# Patient Record
Sex: Male | Born: 1970 | Race: Black or African American | Hispanic: No | Marital: Single
Health system: Midwestern US, Community
[De-identification: ages and names within clinical notes are randomized; demographics above are authoritative.]

## PROBLEM LIST (undated history)

## (undated) DIAGNOSIS — M5416 Radiculopathy, lumbar region: Secondary | ICD-10-CM

## (undated) DIAGNOSIS — R1013 Epigastric pain: Secondary | ICD-10-CM

---

## 2015-06-29 DIAGNOSIS — S20212A Contusion of left front wall of thorax, initial encounter: Secondary | ICD-10-CM
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
## 2015-06-30 ENCOUNTER — Inpatient Hospital Stay
Admit: 2015-06-30 | Discharge: 2015-06-30 | Disposition: A | Discharge status: Home / Self Care | Payer: PRIVATE HEALTH INSURANCE | Attending: Emergency Medicine

## 2015-06-30 ENCOUNTER — Emergency Department: Admit: 2015-06-30 | Payer: PRIVATE HEALTH INSURANCE | Primary: Family Medicine

## 2015-06-30 MED ORDER — NALBUPHINE 10 MG/ML INJECTION
10 mg/mL | INTRAMUSCULAR | Status: DC
Start: 2015-06-30 — End: 2015-06-30

## 2015-06-30 MED ORDER — TRAMADOL 50 MG TAB
50 mg | ORAL | Status: DC
Start: 2015-06-30 — End: 2015-06-30

## 2015-06-30 MED ORDER — TRAMADOL 50 MG TAB
50 mg | ORAL_TABLET | Freq: Four times a day (QID) | ORAL | 0 refills | Status: DC | PRN
Start: 2015-06-30 — End: 2017-07-24

## 2015-06-30 MED FILL — NALBUPHINE 10 MG/ML INJECTION: 10 mg/mL | INTRAMUSCULAR | Qty: 1

## 2015-06-30 NOTE — ED Notes (Signed)
Pt. Refusing nubain and tramadol
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
## 2015-06-30 NOTE — ED Notes (Signed)
I have reviewed discharge instructions with the patient.  The patient verbalized understanding. Patient armband removed and shredded Pt. Refusing tramadol RX, requesting Percocets instead. Dr. Bertram GalaAnglin at bedside, aware of situation. Pt. Walked independently out of ED with discharge papers.

## 2015-07-07 ENCOUNTER — Emergency Department: Admit: 2015-07-07 | Payer: Self-pay | Primary: Family Medicine

## 2015-07-07 ENCOUNTER — Inpatient Hospital Stay
Admit: 2015-07-07 | Discharge: 2015-07-07 | Disposition: A | Discharge status: Home / Self Care | Payer: Self-pay | Attending: Emergency Medicine

## 2015-07-07 DIAGNOSIS — M545 Low back pain: Secondary | ICD-10-CM

## 2015-07-07 LAB — URINE MICROSCOPIC ONLY
RBC: 0 /hpf (ref 0–5)
WBC: 0 /hpf (ref 0–4)

## 2015-07-07 LAB — CBC WITH AUTOMATED DIFF
ABS. BASOPHILS: 0 10*3/uL (ref 0.0–0.1)
ABS. EOSINOPHILS: 0.1 10*3/uL (ref 0.0–0.4)
ABS. LYMPHOCYTES: 3.2 10*3/uL (ref 0.9–3.6)
ABS. MONOCYTES: 0.6 10*3/uL (ref 0.05–1.2)
ABS. NEUTROPHILS: 3.8 10*3/uL (ref 1.8–8.0)
BASOPHILS: 0 % (ref 0–2)
EOSINOPHILS: 2 % (ref 0–5)
HCT: 40.9 % (ref 36.0–48.0)
HGB: 13.8 g/dL (ref 13.0–16.0)
LYMPHOCYTES: 42 % (ref 21–52)
MCH: 29.5 PG (ref 24.0–34.0)
MCHC: 33.7 g/dL (ref 31.0–37.0)
MCV: 87.4 FL (ref 74.0–97.0)
MONOCYTES: 7 % (ref 3–10)
MPV: 9.3 FL (ref 9.2–11.8)
NEUTROPHILS: 49 % (ref 40–73)
PLATELET: 253 10*3/uL (ref 135–420)
RBC: 4.68 M/uL — ABNORMAL LOW (ref 4.70–5.50)
RDW: 14.7 % — ABNORMAL HIGH (ref 11.6–14.5)
WBC: 7.7 10*3/uL (ref 4.6–13.2)

## 2015-07-07 LAB — METABOLIC PANEL, BASIC
Anion gap: 7 mmol/L (ref 3.0–18)
BUN/Creatinine ratio: 10 — ABNORMAL LOW (ref 12–20)
BUN: 11 MG/DL (ref 7.0–18)
CO2: 27 mmol/L (ref 21–32)
Calcium: 8.5 MG/DL (ref 8.5–10.1)
Chloride: 107 mmol/L (ref 100–108)
Creatinine: 1.09 MG/DL (ref 0.6–1.3)
GFR est AA: >60 mL/min/{1.73_m2} (ref >60)
GFR est non-AA: >60 mL/min/{1.73_m2} (ref >60)
Glucose: 101 mg/dL — ABNORMAL HIGH (ref 74–99)
Potassium: 3.7 mmol/L (ref 3.5–5.5)
Sodium: 141 mmol/L (ref 136–145)

## 2015-07-07 LAB — URINALYSIS W/ RFLX MICROSCOPIC
Bilirubin: NEGATIVE
Glucose: NEGATIVE mg/dL
Nitrites: NEGATIVE
Protein: NEGATIVE mg/dL
Specific gravity: 1.03 (ref 1.005–1.030)
Urobilinogen: 1 EU/dL (ref 0.2–1.0)
pH (UA): 5 (ref 5.0–8.0)

## 2015-07-07 MED ORDER — CIPROFLOXACIN 500 MG TAB
500 mg | ORAL_TABLET | Freq: Two times a day (BID) | ORAL | 0 refills | Status: AC
Start: 2015-07-07 — End: 2015-07-14
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
## 2015-07-07 NOTE — ED Notes (Signed)
I have reviewed discharge instructions with the patient.  The patient verbalized understanding.  Patient armband removed and shredded.  Pt is ambulatory with no acute distress noted at this time, the patient is alert, oriented and stable at time of discharge. Vital Signs stable.  Patient is being discharged with 1 prescription at this time.
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
## 2015-07-07 NOTE — ED Notes (Signed)
Pt resting on stretcher with eyes closed. No acute distress noted.  Vital signs stable.Will continue to monitor.

## 2015-07-07 NOTE — ED Notes (Signed)
Patient refused to have MRI done today, "I need to pick my daughter up at 5 am" Risks and Benefits explained to patient, patient verbalized understanding and signed informed refusal form. MRI order will be given to patient along with discharge instructions so that he can schedule the MRI A.S.A.P per provider.

## 2015-07-08 LAB — CULTURE, URINE
Culture result:: NO GROWTH
Culture: NO GROWTH

## 2017-07-24 DIAGNOSIS — T405X2A Poisoning by cocaine, intentional self-harm, initial encounter: Secondary | ICD-10-CM

## 2017-07-24 NOTE — ED Notes (Signed)
Pt C/O of back pain in center and left upper side. States it has had it for awhile but has gotten worse. He states he is having a bladder procedure soon, could not remember name of procedure. States they saw abnormalities on MRI. Pt states urinary frequency as well seeing some blood in his urine. Pt stated he had information that was sensitive and then stated he had had a previous attempt of overdosing on Heroin. Pt then said he had thoughts of punching people when asking if he had thoughts of harming others. Pt states he has been under stress and became tearful. Dr. Clerance LavBlanford aware. Pt in low bed and on BP and Pulse Ox monitors.
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
## 2017-07-24 NOTE — ED Triage Notes (Signed)
Pt C/O of back pain in center and left upper side. States it has had it for awhile but has gotten worse. He states he is having a bladder procedure soon, could not remember name of procedure. States they saw abnormalities on MRI. Pt states urinary frequency as well seeing some blood in his urine. Pt stated he had information that was sensitive and then stated he had had a previous attempt of overdosing on Heroin. Pt then said he had thoughts of punching people when asking if he had thoughts of harming others. Pt states he has been under stress and became tearful. Dr. Blanford aware. Pt in low bed and on BP and Pulse Ox monitors.
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
## 2017-07-25 ENCOUNTER — Inpatient Hospital Stay
Admit: 2017-07-25 | Discharge: 2017-07-25 | Disposition: A | Discharge status: Other Acute Inpatient Hospital | Payer: PRIVATE HEALTH INSURANCE | Attending: Emergency Medicine

## 2017-07-25 LAB — CBC WITH AUTOMATED DIFF
BASOPHILS: 0.5 % (ref 0–3)
EOSINOPHILS: 1.9 % (ref 0–5)
HCT: 47.7 % (ref 37.0–50.0)
HGB: 15.8 gm/dl (ref 12.4–17.2)
IMMATURE GRANULOCYTES: 0.1 % (ref 0.0–3.0)
LYMPHOCYTES: 48.6 % — ABNORMAL HIGH (ref 28–48)
MCH: 29.2 pg (ref 23.0–34.6)
MCHC: 33.1 gm/dl (ref 30.0–36.0)
MCV: 88 fL (ref 80.0–98.0)
MONOCYTES: 4.9 % (ref 1–13)
MPV: 9.1 fL (ref 6.0–10.0)
NEUTROPHILS: 44 % (ref 34–64)
NRBC: 0 (ref 0–0)
PLATELET: 232 10*3/uL (ref 140–450)
RBC: 5.42 M/uL (ref 3.80–5.70)
RDW-SD: 47.8 — ABNORMAL HIGH (ref 35.1–43.9)
WBC: 7.5 10*3/uL (ref 4.0–11.0)

## 2017-07-25 LAB — METABOLIC PANEL, BASIC
Anion gap: 8 mmol/L (ref 5–15)
BUN: 12 mg/dl (ref 7–25)
CO2: 28 mEq/L (ref 21–32)
Calcium: 9 mg/dl (ref 8.5–10.1)
Chloride: 106 mEq/L (ref 98–107)
Creatinine: 1.3 mg/dl (ref 0.6–1.3)
GFR est AA: >60
GFR est non-AA: >60
Glucose: 91 mg/dl (ref 74–106)
Potassium: 4.1 mEq/L (ref 3.5–5.1)
Sodium: 141 mEq/L (ref 136–145)

## 2017-07-25 LAB — DRUG SCREEN, URINE
Amphetamine: NEGATIVE
Amphetamines: NEGATIVE
Barbiturates: NEGATIVE
Barbiturates: NEGATIVE
Benzodiazapines: NEGATIVE
Benzodiazepines: NEGATIVE
Cocaine: NEGATIVE
Cocaine: NEGATIVE
Marijuana: POSITIVE — AB
Marijuana: POSITIVE — AB
Methadone: NEGATIVE
Methadone: NEGATIVE
Opiates: NEGATIVE
Opiates: NEGATIVE
Phencyclidine: NEGATIVE
Phencyclidine: NEGATIVE

## 2017-07-25 LAB — POC URINE MACROSCOPIC
Bilirubin, Urine: NEGATIVE
Bilirubin: NEGATIVE
Glucose, Ur: NEGATIVE mg/dl
Glucose: NEGATIVE mg/dl
Leukocyte Esterase, Urine: NEGATIVE
Leukocyte Esterase: NEGATIVE
Nitrite, Urine: NEGATIVE
Nitrites: NEGATIVE
Protein, UA: NEGATIVE mg/dl
Protein: NEGATIVE mg/dl
Specific Gravity, UA: 1.025 (ref 1.005–1.030)
Specific gravity: 1.025 (ref 1.005–1.030)
Urobilinogen, UA, POCT: 0.2 EU/dl (ref 0.0–1.0)
Urobilinogen: 0.2 EU/dl (ref 0.0–1.0)
pH (UA): 5.5 (ref 5–9)
pH, UA: 5.5 (ref 5–9)

## 2017-07-25 LAB — EKG, 12 LEAD, INITIAL
Atrial Rate: 58 {beats}/min
Calculated P Axis: 26 degrees
Calculated R Axis: -12 degrees
Calculated T Axis: -2 degrees
P-R Interval: 160 ms
Q-T Interval: 412 ms
QRS Duration: 90 ms
QTC Calculation (Bezet): 404 ms
Ventricular Rate: 58 {beats}/min

## 2017-07-25 LAB — SALICYLATE
Salicyclic Acid: 1.9 mg/dl — ABNORMAL LOW (ref 2.8–20.0)
Salicylate: 1.9 mg/dl — ABNORMAL LOW (ref 2.8–20.0)

## 2017-07-25 LAB — ETHYL ALCOHOL
ALCOHOL(ETHYL),SERUM: 4 mg/dl (ref 0.0–9.0)
Ethyl Alcohol: 4 mg/dl (ref 0.0–9.0)

## 2017-07-25 LAB — ACETAMINOPHEN: Acetaminophen level: <2 ug/mL — ABNORMAL LOW (ref 10.0–30.0)

## 2017-07-25 LAB — TSH 3RD GENERATION
TSH: 4.29 u[IU]/mL — ABNORMAL HIGH (ref 0.358–3.740)
TSH: 4.29 u[IU]/mL — ABNORMAL HIGH (ref 0.358–3.740)

## 2017-07-25 LAB — T4, FREE
FREE T4, FT4T: 0.78 ng/dl (ref 0.76–1.46)
Free T4: 0.78 ng/dl (ref 0.76–1.46)

## 2017-07-25 LAB — CBC WITH AUTO DIFFERENTIAL
Basophils %: 0.5 % (ref 0–3)
Eosinophils %: 1.9 % (ref 0–5)
Hematocrit: 47.7 % (ref 37.0–50.0)
Hemoglobin: 15.8 gm/dl (ref 12.4–17.2)
Immature Granulocytes: 0.1 % (ref 0.0–3.0)
Lymphocytes %: 48.6 % — ABNORMAL HIGH (ref 28–48)
MCH: 29.2 pg (ref 23.0–34.6)
MCHC: 33.1 gm/dl (ref 30.0–36.0)
MCV: 88 fL (ref 80.0–98.0)
MPV: 9.1 fL (ref 6.0–10.0)
Monocytes %: 4.9 % (ref 1–13)
Neutrophils %: 44 % (ref 34–64)
Nucleated RBCs: 0 (ref 0–0)
Platelets: 232 10*3/uL (ref 140–450)
RBC: 5.42 M/uL (ref 3.80–5.70)
RDW-SD: 47.8 — ABNORMAL HIGH (ref 35.1–43.9)
WBC: 7.5 10*3/uL (ref 4.0–11.0)

## 2017-07-25 LAB — EKG 12-LEAD
Atrial Rate: 58 {beats}/min
P Axis: 26 degrees
P-R Interval: 160 ms
Q-T Interval: 412 ms
QRS Duration: 90 ms
QTc Calculation (Bazett): 404 ms
R Axis: -12 degrees
T Axis: -2 degrees
Ventricular Rate: 58 {beats}/min

## 2017-07-25 LAB — BASIC METABOLIC PANEL
Anion Gap: 8 mmol/L (ref 5–15)
BUN: 12 mg/dl (ref 7–25)
CO2: 28 mEq/L (ref 21–32)
Calcium: 9 mg/dl (ref 8.5–10.1)
Chloride: 106 mEq/L (ref 98–107)
Creatinine: 1.3 mg/dl (ref 0.6–1.3)
EGFR IF NonAfrican American: >60
GFR African American: >60
Glucose: 91 mg/dl (ref 74–106)
Potassium: 4.1 mEq/L (ref 3.5–5.1)
Sodium: 141 mEq/L (ref 136–145)

## 2017-07-25 LAB — ACETAMINOPHEN LEVEL: Acetaminophen Level: <2 ug/mL — ABNORMAL LOW (ref 10.0–30.0)

## 2017-07-25 MED ORDER — KETOROLAC TROMETHAMINE 30 MG/ML INJECTION
30 mg/mL (1 mL) | INTRAMUSCULAR | Status: AC
Start: 2017-07-25 — End: 2017-07-25
  Administered 2017-07-25: 07:00:00 via INTRAVENOUS

## 2017-07-25 MED FILL — KETOROLAC TROMETHAMINE 30 MG/ML INJECTION: 30 mg/mL (1 mL) | INTRAMUSCULAR | Qty: 1

## 2017-07-25 NOTE — Progress Notes (Signed)
MENTAL HEALTH UPDATE: [facility] the care of Dr. Milly Jakobunninghman. The number to call report is [phone]. ER Attending Dr. Raynald KempHsu and nursing staff notified.

## 2017-07-25 NOTE — ED Notes (Signed)
Dr. Dorthula RueSawhney called for information about pt. Pt in low bed and on monitors.

## 2017-07-25 NOTE — ED Notes (Signed)
12:20 PM  07/25/17     Discharge instructions given to patient (name) with verbalization of understanding. Patient accompanied by medical transport.  Patient discharged with the following prescriptions (none).  Patient discharged and taken to [facility] via medical transport.     Teofilo Pod, RN

## 2017-07-25 NOTE — ED Notes (Signed)
[facility] telpsych doctor [facility] as [facility] have recommended involuntary placement for patient due to SI/HI.

## 2017-07-25 NOTE — ED Notes (Signed)
Patient belongings have been inventoried by security and placed in locker in west wing labeled for bed 27.

## 2017-07-25 NOTE — ED Notes (Signed)
SBAR given to Nash-Finch CompanyLynn, Charity fundraiserN. Pt in low bed and on monitors.

## 2017-07-25 NOTE — Progress Notes (Signed)
[facility]: 47 YO AA Male presents to the ED with complaints of lower back/leg pain. During the medical evaluation, Pt reported he has had ongoing low back pain with right-sided sciatica since June of 2017. He reported feeling depressed because of the chronic pain and admitted to a recent suicide attempt by overdosing on heroin and cocaine. Pt reported the depression has gotten worse as his suicide attempt was unsuccessful. Psych consult requested and telepsych was utilized as no crisis clinician was on staff overnight. Plan: Case discussed with ER Attending Dr. Delton SeeNelson. Tele psychiatrist Dr. Zada GirtVictor Sawhney evaluated the Pt and recommended involuntary in-patient psychiatric hospitalization to treat the Pt for SI/HI. [facility] clinician Collier SalinaLolita Lee contacted to complete a pre-screen to determine if a TDO would be supported, but that was not completed prior to the start of this writer's shift as the daytime crisis clinician. This Clinical research associatewriter spoke with [name] to discuss the tele psychiatric's recommendation for in-pt psychiatric hospitalization for acute psychiatric stabilization, further psychiatric observation, evaluation, and treatment. Pt reports being voluntary for such treatment at this time. This Clinical research associatewriter to contact area facilities in an attempt to locate an appropriate placement for this voluntary referral. Pt to remain in the ED until transferred to a higher level of care. Continue video monitoring and periodic checks for safety. ER Attending notified and in agreement with plan.

## 2017-07-25 NOTE — ED Notes (Signed)
Report given to Megan RN

## 2017-07-25 NOTE — ED Notes (Signed)
Assumed care, patient is alert and awake, watching tv, requested coffee and given to him.  Helmut Musterlicia with mental health just finished assesing patient.

## 2017-07-25 NOTE — ED Notes (Signed)
11:05 AM  07/25/17     Attempted to call report to Lawanna KobusAngel, RN at [facility] ([facility]urse) who is unable to take report at this time, and asked that I call back in ten minutes.  Will follow up shortly; Addison BaileyStephanie Parker, RN, CN aware         Teofilo PodMegan Kinlaw, RN

## 2017-07-25 NOTE — ED Notes (Signed)
Pt requesting pillow. Pt provided pillow. Pt in low bed and on monitors.

## 2017-07-25 NOTE — ED Notes (Signed)
Patient has been changed into paper gown and security has been notified for belongings. telepsych has been arranged, paper faxed, and machine is set up outside of patient's room.

## 2017-07-25 NOTE — ED Notes (Signed)
 TRANSFER - OUT REPORT:    Verbal report given to [facility], Charity fundraiser (name) on PRATHER FAILLA  being transferred to [facility] (unit) for routine progression of care       Report consisted of patient's Situation, Background, Assessment and   Recommendations(SBAR).     Information from the following report(s) SBAR was reviewed with the receiving nurse.    Lines:   Peripheral IV 07/24/17 Right Antecubital (Active)   Site Assessment Clean, dry, & intact 07/24/2017 11:39 PM   Phlebitis Assessment 0 07/24/2017 11:39 PM   Infiltration Assessment 0 07/24/2017 11:39 PM   Dressing Status Clean, dry, & intact 07/24/2017 11:39 PM   Dressing Type Transparent 07/24/2017 11:39 PM   Alcohol Cap Used Yes 07/24/2017 11:39 PM        Opportunity for questions and clarification was provided.      Patient transported with:   Medical Transport upon their arrival

## 2017-07-25 NOTE — ED Notes (Signed)
[facility] by Dr Tomasa Randunningham.  No further issues.  Dianna RossettiHelen Idonia Zollinger MD

## 2017-07-25 NOTE — ED Notes (Signed)
Pt talking to Dr. Dorthula RueSawhney at this time via video from computer. Pt in low bed and on monitors.

## 2017-07-25 NOTE — ED Notes (Signed)
I accepted the patient at change of shift from Dr. Luciano CutterHahn and physician assistant Vonna KotykKristen K Blandford.  The patient has been medically cleared by my colleagues for psychiatric treatment.  We got a telemetry psych consult back at 5:00 in the morning.  They recommend involuntary commitment.  Up to this point the patient has been voluntary however.  Further recommendations include Seroquel 50 mg p.o. nightly for mood stabilization, anxiety, sleep, and appetite symptoms.  They have also recommended Zyprexa 5 mg p.o./IM every 8 hours as needed acute agitation/acute psychosis.  Psychiatry also request TSH and free T4.  Additionally, they recommend lipid panel and hemoglobin A1c.  I do not feel that either of these are warranted in the emergency room management of this patient.      Our crisis clinician will assist in the morning with bed search.  Care of the patient is being turned over to my colleague, Dr. Dianna RossettiHelen Hsu at this time.    6:34 AM  Telemetry psychiatry also requested a baseline EKG.  I reviewed the EKG myself.  It shows sinus bradycardia with a rate of 49.  Peer intervals 164 ms.  QRS is narrow complex at 92 ms.  QT interval corrected is normal at 383 ms.  I do not see any acute ST segment elevation.  Nonspecific T wave changes inferiorly in leads III and aVF.

## 2017-07-25 NOTE — ED Notes (Signed)
Bedside shift change report given to Larita FifeLynn (Cabin crewoncoming nurse) by Chip BoerVicki (offgoing nurse). Report included the following information SBAR.

## 2017-07-25 NOTE — ED Notes (Signed)
Dr. Sawhney called for information about pt. Pt in low bed and on monitors.

## 2017-07-25 NOTE — ED Notes (Signed)
[facility] accepted to [facility] by Dr Cunningham.  No further issues.  Lashala Laser MD

## 2017-07-25 NOTE — ED Notes (Signed)
Report from telpsych doctor given to Lolita from [facility] as they have recommended involuntary placement for patient due to SI/HI.

## 2017-07-25 NOTE — Progress Notes (Signed)
MENTAL HEALTH UPDATE: Pt has been accepted to [facility] under the care of Dr. Cunninghman. The number to call report is [phone]. ER Attending Dr. Hsu and nursing staff notified.

## 2017-07-25 NOTE — ED Notes (Signed)
Pt talking to Dr. Sawhney at this time via video from computer. Pt in low bed and on monitors.

## 2017-07-25 NOTE — ED Notes (Signed)
12:20 PM  07/25/17     Discharge instructions given to patient (name) with verbalization of understanding. Patient accompanied by medical transport.  Patient discharged with the following prescriptions (none).  Patient discharged and taken to [facility] via medical transport.     Megan Kinlaw, RN

## 2017-07-25 NOTE — ED Notes (Signed)
11:05 AM  07/25/17     Attempted to call report to Angel, RN at [facility] (nurse) who is unable to take report at this time, and asked that I call back in ten minutes.  Will follow up shortly; Stephanie Parker, RN, CN aware         Megan Kinlaw, RN

## 2017-07-25 NOTE — ED Notes (Signed)
TRANSFER - OUT REPORT:    Verbal report given to Angel, RN (name) on Jim Shaw  being transferred to [facility] (unit) for routine progression of care       Report consisted of patient???s Situation, Background, Assessment and   Recommendations(SBAR).     Information from the following report(s) SBAR was reviewed with the receiving nurse.    Lines:   Peripheral IV 07/24/17 Right Antecubital (Active)   Site Assessment Clean, dry, & intact 07/24/2017 11:39 PM   Phlebitis Assessment 0 07/24/2017 11:39 PM   Infiltration Assessment 0 07/24/2017 11:39 PM   Dressing Status Clean, dry, & intact 07/24/2017 11:39 PM   Dressing Type Transparent 07/24/2017 11:39 PM   Alcohol Cap Used Yes 07/24/2017 11:39 PM        Opportunity for questions and clarification was provided.      Patient transported with:   Medical Transport upon their arrival

## 2017-07-25 NOTE — ED Notes (Signed)
SBAR given to Lynn, RN. Pt in low bed and on monitors.

## 2017-07-25 NOTE — ED Notes (Signed)
Pt requesting pillow. Pt provided pillow. Pt in low bed and on monitors.

## 2017-07-25 NOTE — ED Notes (Addendum)
I accepted the patient at change of shift from Dr. Hahn and physician assistant Kristen K Blandford.  The patient has been medically cleared by my colleagues for psychiatric treatment.  We got a telemetry psych consult back at 5:00 in the morning.  They recommend involuntary commitment.  Up to this point the patient has been voluntary however.  Further recommendations include Seroquel 50 mg p.o. nightly for mood stabilization, anxiety, sleep, and appetite symptoms.  They have also recommended Zyprexa 5 mg p.o./IM every 8 hours as needed acute agitation/acute psychosis.  Psychiatry also request TSH and free T4.  Additionally, they recommend lipid panel and hemoglobin A1c.  I do not feel that either of these are warranted in the emergency room management of this patient.      Our crisis clinician will assist in the morning with bed search.  Care of the patient is being turned over to my colleague, Dr. Helen Hsu at this time.    6:34 AM  Telemetry psychiatry also requested a baseline EKG.  I reviewed the EKG myself.  It shows sinus bradycardia with a rate of 49.  Peer intervals 164 ms.  QRS is narrow complex at 92 ms.  QT interval corrected is normal at 383 ms.  I do not see any acute ST segment elevation.  Nonspecific T wave changes inferiorly in leads III and aVF.

## 2017-07-25 NOTE — Progress Notes (Signed)
[facility] ER CRISIS MENTAL HEALTH: 47 YO AA Male presents to the ED with complaints of lower back/leg pain. During the medical evaluation, Pt reported he has had ongoing low back pain with right-sided sciatica since June of 2017. He reported feeling depressed because of the chronic pain and admitted to a recent suicide attempt by overdosing on heroin and cocaine. Pt reported the depression has gotten worse as his suicide attempt was unsuccessful. Psych consult requested and telepsych was utilized as no crisis clinician was on staff overnight. Plan: Case discussed with ER Attending Dr. Nelson. Tele psychiatrist Dr. Victor Sawhney evaluated the Pt and recommended involuntary in-patient psychiatric hospitalization to treat the Pt for SI/HI. [facility] clinician Lolita Lee contacted to complete a pre-screen to determine if a TDO would be supported, but that was not completed prior to the start of this writer's shift as the daytime crisis clinician. This writer spoke with Mr. Philipson to discuss the tele psychiatric's recommendation for in-pt psychiatric hospitalization for acute psychiatric stabilization, further psychiatric observation, evaluation, and treatment. Pt reports being voluntary for such treatment at this time. This writer to contact area facilities in an attempt to locate an appropriate placement for this voluntary referral. Pt to remain in the ED until transferred to a higher level of care. Continue video monitoring and periodic checks for safety. ER Attending notified and in agreement with plan.

## 2017-07-25 NOTE — ED Notes (Signed)
Patient has been changed into paper gown and security has been notified for belongings. telepsych has been arranged, paper faxed, and machine is set up outside of patient's room.

## 2017-07-25 NOTE — ED Notes (Signed)
Assumed care, patient is alert and awake, watching tv, requested coffee and given to him.  Alicia with mental health just finished assesing patient.

## 2017-07-25 NOTE — ED Notes (Signed)
Patient belongings have been inventoried by security and placed in locker in west wing labeled for bed 27.

## 2017-07-25 NOTE — ED Notes (Signed)
Bedside shift change report given to Lynn (oncoming nurse) by Vicki (offgoing nurse). Report included the following information SBAR.

## 2017-08-11 DIAGNOSIS — F329 Major depressive disorder, single episode, unspecified: Secondary | ICD-10-CM

## 2017-08-11 NOTE — ED Provider Notes (Signed)
[facility]  Emergency Department Treatment Report        Patient: Jim Shaw Age: 47 y.o. Sex: male    Date of Birth: [birth year] Admit Date: 08/11/2017 PCP: None   MRN: [id number]  CSN: [id number]     Room: ER12/ER12 Time Dictated: 8:50 PM      Attending MD: Smitty Cords, MD  APC:  Vonna Kotyk, NP-C    Chief Complaint   Chief Complaint   Patient presents with   ??? Back Pain   ??? Urinary Frequency   ??? Mental Health Problem       History of Present Illness   47 y.o. male presented to the emergency room with concern for chronic back pain, chronic urinary urgency.  States that these have been going on since 2017 when he had a back injury at work.  He has not been able to work since.  He states that he is currently living with a friend stenosis to live in his truck.  He states that he is been outside a lot recently and is feeling dehydrated and sometimes is seeing a white spots in his vision.  No headaches.  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  He went to [facility].  He is requesting to return there because he feels that he is overwhelmed with her life, does not wish to be alive.  He is currently voluntary.  Denies drug or alcohol use today.      Review of Systems   Review of Systems   Constitutional: Positive for malaise/fatigue. Negative for chills and fever.   HENT: Negative for congestion, ear pain and sore throat.    Eyes:        Seeing white spots occasionally   Respiratory: Negative for cough, shortness of breath and wheezing.    Cardiovascular: Negative for chest pain and palpitations.   Gastrointestinal: Negative for abdominal pain, constipation, diarrhea, nausea and vomiting.   Genitourinary: Positive for frequency. Negative for flank pain.   Musculoskeletal: Positive for back pain. Negative for myalgias.        Chronic     Skin: Negative for  itching and rash.   Neurological: Positive for focal weakness. Negative for headaches.        Chronic right leg weakness   Psychiatric/Behavioral: Positive for depression and suicidal ideas.       Past Medical/Surgical History     Past Medical History:   Diagnosis Date   ??? Costochondritis      Past Surgical History:   Procedure Laterality Date   ??? HX ORTHOPAEDIC      Surgery on knee (right knee)        Social History     Social History     Socioeconomic History   ??? Marital status: SINGLE     Spouse name: Not on file   ??? Number of children: Not on file   ??? Years of education: Not on file   ??? Highest education level: Not on file   Occupational History   ??? Not on file   Social Needs   ??? Financial resource strain: Not on file   ??? Food insecurity:     Worry: Not on file     Inability: Not on file   ??? Transportation needs:     Medical: Not on file  Non-medical: Not on file   Tobacco Use   ??? Smoking status: Former Smoker   ??? Smokeless tobacco: Never Used   Substance and Sexual Activity   ??? Alcohol use: Yes     Comment: rearly   ??? Drug use: Yes     Types: Marijuana   ??? Sexual activity: Not on file   Lifestyle   ??? Physical activity:     Days per week: Not on file     Minutes per session: Not on file   ??? Stress: Not on file   Relationships   ??? Social connections:     Talks on phone: Not on file     Gets together: Not on file     Attends religious service: Not on file     Active member of club or organization: Not on file     Attends meetings of clubs or organizations: Not on file     Relationship status: Not on file   ??? Intimate partner violence:     Fear of current or ex partner: Not on file     Emotionally abused: Not on file     Physically abused: Not on file     Forced sexual activity: Not on file   Other Topics Concern   ??? Not on file   Social History Narrative   ??? Not on file       Family History   History reviewed. No pertinent family history.    Current Medications     None       Allergies     Allergies   Allergen  Reactions   ??? Aspirin Other (comments)   ??? Ibuprofen Rash       Physical Exam      ED Triage Vitals   ED Encounter Vitals Group      BP 08/11/17 2019 137/88      Pulse (Heart Rate) 08/11/17 2019 85      Resp Rate 08/11/17 2019 14      Temp 08/11/17 2019 98.9 ??F (37.2 ??C)      Temp src --       O2 Sat (%) 08/11/17 2019 98 %      Weight 08/11/17 2014 280 lb      Height 08/11/17 2014 6'     Physical Exam   Constitutional: He is oriented to person, place, and time and well-developed, well-nourished, and in no distress.   HENT:   Head: Normocephalic and atraumatic.   Right Ear: External ear normal.   Left Ear: External ear normal.   Eyes: Pupils are equal, round, and reactive to light. Conjunctivae and EOM are normal.   Neck: Normal range of motion. Neck supple.   Cardiovascular: Normal rate, regular rhythm, normal heart sounds and intact distal pulses.   Pulmonary/Chest: Effort normal and breath sounds normal. No respiratory distress. He has no wheezes.   Abdominal: Soft. Bowel sounds are normal. There is no tenderness.   Musculoskeletal: He exhibits no edema.   Chronic low back pain   Neurological: He is alert and oriented to person, place, and time. GCS score is 15.   Skin: Skin is warm and dry. No erythema.   Psychiatric:   Tearful, depressed, SI       Impression and Management Plan   Patient with chronic back pain after an injury in 2017 also chronic urinary frequency.  Seen by urology.  Currently feeling depressed and suicidal requesting transfer [facility].  Will  obtain psychiatric screening labs.    Diagnostic Studies   Lab:   Recent Results (from the past 12 hour(s))   POC URINE MACROSCOPIC    Collection Time: 08/11/17  9:09 PM   Result Value Ref Range    Glucose Negative NEGATIVE,Negative mg/dl    Bilirubin Negative NEGATIVE,Negative      Ketone Negative NEGATIVE,Negative mg/dl    Specific gravity >=1.610 1.005 - 1.030      Blood Large (A) NEGATIVE,Negative      pH (UA) 5.5 5 - 9      Protein  Negative NEGATIVE,Negative mg/dl    Urobilinogen 0.2 0.0 - 1.0 EU/dl    Nitrites Negative NEGATIVE,Negative      Leukocyte Esterase Negative NEGATIVE,Negative      Color Yellow      Appearance Clear     ETHYL ALCOHOL    Collection Time: 08/11/17  9:15 PM   Result Value Ref Range    ALCOHOL(ETHYL),SERUM <3.0 0.0 - 9.0 mg/dl   DRUG SCREEN, URINE    Collection Time: 08/11/17  9:15 PM   Result Value Ref Range    Amphetamine NEGATIVE NEGATIVE      Barbiturates NEGATIVE NEGATIVE      Benzodiazepines NEGATIVE NEGATIVE      Cocaine NEGATIVE NEGATIVE      Marijuana POSITIVE (A) NEGATIVE      Methadone NEGATIVE NEGATIVE      Opiates NEGATIVE NEGATIVE      Phencyclidine NEGATIVE NEGATIVE     CBC WITH AUTOMATED DIFF    Collection Time: 08/11/17  9:15 PM   Result Value Ref Range    WBC 9.7 4.0 - 11.0 1000/mm3    RBC 5.34 3.80 - 5.70 M/uL    HGB 15.7 12.4 - 17.2 gm/dl    HCT 96.0 45.4 - 09.8 %    MCV 88.2 80.0 - 98.0 fL    MCH 29.4 23.0 - 34.6 pg    MCHC 33.3 30.0 - 36.0 gm/dl    PLATELET 119 147 - 829 1000/mm3    MPV 9.2 6.0 - 10.0 fL    RDW-SD 46.5 (H) 35.1 - 43.9      NRBC 0 0 - 0      IMMATURE GRANULOCYTES 0.1 0.0 - 3.0 %    NEUTROPHILS 49.3 34 - 64 %    LYMPHOCYTES 43.8 28 - 48 %    MONOCYTES 5.1 1 - 13 %    EOSINOPHILS 1.3 0 - 5 %    BASOPHILS 0.4 0 - 3 %   METABOLIC PANEL, BASIC    Collection Time: 08/11/17  9:15 PM   Result Value Ref Range    Sodium 140 136 - 145 mEq/L    Potassium 3.8 3.5 - 5.1 mEq/L    Chloride 107 98 - 107 mEq/L    CO2 28 21 - 32 mEq/L    Glucose 88 74 - 106 mg/dl    BUN 12 7 - 25 mg/dl    Creatinine 1.4 (H) 0.6 - 1.3 mg/dl    GFR est AA >56      GFR est non-AA 58      Calcium 9.4 8.5 - 10.1 mg/dl    Anion gap 5 5 - 15 mmol/L   ACETAMINOPHEN    Collection Time: 08/11/17  9:15 PM   Result Value Ref Range    Acetaminophen level <2.0 (L) 10.0 - 30.0 mcg/ml   SALICYLATE    Collection Time: 08/11/17  9:15 PM   Result Value Ref  Range    Salicylate <1.7 (L) 2.8 - 20.0 mg/dl     Labs Reviewed   DRUG SCREEN,  URINE - Abnormal; Notable for the following components:       Result Value    Marijuana POSITIVE (*)     All other components within normal limits   CBC WITH AUTOMATED DIFF - Abnormal; Notable for the following components:    RDW-SD 46.5 (*)     All other components within normal limits   METABOLIC PANEL, BASIC - Abnormal; Notable for the following components:    Creatinine 1.4 (*)     All other components within normal limits   ACETAMINOPHEN - Abnormal; Notable for the following components:    Acetaminophen level <2.0 (*)     All other components within normal limits   SALICYLATE - Abnormal; Notable for the following components:    Salicylate <1.7 (*)     All other components within normal limits   POC URINE MACROSCOPIC - Abnormal; Notable for the following components:    Blood Large (*)     All other components within normal limits   ETHYL ALCOHOL       Imaging:    No results found.      ED Course   Patient remained stable in the emergency department.  Psychiatric screening labs were obtained all showed no acute abnormalities.  He was medically cleared after results were obtained.    He was evaluated by the crisis clinicians and was found to be appropriate for inpatient psychiatric admission.  Patient is voluntary at this time.  Bed search was underway.  He was evaluated by [facility] psychiatric on the phone.  However he hung up on them stating that they are asking him to many questions.  They have no declined to accept this patient for admission.  Bed search remains underway at this time.  Patient's placement will be determined after available bed is found for this patient.  He currently remains voluntary for inpatient treatment.  Medications   traMADol (ULTRAM) tablet 50 mg (50 mg Oral Given 08/11/17 2050)   diazePAM (VALIUM) tablet 5 mg (5 mg Oral Given 08/12/17 0215)   LORazepam (ATIVAN) tablet 1 mg (1 mg Oral Given 08/12/17 0252)   HYDROcodone-acetaminophen (NORCO) 5-325 mg per tablet 1 Tab (1 Tab Oral  Given 08/12/17 0252)     Attending note by Dr. Vinnie Langton: I have discussed this case with the advanced practice provider. We have reviewed the presentation, pertinent historical and physical exam findings, as well as relevant  findings. I have been available at all times and agree with the management and disposition documented here.  Patient was upset after phone call with [facility] psychiatric crisis worker.  He still insisted on going over there and he wanted to wait to the day staff came and talked to him about his room there and treatment that he would be receiving.  He is still voluntarily wanting to be admitted  Smitty Cords, MD  August 12, 2017        Final Diagnosis       ICD-10-CM ICD-9-CM   1. Suicidal ideation R45.851 V62.84   2. Depression, unspecified depression type F32.9 311   3. Chronic midline low back pain with right-sided sciatica M54.41 724.2    G89.29 724.3     338.29         Disposition   Pending bed availability and placement.  There are no discharge medications for this  patient.      The patient was personally evaluated by myself and Keevon Henney, Wetzel Bjornstad, MD who agrees with the above assessment and plan.    Vonna Kotyk, NP-C  August 12, 2017    My signature above authenticates this document and my orders, the final ??  diagnosis (es), discharge prescription (s), and instructions in the Epic ??  record.  If you have any questions please contact [phone].  ??  Nursing notes have been reviewed by the physician/ advanced practice ??  Clinician.    Dragon medical dictation software was used for portions of this report. Unintended voice recognition errors may occur.

## 2017-08-11 NOTE — Progress Notes (Signed)
 Psychiatric Hx:  Pt reports an extensive history of depression, but was unable to identify a formal mental health diagnosis.  Pt reports that he received some mental health services including: individual therapy and medication management; however Pt was guarded and refused to clarify duration and frequency of services.  Moreover, Pt reports that he was hospitalized acutely via [place]  [facility], approximately one week ago.  Substance Hx:  Pt chart reveals a history of substance abuse to include: cocaine and heroin.  MSE:  Pt presents as guarded; he was sitting in a chair in his assigned room versus hospital bed.  The undersigned could not get a good read on orientation, as Pt refused to answer select questions.  Pt presents as intolerant and lacks patience with the assessment process.  Pt refused to confirm or deny current or a history of psychosis.  Risk Assessment:  Pt is endorsing current thoughts of SI and HI (no plan identified).  Pt resides locally by self and reports that he does not have a support system.  Pt further stated, "I need to get away and be by myself or I am going to do something to myself or someone else."  Pt is also unemployed and identified an immense amount of environmental stressors.     Presenting Problem:  Pt presented to the ED with c/o of ongoing low back pain with emphasis on right-sided sciatica, since June of 2017. Per PA's report, Pt reported feeling depressed because of the chronic pain and admitted to a recent suicide attempt by overdosing on heroin and cocaine.  Moreover, PA further reported that Pt reported that the depression has gotten worse as his suicide attempt was unsuccessful.  However, Pt refused to clarify these statements during assessment.  Pt reported, I am here because I need help and I do not feel like answering a whole lot of questions.  Disposition:  Pt reports that he desires to seek inpatient treatment due to endorsing SI and HI in addition to  ongoing depression, due to chronic back pain.  The undersigned consulted with PA. [name] and Dr. Liddie on client's presentation, both agreed that IP treatment is appropriate for Pt at this time.  Plan:  The undersigned will locate an inpatient treatment facility for Pt.

## 2017-08-11 NOTE — ED Notes (Signed)
Chronic back pain since injury in 2017  2018--was losing his balance and falling--pain was terrible     Right leg was numb--feet cold     Does not really sleep at all--because of all of it  Has a bladder problem--can't tell when he has to go  Has urgency issues--going on since 2016--did not know what it was    Has had problems with hot flashes --wakes up sweating--  Going on a long time    Sees white fuzzy dots sometimes, too

## 2017-08-11 NOTE — Progress Notes (Signed)
 Behavioral Health:  Bed search completed on patient:  [name]  [facility] - Fax for consideration [name]). Faxed received and under review.  [facility] - No appropriate beds [name]).  [facility] - Fax for consideration [name]).    [facility] - Fax in the morning for consideration (Dr. Landry).    [facility] - Fax for consideration [name]).    [facility] - Fax for consideration ([name]).      Charge nurse [name] and PA [name] was updated.

## 2017-08-11 NOTE — ED Triage Notes (Signed)
Chronic back pain since injury in 2017  2018--was losing his balance and falling--pain was terrible     Right leg was numb--feet cold     Does not really sleep at all--because of all of it  Has a bladder problem--can't tell when he has to go  Has urgency issues--going on since 2016--did not know what it was    Has had problems with hot flashes --wakes up sweating--  Going on a long time    Sees white fuzzy dots sometimes, too

## 2017-08-11 NOTE — Progress Notes (Signed)
Psychiatric Hx:  Pt reports an extensive history of depression, but was unable to identify a formal mental health diagnosis.  Pt reports that he received some mental health services including: individual therapy and medication management; however Pt was guarded and refused to clarify duration and frequency of services.  Moreover, Pt reports that he was hospitalized acutely via [facility], approximately one week ago.  Substance Hx:  Pt chart reveals a history of substance abuse to include: cocaine and heroin.  MSE:  Pt presents as guarded; he was sitting in a chair in his assigned room versus hospital bed.  The undersigned could not get a good read on orientation, as Pt refused to answer select questions.  Pt presents as intolerant and lacks patience with the assessment process.  Pt refused to confirm or deny current or a history of psychosis.  Risk Assessment:  Pt is endorsing current thoughts of SI and HI (no plan identified).  Pt resides locally by self and reports that he does not have a support system.  Pt further stated, ???I need to get away and be by myself or I am going to do something to myself or someone else.???  Pt is also unemployed and identified an immense amount of environmental stressors.     Presenting Problem:  Pt presented to the ED with c/o of ongoing low back pain with emphasis on right-sided sciatica, since June of 2017. Per PA???s report, Pt reported feeling depressed because of the chronic pain and admitted to a recent suicide attempt by overdosing on heroin and cocaine.  Moreover, PA further reported that Pt reported that the depression has gotten worse as his suicide attempt was unsuccessful.  However, Pt refused to clarify these statements during assessment.  Pt reported, "I am here because I need help and I do not feel like answering a whole lot of questions."  Disposition:  Pt reports that he desires to seek inpatient treatment due  to endorsing SI and HI in addition to ongoing depression, due to chronic back pain.  The undersigned consulted with PA. Baxter HireKristen and Dr. Arvella MerlesKisa on client???s presentation, both agreed that IP treatment is appropriate for Pt at this time.  Plan:  The undersigned will locate an inpatient treatment facility for Pt.

## 2017-08-11 NOTE — ED Provider Notes (Addendum)
[facility] Care  Emergency Department Treatment Report        Patient: Jim Shaw Age: 47 y.o. Sex: male    Date of Birth: 07-22-1970 Admit Date: 08/11/2017 PCP: None   MRN: [id number]  CSN: [id number]     Room: ER12/ER12 Time Dictated: 8:50 PM      Attending MD: Smitty Cords, MD  APC:  Vonna Kotyk, NP-C    Chief Complaint   Chief Complaint   Patient presents with   ??? Back Pain   ??? Urinary Frequency   ??? Mental Health Problem       History of Present Illness   47 y.o. male presented to the emergency room with concern for chronic back pain, chronic urinary urgency.  States that these have been going on since 2017 when he had a back injury at work.  He has not been able to work since.  He states that he is currently living with a friend stenosis to live in his truck.  He states that he is been outside a lot recently and is feeling dehydrated and sometimes is seeing a white spots in his vision.  No headaches.  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  He went to [facility].  He is requesting to return there because he feels that he is overwhelmed with her life, does not wish to be alive.  He is currently voluntary.  Denies drug or alcohol use today.      Review of Systems   Review of Systems   Constitutional: Positive for malaise/fatigue. Negative for chills and fever.   HENT: Negative for congestion, ear pain and sore throat.    Eyes:        Seeing white spots occasionally   Respiratory: Negative for cough, shortness of breath and wheezing.    Cardiovascular: Negative for chest pain and palpitations.   Gastrointestinal: Negative for abdominal pain, constipation, diarrhea, nausea and vomiting.   Genitourinary: Positive for frequency. Negative for flank pain.   Musculoskeletal: Positive for back pain. Negative for myalgias.        Chronic      Skin: Negative for itching and rash.   Neurological: Positive for focal weakness. Negative for headaches.        Chronic right leg weakness   Psychiatric/Behavioral: Positive for depression and suicidal ideas.       Past Medical/Surgical History     Past Medical History:   Diagnosis Date   ??? Costochondritis      Past Surgical History:   Procedure Laterality Date   ??? HX ORTHOPAEDIC      Surgery on knee (right knee)        Social History     Social History     Socioeconomic History   ??? Marital status: SINGLE     Spouse name: Not on file   ??? Number of children: Not on file   ??? Years of education: Not on file   ??? Highest education level: Not on file   Occupational History   ??? Not on file   Social Needs   ??? Financial resource strain: Not on file   ??? Food insecurity:     Worry: Not on file     Inability: Not on file   ??? Transportation needs:     Medical: Not on file  Non-medical: Not on file   Tobacco Use   ??? Smoking status: Former Smoker   ??? Smokeless tobacco: Never Used   Substance and Sexual Activity   ??? Alcohol use: Yes     Comment: rearly   ??? Drug use: Yes     Types: Marijuana   ??? Sexual activity: Not on file   Lifestyle   ??? Physical activity:     Days per week: Not on file     Minutes per session: Not on file   ??? Stress: Not on file   Relationships   ??? Social connections:     Talks on phone: Not on file     Gets together: Not on file     Attends religious service: Not on file     Active member of club or organization: Not on file     Attends meetings of clubs or organizations: Not on file     Relationship status: Not on file   ??? Intimate partner violence:     Fear of current or ex partner: Not on file     Emotionally abused: Not on file     Physically abused: Not on file     Forced sexual activity: Not on file   Other Topics Concern   ??? Not on file   Social History Narrative   ??? Not on file       Family History   History reviewed. No pertinent family history.    Current Medications     None       Allergies      Allergies   Allergen Reactions   ??? Aspirin Other (comments)   ??? Ibuprofen Rash       Physical Exam      ED Triage Vitals   ED Encounter Vitals Group      BP 08/11/17 2019 137/88      Pulse (Heart Rate) 08/11/17 2019 85      Resp Rate 08/11/17 2019 14      Temp 08/11/17 2019 98.9 ??F (37.2 ??C)      Temp src --       O2 Sat (%) 08/11/17 2019 98 %      Weight 08/11/17 2014 280 lb      Height 08/11/17 2014 6'     Physical Exam   Constitutional: He is oriented to person, place, and time and well-developed, well-nourished, and in no distress.   HENT:   Head: Normocephalic and atraumatic.   Right Ear: External ear normal.   Left Ear: External ear normal.   Eyes: Pupils are equal, round, and reactive to light. Conjunctivae and EOM are normal.   Neck: Normal range of motion. Neck supple.   Cardiovascular: Normal rate, regular rhythm, normal heart sounds and intact distal pulses.   Pulmonary/Chest: Effort normal and breath sounds normal. No respiratory distress. He has no wheezes.   Abdominal: Soft. Bowel sounds are normal. There is no tenderness.   Musculoskeletal: He exhibits no edema.   Chronic low back pain   Neurological: He is alert and oriented to person, place, and time. GCS score is 15.   Skin: Skin is warm and dry. No erythema.   Psychiatric:   Tearful, depressed, SI       Impression and Management Plan   Patient with chronic back pain after an injury in 2017 also chronic urinary frequency.  Seen by urology.  Currently feeling depressed and suicidal requesting transfer to [facility].  Will  obtain psychiatric screening labs.    Diagnostic Studies   Lab:   Recent Results (from the past 12 hour(s))   POC URINE MACROSCOPIC    Collection Time: 08/11/17  9:09 PM   Result Value Ref Range    Glucose Negative NEGATIVE,Negative mg/dl    Bilirubin Negative NEGATIVE,Negative      Ketone Negative NEGATIVE,Negative mg/dl    Specific gravity >=1.610 1.005 - 1.030      Blood Large (A) NEGATIVE,Negative       pH (UA) 5.5 5 - 9      Protein Negative NEGATIVE,Negative mg/dl    Urobilinogen 0.2 0.0 - 1.0 EU/dl    Nitrites Negative NEGATIVE,Negative      Leukocyte Esterase Negative NEGATIVE,Negative      Color Yellow      Appearance Clear     ETHYL ALCOHOL    Collection Time: 08/11/17  9:15 PM   Result Value Ref Range    ALCOHOL(ETHYL),SERUM <3.0 0.0 - 9.0 mg/dl   DRUG SCREEN, URINE    Collection Time: 08/11/17  9:15 PM   Result Value Ref Range    Amphetamine NEGATIVE NEGATIVE      Barbiturates NEGATIVE NEGATIVE      Benzodiazepines NEGATIVE NEGATIVE      Cocaine NEGATIVE NEGATIVE      Marijuana POSITIVE (A) NEGATIVE      Methadone NEGATIVE NEGATIVE      Opiates NEGATIVE NEGATIVE      Phencyclidine NEGATIVE NEGATIVE     CBC WITH AUTOMATED DIFF    Collection Time: 08/11/17  9:15 PM   Result Value Ref Range    WBC 9.7 4.0 - 11.0 1000/mm3    RBC 5.34 3.80 - 5.70 M/uL    HGB 15.7 12.4 - 17.2 gm/dl    HCT 96.0 45.4 - 09.8 %    MCV 88.2 80.0 - 98.0 fL    MCH 29.4 23.0 - 34.6 pg    MCHC 33.3 30.0 - 36.0 gm/dl    PLATELET 119 147 - 829 1000/mm3    MPV 9.2 6.0 - 10.0 fL    RDW-SD 46.5 (H) 35.1 - 43.9      NRBC 0 0 - 0      IMMATURE GRANULOCYTES 0.1 0.0 - 3.0 %    NEUTROPHILS 49.3 34 - 64 %    LYMPHOCYTES 43.8 28 - 48 %    MONOCYTES 5.1 1 - 13 %    EOSINOPHILS 1.3 0 - 5 %    BASOPHILS 0.4 0 - 3 %   METABOLIC PANEL, BASIC    Collection Time: 08/11/17  9:15 PM   Result Value Ref Range    Sodium 140 136 - 145 mEq/L    Potassium 3.8 3.5 - 5.1 mEq/L    Chloride 107 98 - 107 mEq/L    CO2 28 21 - 32 mEq/L    Glucose 88 74 - 106 mg/dl    BUN 12 7 - 25 mg/dl    Creatinine 1.4 (H) 0.6 - 1.3 mg/dl    GFR est AA >56      GFR est non-AA 58      Calcium 9.4 8.5 - 10.1 mg/dl    Anion gap 5 5 - 15 mmol/L   ACETAMINOPHEN    Collection Time: 08/11/17  9:15 PM   Result Value Ref Range    Acetaminophen level <2.0 (L) 10.0 - 30.0 mcg/ml   SALICYLATE    Collection Time: 08/11/17  9:15 PM   Result Value Ref  Range    Salicylate <1.7 (L) 2.8 - 20.0 mg/dl      Labs Reviewed   DRUG SCREEN, URINE - Abnormal; Notable for the following components:       Result Value    Marijuana POSITIVE (*)     All other components within normal limits   CBC WITH AUTOMATED DIFF - Abnormal; Notable for the following components:    RDW-SD 46.5 (*)     All other components within normal limits   METABOLIC PANEL, BASIC - Abnormal; Notable for the following components:    Creatinine 1.4 (*)     All other components within normal limits   ACETAMINOPHEN - Abnormal; Notable for the following components:    Acetaminophen level <2.0 (*)     All other components within normal limits   SALICYLATE - Abnormal; Notable for the following components:    Salicylate <1.7 (*)     All other components within normal limits   POC URINE MACROSCOPIC - Abnormal; Notable for the following components:    Blood Large (*)     All other components within normal limits   ETHYL ALCOHOL       Imaging:    No results found.      ED Course   Patient remained stable in the emergency department.  Psychiatric screening labs were obtained all showed no acute abnormalities.  He was medically cleared after results were obtained.    He was evaluated by the crisis clinicians and was found to be appropriate for inpatient psychiatric admission.  Patient is voluntary at this time.  Bed search was underway.  He was [facility].  However he hung up on them stating that they are asking him to many questions.  They have no declined to accept this patient for admission.  Bed search remains underway at this time.  Patient's placement will be determined after available bed is found for this patient.  He currently remains voluntary for inpatient treatment.  Medications   traMADol (ULTRAM) tablet 50 mg (50 mg Oral Given 08/11/17 2050)   diazePAM (VALIUM) tablet 5 mg (5 mg Oral Given 08/12/17 0215)   LORazepam (ATIVAN) tablet 1 mg (1 mg Oral Given 08/12/17 0252)    HYDROcodone-acetaminophen (NORCO) 5-325 mg per tablet 1 Tab (1 Tab Oral Given 08/12/17 0252)     Attending note by Dr. Vinnie LangtonErik Zamir Staples: I have discussed this case with the advanced practice provider. We have reviewed the presentation, pertinent historical and physical exam findings, as well as relevant  findings. I have been available at all times and agree with the management and disposition documented here.  Patient was upset after phone call with [facility] crisis worker.  He still insisted on going over there and he wanted to wait to the day staff came and talked to him about his room there and treatment that he would be receiving.  He is still voluntarily wanting to be admitted  Smitty CordsErik H Jeralynn Vaquera, MD  August 12, 2017        Final Diagnosis       ICD-10-CM ICD-9-CM   1. Suicidal ideation R45.851 V62.84   2. Depression, unspecified depression type F32.9 311   3. Chronic midline low back pain with right-sided sciatica M54.41 724.2    G89.29 724.3     338.29         Disposition   Pending bed availability and placement.  There are no discharge medications for this  patient.      The patient was personally evaluated by myself and Terrye Dombrosky, Wetzel Bjornstad, MD who agrees with the above assessment and plan.    Vonna Kotyk, NP-C  August 12, 2017    My signature above authenticates this document and my orders, the final ??  diagnosis (es), discharge prescription (s), and instructions in the Epic ??  record.  If you have any questions please contact [phone].  ??  Nursing notes have been reviewed by the physician/ advanced practice ??  Clinician.    Dragon medical dictation software was used for portions of this report. Unintended voice recognition errors may occur.

## 2017-08-11 NOTE — Progress Notes (Signed)
Behavioral Health:  Bed search completed on patient:  [facility] ??? Fax for consideration (Nikki). Faxed received and under review.  [facility] ??? No appropriate beds (Valerie).  [facility] ??? Fax for consideration (Portia).    [facility] - Fax in the morning for consideration (Dr. Ewell).    [facility] - Fax for consideration (Kelly).    [facility] - Fax for consideration ([name]).      Charge nurse Stephanie and PA Kristen was updated.

## 2017-08-12 ENCOUNTER — Inpatient Hospital Stay
Admit: 2017-08-12 | Discharge: 2017-08-13 | Disposition: A | Discharge status: Other Acute Inpatient Hospital | Payer: PRIVATE HEALTH INSURANCE | Attending: Emergency Medicine

## 2017-08-12 LAB — CBC WITH AUTOMATED DIFF
BASOPHILS: 0.4 % (ref 0–3)
EOSINOPHILS: 1.3 % (ref 0–5)
HCT: 47.1 % (ref 37.0–50.0)
HGB: 15.7 gm/dl (ref 12.4–17.2)
IMMATURE GRANULOCYTES: 0.1 % (ref 0.0–3.0)
LYMPHOCYTES: 43.8 % (ref 28–48)
MCH: 29.4 pg (ref 23.0–34.6)
MCHC: 33.3 gm/dl (ref 30.0–36.0)
MCV: 88.2 fL (ref 80.0–98.0)
MONOCYTES: 5.1 % (ref 1–13)
MPV: 9.2 fL (ref 6.0–10.0)
NEUTROPHILS: 49.3 % (ref 34–64)
NRBC: 0 (ref 0–0)
PLATELET: 236 10*3/uL (ref 140–450)
RBC: 5.34 M/uL (ref 3.80–5.70)
RDW-SD: 46.5 — ABNORMAL HIGH (ref 35.1–43.9)
WBC: 9.7 10*3/uL (ref 4.0–11.0)

## 2017-08-12 LAB — POC URINE MACROSCOPIC
Bilirubin, Urine: NEGATIVE
Bilirubin: NEGATIVE
Glucose, Ur: NEGATIVE mg/dl
Glucose: NEGATIVE mg/dl
Ketone: NEGATIVE mg/dl
Ketones, Urine: NEGATIVE mg/dl
Leukocyte Esterase, Urine: NEGATIVE
Leukocyte Esterase: NEGATIVE
Nitrite, Urine: NEGATIVE
Nitrites: NEGATIVE
Protein, UA: NEGATIVE mg/dl
Protein: NEGATIVE mg/dl
Specific Gravity, UA: >=1.03 (ref 1.005–1.030)
Specific gravity: >=1.03 (ref 1.005–1.030)
Urobilinogen, UA, POCT: 0.2 EU/dl (ref 0.0–1.0)
Urobilinogen: 0.2 EU/dl (ref 0.0–1.0)
pH (UA): 5.5 (ref 5–9)
pH, UA: 5.5 (ref 5–9)

## 2017-08-12 LAB — METABOLIC PANEL, BASIC
Anion gap: 5 mmol/L (ref 5–15)
BUN: 12 mg/dl (ref 7–25)
CO2: 28 mEq/L (ref 21–32)
Calcium: 9.4 mg/dl (ref 8.5–10.1)
Chloride: 107 mEq/L (ref 98–107)
Creatinine: 1.4 mg/dl — ABNORMAL HIGH (ref 0.6–1.3)
GFR est AA: >60
GFR est non-AA: 58
Glucose: 88 mg/dl (ref 74–106)
Potassium: 3.8 mEq/L (ref 3.5–5.1)
Sodium: 140 mEq/L (ref 136–145)

## 2017-08-12 LAB — DRUG SCREEN, URINE
Amphetamine: NEGATIVE
Amphetamines: NEGATIVE
Barbiturates: NEGATIVE
Barbiturates: NEGATIVE
Benzodiazapines: NEGATIVE
Benzodiazepines: NEGATIVE
Cocaine: NEGATIVE
Cocaine: NEGATIVE
Marijuana: POSITIVE — AB
Marijuana: POSITIVE — AB
Methadone: NEGATIVE
Methadone: NEGATIVE
Opiates: NEGATIVE
Opiates: NEGATIVE
Phencyclidine: NEGATIVE
Phencyclidine: NEGATIVE

## 2017-08-12 LAB — ACETAMINOPHEN: Acetaminophen level: <2 ug/mL — ABNORMAL LOW (ref 10.0–30.0)

## 2017-08-12 LAB — SALICYLATE
Salicyclic Acid: <1.7 mg/dl — ABNORMAL LOW (ref 2.8–20.0)
Salicylate: <1.7 mg/dl — ABNORMAL LOW (ref 2.8–20.0)

## 2017-08-12 LAB — ETHYL ALCOHOL
ALCOHOL(ETHYL),SERUM: <3 mg/dl (ref 0.0–9.0)
Ethyl Alcohol: <3 mg/dl (ref 0.0–9.0)

## 2017-08-12 LAB — CBC WITH AUTO DIFFERENTIAL
Basophils %: 0.4 % (ref 0–3)
Eosinophils %: 1.3 % (ref 0–5)
Hematocrit: 47.1 % (ref 37.0–50.0)
Hemoglobin: 15.7 gm/dl (ref 12.4–17.2)
Immature Granulocytes: 0.1 % (ref 0.0–3.0)
Lymphocytes %: 43.8 % (ref 28–48)
MCH: 29.4 pg (ref 23.0–34.6)
MCHC: 33.3 gm/dl (ref 30.0–36.0)
MCV: 88.2 fL (ref 80.0–98.0)
MPV: 9.2 fL (ref 6.0–10.0)
Monocytes %: 5.1 % (ref 1–13)
Neutrophils %: 49.3 % (ref 34–64)
Nucleated RBCs: 0 (ref 0–0)
Platelets: 236 10*3/uL (ref 140–450)
RBC: 5.34 M/uL (ref 3.80–5.70)
RDW-SD: 46.5 — ABNORMAL HIGH (ref 35.1–43.9)
WBC: 9.7 10*3/uL (ref 4.0–11.0)

## 2017-08-12 LAB — BASIC METABOLIC PANEL
Anion Gap: 5 mmol/L (ref 5–15)
BUN: 12 mg/dl (ref 7–25)
CO2: 28 mEq/L (ref 21–32)
Calcium: 9.4 mg/dl (ref 8.5–10.1)
Chloride: 107 mEq/L (ref 98–107)
Creatinine: 1.4 mg/dl — ABNORMAL HIGH (ref 0.6–1.3)
EGFR IF NonAfrican American: 58
GFR African American: >60
Glucose: 88 mg/dl (ref 74–106)
Potassium: 3.8 mEq/L (ref 3.5–5.1)
Sodium: 140 mEq/L (ref 136–145)

## 2017-08-12 LAB — ACETAMINOPHEN LEVEL: Acetaminophen Level: <2 ug/mL — ABNORMAL LOW (ref 10.0–30.0)

## 2017-08-12 MED ORDER — HYDROCODONE-ACETAMINOPHEN 5 MG-325 MG TAB
5-325 mg | ORAL | Status: AC
Start: 2017-08-12 — End: 2017-08-12
  Administered 2017-08-12: 07:00:00 via ORAL

## 2017-08-12 MED ORDER — LORAZEPAM 1 MG TAB
1 mg | ORAL | Status: AC
Start: 2017-08-12 — End: 2017-08-12
  Administered 2017-08-12: 07:00:00 via ORAL

## 2017-08-12 MED ORDER — DIAZEPAM 5 MG TAB
5 mg | ORAL | Status: AC
Start: 2017-08-12 — End: 2017-08-12
  Administered 2017-08-12: 06:00:00 via ORAL

## 2017-08-12 MED ORDER — TRAMADOL 50 MG TAB
50 mg | ORAL | Status: AC
Start: 2017-08-12 — End: 2017-08-11
  Administered 2017-08-12: 01:00:00 via ORAL

## 2017-08-12 MED ORDER — HYDROCODONE-ACETAMINOPHEN 5 MG-325 MG TAB
5-325 mg | ORAL | Status: AC
Start: 2017-08-12 — End: 2017-08-12
  Administered 2017-08-12: 22:00:00 via ORAL

## 2017-08-12 MED FILL — HYDROCODONE-ACETAMINOPHEN 5 MG-325 MG TAB: 5-325 mg | ORAL | Qty: 1

## 2017-08-12 MED FILL — DIAZEPAM 5 MG TAB: 5 mg | ORAL | Qty: 1

## 2017-08-12 MED FILL — TRAMADOL 50 MG TAB: 50 mg | ORAL | Qty: 1

## 2017-08-12 MED FILL — LORAZEPAM 1 MG TAB: 1 mg | ORAL | Qty: 1

## 2017-08-12 NOTE — ED Notes (Signed)
Pt was given a breakfast tray. Pt makes his needs know. Will continue to monitor

## 2017-08-12 NOTE — ED Notes (Signed)
PT SPOKE WITH [facility].

## 2017-08-12 NOTE — ED Notes (Signed)
He remained stable throughout his stay here.    It was felt that he required a TDO    Was obtained    A bed has been [facility] under the care of Dr. Tomasa Randunningham

## 2017-08-12 NOTE — ED Notes (Signed)
Pt refused to change into a gown, states his back hurts to remove his clothes. Pt agreed to inventory and lock his back pack, belt and cell phone. Items inventoried by security and locked in a locker. Pt was checked for sharps, none observed. Pt refuses to give his back brace, states he has a medical conditions and its a violation of his rights to have it removed.

## 2017-08-12 NOTE — Progress Notes (Signed)
CRISIS UPDATE: [facility] assessment supports TDO request. [facility] under care of Dr. Tomasa Randunningham. Attending, Dr. Carmela HurtFickenscher, updated and in agreement with plan. RN, Sasha to provide report to [phone].

## 2017-08-12 NOTE — ED Notes (Signed)
PT SPOKE WITH MD KISA, MD TO ORDER MEDICATIONS AND PT TO BE EVALED BY CRISIS IN AM
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
## 2017-08-12 NOTE — ED Notes (Signed)
 TRANSFER - OUT REPORT:    Verbal report given to Barnie Lacks, RN(name) on Jim Shaw  being transferred to [facility](unit) for routine progression of care       Report consisted of patient's Situation, Background, Assessment and   Recommendations(SBAR).     Information from the following report(s) SBAR, ED Summary and MAR was reviewed with the receiving nurse.    Lines:       Opportunity for questions and clarification was provided.      Patient transported with:   BLS

## 2017-08-12 NOTE — ED Notes (Signed)
1500: I assumed care of this patient from Dr. Carmela HurtFickenscher.  Patient presented with suicidal ideation, was medically cleared and evaluated by psychiatry.    Currently on TDO and pending bed availability, has been [facility]        Disposition:    Transfer to [facility]      Portions of this chart were created with Dragon medical speech to text program.   Unrecognized errors may be present.

## 2017-08-12 NOTE — ED Notes (Signed)
Attempted to call the reports to [facility], the nurse stated she doesnt have any admitting docs on this patient and is not able to take the report at this time, asked to call back in 30 minutes.

## 2017-08-12 NOTE — Progress Notes (Signed)
PSYCH UPDATE NOTE: The [facility] psych staff were notified and a psych pre-screening for a possible TDO was requested. The [facility] psych pre-screener, West Pugh, came to this ER and is in the process of assessing this patient for the possible TDO.

## 2017-08-12 NOTE — Progress Notes (Signed)
PSYCH UPDATE NOTE: Writer call the [place] police prescient non-emergency number and ask for the police to check at the address of [address] [place], [place] to enquire if the person, Jim Shaw, lived at that residence. Writer told the responding police at the non-emergency number at [phone] that the [place] Jim Shaw need to be informed that this patient had stated that he would kill [place] Jim Shaw if he ever saw her again; secondary to patient's belief that  Jim Shaw infected him with the HIV virus. Writer also called the Child Protective Services to inquire if they would handle the case involving a 2 yo child that might be infected and the mother infected with the HIV virus without their knowledge. Latoya from the Child Protective Services stated that someone should call the [place] health department at [phone] on Monday and make a report.

## 2017-08-12 NOTE — ED Notes (Signed)
 [facility] CALLED BACK, STATES PT WOULD NOT BE VOLUNTARY, DUE TO PT STATING TO [facility] INTAKE RN, NO ONE IS GONNA HELP ME, SO ILL JUST KILL MYSELF AND HUNG UP PHONE.   [facility] STATES THEY WILL TAKE PT IF HE IS A TDO. PROVIDERS AWARE.

## 2017-08-12 NOTE — ED Notes (Signed)
PT TO REMAIN INVOLUNTARY FOR NOW, IF PT DECIDES TO LEAVE, INSTRUCTED BY MD TO CALL POLICE.
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
## 2017-08-12 NOTE — ED Notes (Signed)
Called back [facility] to give the [facility]. The nurse states she is not able to take the report at this time.

## 2017-08-12 NOTE — ED Notes (Signed)
Pt was given lunch box and juice. Call bell is within reach. Pt makes his needs known

## 2017-08-12 NOTE — Progress Notes (Signed)
LATE PSYCH UPDATE NOTE: Writer called a nurse on the pending [facility]. Writer asks the nurse to make note that [facility] needed to be called on Monday regarding F/U on the possibility of the reportedly named [place] Mooreameka Jackson and her 46 yo child might unknowingly be positive for HIV virus. Writer provided the [facility] the phone number for [facility]. The [facility] said that she would pass the information to the facility's nurse supervisor for F/U.

## 2017-08-12 NOTE — Progress Notes (Signed)
CRISIS UPDATE: [facility] assessment supports TDO request. Bed placement at [facility] on R[facility] under care of Dr. Cunningham. Attending, Dr. Fickenscher, updated and in agreement with plan. RN, Sasha to provide report to [phone].
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
## 2017-08-12 NOTE — ED Notes (Signed)
[facility] CALLED BACK, STATES PT WOULD NOT BE VOLUNTARY, DUE TO PT STATING TO [facility] INTAKE RN, "NO ONE IS GONNA HELP ME, SO ILL JUST KILL MYSELF" AND HUNG UP PHONE.   [facility] STATES THEY WILL TAKE PT IF HE IS A TDO. PROVIDERS AWARE.

## 2017-08-12 NOTE — ED Notes (Signed)
PT TO REMAIN INVOLUNTARY FOR NOW, IF PT DECIDES TO LEAVE, INSTRUCTED BY MD TO CALL POLICE.

## 2017-08-12 NOTE — ED Notes (Signed)
Pt refused to change into a gown, states his back hurts to remove his clothes. Pt agreed to inventory and lock his back pack, belt and cell phone. Items inventoried by security and locked in a locker. Pt was checked for sharps, none observed. Pt refuses to give his back brace, states he has a medical conditions and its a violation of his rights to have it removed.
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
## 2017-08-12 NOTE — ED Notes (Signed)
Pt was given lunch box and juice. Call bell is within reach. Pt makes his needs known

## 2017-08-12 NOTE — ED Notes (Signed)
1500: I assumed care of this patient from Dr. Fickenscher.  Patient presented with suicidal ideation, was medically cleared and evaluated by psychiatry.    Currently on TDO and pending bed availability, has been accepted to [place] [facility]        Disposition:    Transfer to [place] [facility]      Portions of this chart were created with Dragon medical speech to text program.   Unrecognized errors may be present.

## 2017-08-12 NOTE — ED Notes (Signed)
PT SPOKE WITH MD KISA, MD TO ORDER MEDICATIONS AND PT TO BE EVALED BY CRISIS IN AM

## 2017-08-12 NOTE — Progress Notes (Signed)
PSYCH UPDATE NOTE: Writer call the [place] police prescient non-emergency number and ask for the police to check at the address of [address], [place], [place] to enquire if the person, Jim Shaw, lived at that residence. Writer told the responding police at the non-emergency number at [phone] that the Jim Shaw need to be informed that this patient had stated that he would kill Jim Shaw if he ever saw her again; secondary to patient's belief that Jim Shaw infected him with the HIV virus. Writer also called the Child Protective Services to inquire if they would handle the case involving a 2 yo child that might be infected and the mother infected with the HIV virus without their knowledge. Latoya from the Child Protective Services stated that someone should call the [place] health department at [phone] on Monday and make a report.

## 2017-08-12 NOTE — ED Notes (Signed)
Called back [facility] to give the report on Ramonte Kenna. The nurse states she is not able to take the report at this time.

## 2017-08-12 NOTE — ED Notes (Signed)
TRANSFER - OUT REPORT:    Verbal report given to Deborah Mueller, RN(name) on Jim Shaw  being transferred to [facility](unit) for routine progression of care       Report consisted of patient???s Situation, Background, Assessment and   Recommendations(SBAR).     Information from the following report(s) SBAR, ED Summary and MAR was reviewed with the receiving nurse.    Lines:       Opportunity for questions and clarification was provided.      Patient transported with:   BLS

## 2017-08-12 NOTE — ED Notes (Signed)
He remained stable throughout his stay here.    It was felt that he required a TDO    Was obtained    A bed has been obtained at [place] [facility] under the care of Dr. Cunningham

## 2017-08-12 NOTE — Progress Notes (Signed)
LATE PSYCH UPDATE NOTE: Writer called a nurse on the pending inpatient psych placement unit at [facility]. Writer asks the nurse to make note that the [place] [facility] needed to be called on Monday regarding F/U on the possibility of the reportedly named Tameka Jackson and her 46 yo child might unknowingly be positive for HIV virus. Writer provided the [facility] the phone number for the [place] [facility]. The [facility] said that she would pass the information to the facility's nurse supervisor for F/U.

## 2017-08-12 NOTE — ED Notes (Signed)
Pt was given a breakfast tray. Pt makes his needs know. Will continue to monitor

## 2017-08-12 NOTE — ED Notes (Signed)
PT SPOKE WITH [facility].

## 2017-08-12 NOTE — ED Notes (Signed)
Attempted to call the reports to [facility], the nurse stated she doesnt have any admitting docs on this patient and is not able to take the report at this time, asked to call back in 30 minutes.

## 2017-08-12 NOTE — Progress Notes (Signed)
PSYCH UPDATE NOTE: The [facility] psych staff were notified and a psych pre-screening for a possible TDO was requested. The [facility] psych pre-screener, Teisha, came to this ER and is in the process of assessing this patient for the possible TDO.

## 2017-10-12 ENCOUNTER — Ambulatory Visit
Admit: 2017-10-12 | Discharge: 2017-10-12 | Payer: PRIVATE HEALTH INSURANCE | Attending: Physical Medicine & Rehabilitation | Primary: Family Medicine

## 2017-10-12 ENCOUNTER — Ambulatory Visit: Attending: Physical Medicine & Rehabilitation | Primary: Family Medicine

## 2017-10-12 DIAGNOSIS — M545 Low back pain, unspecified: Secondary | ICD-10-CM

## 2017-10-12 MED ORDER — GABAPENTIN 600 MG TAB
600 mg | ORAL_TABLET | Freq: Three times a day (TID) | ORAL | 1 refills | Status: DC
Start: 2017-10-12 — End: 2017-11-09

## 2017-10-12 NOTE — Progress Notes (Signed)
Progress Notes by Andria Meuse, MD at 10/12/17 [phone]                Author: Andria Meuse, MD  Service: --  Author Type: Physician       Filed: 10/12/17 1036  Encounter Date: 10/12/2017  Status: Signed          Editor: Andria Meuse, MD (Physician)                     [facility]  [address]  [place], [place] [address]  Phone: [phone]  Fax: [phone]            INITIAL CONSULTATION         HISTORY OF PRESENT ILLNESS:   Jim Shaw is a 47 y.o.  male whom is referred from Dr. Cyndie Chime secondary to progressive low back pain extending into the RLE in an L4 distribution to the knee and paraesthesias  in the foot involving all the toes x 2017 following work injury. He rates his pain 8/10. Paint exacerbated with prolonged positions. Pt denies  hx of spinal surgery. Pt is currently enrolled PT. Pt recalls lumbar injection with Dr. Paulina Fusi with temporary relief. Pt is currently on Neurontin 300 mg TID with no benefit. Previously treated with Tramadol. Following with Dr. [place] Riding for chronic urinary  urgency, Dx still pending. Pt denies fever, weight loss, or skin changes. PmHx Psychotic disorder. ER note from Dr. Arvella Merles dated 08/11/17 indicating  patient presented for chronic low back pain and chronic urinary urgency since 2017 following work injury. Not able to work since. States pt has been in ER dep b/c he felt overwhelmed and attempted to kill himself with heroine and cocaine. [facility].  Seeing a urologist for urinary urgency. Indicated pt has chronic right leg weakness. UDS positive for marijuana. Note from [name], MD  dated 09/04/17 indicating patient was seen with c/o chronic back pain and neuropathic foot pain. Inidcated low back pain with sciatica on right side. Started him on Cymbalta.      The patient is ambidextrous. PMP reviewed. Body mass index is 40.2 kg/m??.      PCP: [name], MD      Past Medical History:       Diagnosis  Date      ?  Costochondritis        ?  Psychiatric disorder                  Past Surgical History:      Procedure  Laterality  Date      ?  HX ORTHOPAEDIC            Surgery on knee (right knee)                Social History          Tobacco Use         ?  Smoking status:  Former Smoker     ?  Smokeless tobacco:  Never Used       Substance Use Topics         ?  Alcohol use:  Yes             Comment: rearly       Work status: The patient is unknown.  Marital status: The patient is married.               Allergies  Allergen  Reactions         ?  Aspirin  Other (comments)         ?  Ibuprofen  Rash                  Family History       Family history unknown: Yes              REVIEW OF SYSTEMS  Constitutional symptoms: Negative  Eyes: Negative  Ears, Nose, Throat, and Mouth: Negative  Cardiovascular:  Negative  Respiratory: Negative  Genitourinary: Negative  Integumentary (Skin and/or breast): Negative  Musculoskeletal: Positive for low back pain, RLE pain    Extremities: Negative for edema.  Endocrine/Rheumatologic: Negative  Hematologic/Lymphatic: Negative  Allergic/Immunologic: Negative  Psychiatric: Negative          PHYSICAL EXAMINATION  Visit Vitals      BP  127/84 (BP 1 Location: Left arm, BP Patient Position: Sitting)     Pulse  68     Temp  98.6 ??F (37 ??C) (Oral)     Resp  18     Ht  6' (1.829 m)     Wt  296 lb 6.4 oz (134.4 kg)     SpO2  97%        BMI  40.20 kg/m??          CONSTITUTIONAL: NAD, A&O x 3  HEART: Regular rate and rhythm   ABDOMEN: Positive bowel sounds, soft, nontender, and nondistended  LUNGS: Clear to  auscultation bilaterally.   SKIN: Negative for rash. Acne on chest   RANGE OF MOTION: The patient has full passive range of motion in all four extremities.   SENSATION: Decreased sensation to light touch digits 1,4,5 RUE and L4 and S1 RLE . Sensation to light touch otherwise intact.   MOTOR:    Straight Leg Raise: Negative, bilateral, required coaxing    Hoffman: Negative,  bilateral   Deep tendon reflexes are 0 at the brachioradialis, biceps, and triceps.   Deep tendon reflexes are 1+ at the knees and ankles bilaterally.                 Shoulder AB/Flex  Elbow Flex  Wrist Ext  Elbow Ext  Wrist Flex  Hand Intrin  Tone              Right  +4/5  +4/5  +4/5  +4/5  +4/5  +4/5  +4/5              Left  +4/5  +4/5  +4/5  +4/5  +4/5  +4/5  +4/5                                            Hip Flex  Knee Ext  Knee Flex  Ankle DF  GTE  Ankle PF  Tone              Right  +4/5  +4/5  +4/5  +4/5  +4/5  +4/5  +4/5              Left  +4/5  +4/5  +4/5  +4/5  +4/5  +4/5  +4/5        Ambulates with a single point cane.     ASSESSMENT   Diagnoses and all orders  for this visit:  1. Lumbar pain      2. Obesity, morbid (HCC)      3. Lumbar neuritis                IMPRESSIONS/RECOMMENDATIONS:   Patient presents today with c/o  progressive low back pain extending into the RLE in an L4 distribution to the knee and paraesthesias in the foot involving all the toes x 2017 following work injury.  I will increase his dose of Neurontin to 600 mg TID. Patient advised to call the office if intolerant to higher dose. Patient is scheduled for an MRI for tomorrow. I advised patient to bring copies of films to next visit. I will have the patient sign  a release of medical information to obtain block notes from Dr. Paulina Fusi. Multiple treatment options were discussed. Patient is neurologically intact. I will see the patient back following MRI.         Written by Jacklynn Bue, ScribeKick, as dictated by Laney Pastor, MD   I examined the patient, reviewed and agree with the note.

## 2017-10-12 NOTE — Progress Notes (Signed)
[facility]  [address]  [place], [place] [id number]  Phone: [phone]  Fax: [phone]        INITIAL CONSULTATION      HISTORY OF PRESENT ILLNESS:  Jim ContesChristopher Shaw is a 47 y.o. male whom is referred from Dr. Cyndie ChimeNguyen secondary to progressive low back pain extending into the RLE in an L4 distribution to the knee and paraesthesias in the foot involving all the toes x 2017 following work injury. He rates his pain 8/10. Paint exacerbated with prolonged positions. Pt denies hx of spinal surgery. Pt is currently enrolled PT. Pt recalls lumbar injection with Dr. Paulina FusiWardell with temporary relief. Pt is currently on Neurontin 300 mg TID with no benefit. Previously treated with Tramadol. Following with Dr. [place] RidingBurgess for chronic urinary urgency, Dx still pending. Pt denies fever, weight loss, or skin changes. PmHx Psychotic disorder. ER note from Dr. Arvella MerlesKisa dated 08/11/17 indicating patient presented for chronic low back pain and chronic urinary urgency since 2017 following work injury. Not able to work since. States pt has been in ER dep b/c he felt overwhelmed and attempted to kill himself with heroine and cocaine. [facility]. Seeing a urologist for urinary urgency. Indicated pt has chronic right leg weakness. UDS positive for marijuana. Note from Jim DecreeZamani, Jim R, MD dated 09/04/17 indicating patient was seen with c/o chronic back pain and neuropathic foot pain. Inidcated low back pain with sciatica on right side. Started him on Cymbalta.    The patient is ambidextrous. PMP reviewed. Body mass index is 40.2 kg/m??.    PCP: Jim DecreeZamani, Jim R, MD    Past Medical History:   Diagnosis Date   ??? Costochondritis    ??? Psychiatric disorder           Past Surgical History:   Procedure Laterality Date   ??? HX ORTHOPAEDIC      Surgery on knee (right knee)          Social History     Tobacco Use   ??? Smoking status: Former Smoker   ??? Smokeless tobacco: Never Used   Substance Use Topics    ??? Alcohol use: Yes     Comment: rearly     Work status: The patient is unknown.  Marital status: The patient is married.         Allergies   Allergen Reactions   ??? Aspirin Other (comments)   ??? Ibuprofen Rash            Family History   Family history unknown: Yes         REVIEW OF SYSTEMS  Constitutional symptoms: Negative  Eyes: Negative  Ears, Nose, Throat, and Mouth: Negative  Cardiovascular: Negative  Respiratory: Negative  Genitourinary: Negative  Integumentary (Skin and/or breast): Negative  Musculoskeletal: Positive for low back pain, RLE pain   Extremities: Negative for edema.  Endocrine/Rheumatologic: Negative  Hematologic/Lymphatic: Negative  Allergic/Immunologic: Negative  Psychiatric: Negative       PHYSICAL EXAMINATION  Visit Vitals  BP 127/84 (BP 1 Location: Left arm, BP Patient Position: Sitting)   Pulse 68   Temp 98.6 ??F (37 ??C) (Oral)   Resp 18   Ht 6' (1.829 m)   Wt 296 lb 6.4 oz (134.4 kg)   SpO2 97%   BMI 40.20 kg/m??       CONSTITUTIONAL: NAD, A&O x 3  HEART: Regular rate and rhythm  ABDOMEN: Positive bowel sounds, soft, nontender, and nondistended  LUNGS: Clear to auscultation  bilaterally.  SKIN: Negative for rash. Acne on chest  RANGE OF MOTION: The patient has full passive range of motion in all four extremities.  SENSATION: Decreased sensation to light touch digits 1,4,5 RUE and L4 and S1 RLE. Sensation to light touch otherwise intact.   MOTOR:   Straight Leg Raise: Negative, bilateral, required coaxing   Hoffman: Negative, bilateral  Deep tendon reflexes are 0 at the brachioradialis, biceps, and triceps.  Deep tendon reflexes are 1+ at the knees and ankles bilaterally.     Shoulder AB/Flex Elbow Flex Wrist Ext Elbow Ext Wrist Flex Hand Intrin Tone   Right +4/5 +4/5 +4/5 +4/5 +4/5 +4/5 +4/5   Left +4/5 +4/5 +4/5 +4/5 +4/5 +4/5 +4/5              Hip Flex Knee Ext Knee Flex Ankle DF GTE Ankle PF Tone   Right +4/5 +4/5 +4/5 +4/5 +4/5 +4/5 +4/5   Left +4/5 +4/5 +4/5 +4/5 +4/5 +4/5 +4/5      Ambulates with a single point cane.     ASSESSMENT   Diagnoses and all orders for this visit:    1. Lumbar pain    2. Obesity, morbid (HCC)    3. Lumbar neuritis           IMPRESSIONS/RECOMMENDATIONS:  Patient presents today with c/o  progressive low back pain extending into the RLE in an L4 distribution to the knee and paraesthesias in the foot involving all the toes x 2017 following work injury. I will increase his dose of Neurontin to 600 mg TID. Patient advised to call the office if intolerant to higher dose. Patient is scheduled for an MRI for tomorrow. I advised patient to bring copies of films to next visit. I will have the patient sign a release of medical information to obtain block notes from Dr. Paulina Fusi. Multiple treatment options were discussed. Patient is neurologically intact. I will see the patient back following MRI.      Written by Jim Shaw, ScribeKick, as dictated by Laney Pastor, MD  I examined the patient, reviewed and agree with the note.

## 2017-10-23 ENCOUNTER — Encounter: Attending: Physical Medicine & Rehabilitation | Primary: Family Medicine

## 2017-11-06 ENCOUNTER — Encounter: Attending: Physical Medicine & Rehabilitation | Primary: Family Medicine
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
## 2017-11-08 ENCOUNTER — Inpatient Hospital Stay
Admit: 2017-11-08 | Discharge: 2017-11-09 | Disposition: A | Discharge status: Home / Self Care | Payer: MEDICAID | Attending: Psychiatry | Admitting: Psychiatry

## 2017-11-08 DIAGNOSIS — F331 Major depressive disorder, recurrent, moderate: Principal | ICD-10-CM

## 2017-11-08 LAB — ETHYL ALCOHOL
ALCOHOL(ETHYL),SERUM: 5 MG/DL — ABNORMAL HIGH (ref 0–3)
Ethyl Alcohol: 5 MG/DL — ABNORMAL HIGH (ref 0–3)

## 2017-11-08 LAB — CBC WITH AUTOMATED DIFF
ABS. BASOPHILS: 0 10*3/uL (ref 0.0–0.1)
ABS. EOSINOPHILS: 0.1 10*3/uL (ref 0.0–0.4)
ABS. LYMPHOCYTES: 3 10*3/uL (ref 0.9–3.6)
ABS. MONOCYTES: 0.4 10*3/uL (ref 0.05–1.2)
ABS. NEUTROPHILS: 3.3 10*3/uL (ref 1.8–8.0)
BASOPHILS: 1 % (ref 0–2)
EOSINOPHILS: 2 % (ref 0–5)
HCT: 43.1 % (ref 36.0–48.0)
HGB: 14.5 g/dL (ref 13.0–16.0)
LYMPHOCYTES: 44 % (ref 21–52)
MCH: 29.5 PG (ref 24.0–34.0)
MCHC: 33.6 g/dL (ref 31.0–37.0)
MCV: 87.8 FL (ref 74.0–97.0)
MONOCYTES: 6 % (ref 3–10)
MPV: 9.3 FL (ref 9.2–11.8)
NEUTROPHILS: 47 % (ref 40–73)
PLATELET: 231 10*3/uL (ref 135–420)
RBC: 4.91 M/uL (ref 4.70–5.50)
RDW: 15.6 % — ABNORMAL HIGH (ref 11.6–14.5)
WBC: 6.8 10*3/uL (ref 4.6–13.2)

## 2017-11-08 LAB — METABOLIC PANEL, COMPREHENSIVE
A-G Ratio: 0.9 (ref 0.8–1.7)
ALT (SGPT): 45 U/L (ref 16–61)
AST (SGOT): 29 U/L (ref 10–38)
Albumin: 3.4 g/dL (ref 3.4–5.0)
Alk. phosphatase: 61 U/L (ref 45–117)
Anion gap: 2 mmol/L — ABNORMAL LOW (ref 3.0–18)
BUN/Creatinine ratio: 7 — ABNORMAL LOW (ref 12–20)
BUN: 8 MG/DL (ref 7.0–18)
Bilirubin, total: 0.3 MG/DL (ref 0.2–1.0)
CO2: 31 mmol/L (ref 21–32)
Calcium: 8 MG/DL — ABNORMAL LOW (ref 8.5–10.1)
Chloride: 106 mmol/L (ref 100–111)
Creatinine: 1.2 MG/DL (ref 0.6–1.3)
GFR est AA: >60 mL/min/{1.73_m2} (ref >60)
GFR est non-AA: >60 mL/min/{1.73_m2} (ref >60)
Globulin: 3.7 g/dL (ref 2.0–4.0)
Glucose: 91 mg/dL (ref 74–99)
Potassium: 3.9 mmol/L (ref 3.5–5.5)
Protein, total: 7.1 g/dL (ref 6.4–8.2)
Sodium: 139 mmol/L (ref 136–145)

## 2017-11-08 LAB — DRUG SCREEN, URINE
AMPHETAMINES: NEGATIVE
Amphetamine Screen, Urine: NEGATIVE
BARBITURATES: NEGATIVE
BENZODIAZEPINES: NEGATIVE
Barbiturate Screen, Urine: NEGATIVE
Benzodiazepine Screen, Urine: NEGATIVE
COCAINE: NEGATIVE
Cocaine Screen Urine: NEGATIVE
METHADONE: NEGATIVE
Methadone Screen, Urine: NEGATIVE
OPIATES: NEGATIVE
Opiate Screen, Urine: NEGATIVE
PCP Screen, Urine: NEGATIVE
PCP(PHENCYCLIDINE): NEGATIVE
THC (TH-CANNABINOL): POSITIVE — AB
THC Screen, Urine: POSITIVE — AB

## 2017-11-08 LAB — CBC WITH AUTO DIFFERENTIAL
Basophils %: 1 % (ref 0–2)
Basophils Absolute: 0 10*3/uL (ref 0.0–0.1)
Eosinophils %: 2 % (ref 0–5)
Eosinophils Absolute: 0.1 10*3/uL (ref 0.0–0.4)
Hematocrit: 43.1 % (ref 36.0–48.0)
Hemoglobin: 14.5 g/dL (ref 13.0–16.0)
Lymphocytes %: 44 % (ref 21–52)
Lymphocytes Absolute: 3 10*3/uL (ref 0.9–3.6)
MCH: 29.5 PG (ref 24.0–34.0)
MCHC: 33.6 g/dL (ref 31.0–37.0)
MCV: 87.8 FL (ref 74.0–97.0)
MPV: 9.3 FL (ref 9.2–11.8)
Monocytes %: 6 % (ref 3–10)
Monocytes Absolute: 0.4 10*3/uL (ref 0.05–1.2)
Neutrophils %: 47 % (ref 40–73)
Neutrophils Absolute: 3.3 10*3/uL (ref 1.8–8.0)
Platelets: 231 10*3/uL (ref 135–420)
RBC: 4.91 M/uL (ref 4.70–5.50)
RDW: 15.6 % — ABNORMAL HIGH (ref 11.6–14.5)
WBC: 6.8 10*3/uL (ref 4.6–13.2)

## 2017-11-08 LAB — COMPREHENSIVE METABOLIC PANEL
ALT: 45 U/L (ref 16–61)
AST: 29 U/L (ref 10–38)
Albumin/Globulin Ratio: 0.9 (ref 0.8–1.7)
Albumin: 3.4 g/dL (ref 3.4–5.0)
Alkaline Phosphatase: 61 U/L (ref 45–117)
Anion Gap: 2 mmol/L — ABNORMAL LOW (ref 3.0–18)
BUN: 8 MG/DL (ref 7.0–18)
Bun/Cre Ratio: 7 — ABNORMAL LOW (ref 12–20)
CO2: 31 mmol/L (ref 21–32)
Calcium: 8 MG/DL — ABNORMAL LOW (ref 8.5–10.1)
Chloride: 106 mmol/L (ref 100–111)
Creatinine: 1.2 MG/DL (ref 0.6–1.3)
EGFR IF NonAfrican American: >60 mL/min/{1.73_m2} (ref >60)
GFR African American: >60 mL/min/{1.73_m2} (ref >60)
Globulin: 3.7 g/dL (ref 2.0–4.0)
Glucose: 91 mg/dL (ref 74–99)
Potassium: 3.9 mmol/L (ref 3.5–5.5)
Sodium: 139 mmol/L (ref 136–145)
Total Bilirubin: 0.3 MG/DL (ref 0.2–1.0)
Total Protein: 7.1 g/dL (ref 6.4–8.2)

## 2017-11-08 MED ORDER — VENLAFAXINE SR 150 MG 24 HR CAP
150 mg | Freq: Every day | ORAL | Status: DC
Start: 2017-11-08 — End: 2017-11-09
  Administered 2017-11-09: 13:00:00 via ORAL

## 2017-11-08 MED ORDER — PRAZOSIN 5 MG CAP
5 mg | Freq: Every evening | ORAL | Status: DC
Start: 2017-11-08 — End: 2017-11-09
  Administered 2017-11-09: via ORAL

## 2017-11-08 MED ORDER — QUETIAPINE 25 MG TAB
25 mg | Freq: Every evening | ORAL | Status: DC
Start: 2017-11-08 — End: 2017-11-09
  Administered 2017-11-09: via ORAL

## 2017-11-08 MED ORDER — TRAZODONE 50 MG TAB
50 mg | Freq: Every evening | ORAL | Status: DC | PRN
Start: 2017-11-08 — End: 2017-11-09
  Administered 2017-11-09: via ORAL

## 2017-11-08 MED ORDER — HYDROXYZINE PAMOATE 50 MG CAP
50 mg | ORAL | Status: DC | PRN
Start: 2017-11-08 — End: 2017-11-09
  Administered 2017-11-09: via ORAL
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
## 2017-11-08 NOTE — ED Notes (Signed)
 Pt arrived through triage with c/o mental health issues. Pt not answering RN questions in triage stating, this is just uncomfortable. I'm sorry I keep repeating myself.

## 2017-11-08 NOTE — Behavioral Health Treatment Team (Signed)
 Patient approached the nursing station and asked this Clinical research associate for a pencil. He was given a standard pencil that is acceptable for psych clients. He walked away and came back in approx five minutes and stated angrily, I am voluntary and I want to get out of here tomorrow.  I am NOT used to being couped up in a place like this!!! I like my freedom and I like my air!!! I want to file a grievance because a difference is being made here. Look there they are using those gel pens, they are worse than these little pencils. This writer attempted to calm patient and offer him a longer pencil but he stormed away.  Next, he slammed the door to his room and started asking for the supervisor. Security was called for backup and supervisor was notified of patients behavior and here to speak to patient.

## 2017-11-08 NOTE — ED Notes (Signed)
Crisis at bedside.

## 2017-11-08 NOTE — ED Notes (Signed)
 Pt continues, I got PTSD and anger issue.

## 2017-11-08 NOTE — Behavioral Health Treatment Team (Signed)
 Patient visible on unit. Isolative and guarded with poor eye contact. Ate dinner meal and watched tv in leisure time with no interacting with peers.  Patient asked questions asked regarding his medication doses. States the medications that he  on does not help him.  Reports fleeting thoughts of+SI.  States he is feeling at the end of his road. Denies SI/HI/AH. Contracts for safety on unit. Will continue to monitor and provide a safe environment.
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
## 2017-11-08 NOTE — ED Notes (Signed)
TRANSFER - OUT REPORT:    Verbal report given to Rexi,RN(name) on Jim Shaw  being transferred to Adult behavioral unit(unit) for routine progression of care       Report consisted of patient's Situation, Background, Assessment and   Recommendations(SBAR).     Information from the following report(s) ED Summary was reviewed with the receiving nurse.    Lines:       Opportunity for questions and clarification was provided.      Patient transported with:   Tech           t

## 2017-11-08 NOTE — ED Provider Notes (Signed)
EMERGENCY DEPARTMENT HISTORY AND PHYSICAL EXAM    11:00 AM  Date: 11/08/2017  Patient Name: Jim Shaw    History of Presenting Illness     Chief Complaint   Patient presents with   ??? Mental Health Problem        History Provided By: Patient    HPI: Jim Shaw is a 47 y.o. male with PTSD and depression.  Patient was sent by his psychiatrist for evaluation.  She reports symptoms of depression and insomnia.  Denies SI but have thoughts about hurting other people.  Denies alcohol or drug use.    Location:  Severity:  Timing/course:   Onset/Duration:     PCP: [name], MD    Past History     Past Medical History:  Past Medical History:   Diagnosis Date   ??? Costochondritis    ??? Psychiatric disorder        Past Surgical History:  Past Surgical History:   Procedure Laterality Date   ??? HX ORTHOPAEDIC      Surgery on knee (right knee)        Family History:  Family History   Family history unknown: Yes       Social History:  Social History     Tobacco Use   ??? Smoking status: Former Smoker   ??? Smokeless tobacco: Never Used   Substance Use Topics   ??? Alcohol use: Yes     Comment: rearly   ??? Drug use: Yes     Types: Marijuana       Allergies:  Allergies   Allergen Reactions   ??? Aspirin Other (comments)   ??? Ibuprofen Rash       Review of Systems   Review of Systems   Psychiatric/Behavioral: Positive for behavioral problems, dysphoric mood and sleep disturbance.   All other systems reviewed and are negative.       Physical Exam     Patient Vitals for the past 12 hrs:   Temp Pulse Resp BP SpO2   11/08/17 1048 98.9 ??F (37.2 ??C) 74 18 (!) 135/93 100 %       Physical Exam   Constitutional: He is oriented to person, place, and time. He appears well-developed and well-nourished.   HENT:   Head: Normocephalic and atraumatic.   Eyes: Conjunctivae and EOM are normal.   Neck: Normal range of motion. Neck supple.   Cardiovascular: Normal rate.   Pulmonary/Chest: Effort normal. No respiratory  distress.   Musculoskeletal: Normal range of motion.   Neurological: He is alert and oriented to person, place, and time.   Skin: Skin is warm and dry.   Psychiatric: His speech is delayed. He is slowed and withdrawn. He exhibits a depressed mood. He expresses homicidal ideation.       Diagnostic Study Results     Labs -  No results found for this or any previous visit (from the past 12 hour(s)).    Radiologic Studies -   No results found.      Medical Decision Making     ED Course: Progress Notes, Reevaluation, and Consults:    11:00 AM Initial assessment performed. The patients presenting problems have been discussed, and they/their family are in agreement with the care plan formulated and outlined with them.  I have encouraged them to ask questions as they arise throughout their visit.    Team evaluated the patient and they had a phone call from his psychiatrist and they  recommended admission after he has been medically cleared.    Provider Notes (Medical Decision Making): 47 year old male history of PTSD and depression presenting with symptoms of depression and HI.  Patient is well-appearing on exam and has no physical complaints but appears very depressed.  Will get screening labs and consult crisis team    Procedures:     Critical Care Time:     Vital Signs-Reviewed the patient's vital signs. Reviewed pt's pulse ox reading.     EKG:  Interpreted by the EP.   Time Interpreted:    Rate:    Rhythm:    Interpretation:   Comparison:     Records Reviewed: Nursing Notes (Time of Review: 11:00 AM)  -I am the first provider for this patient.  -I reviewed the vital signs, available nursing notes, past medical history, past surgical history, family history and social history.    Current Outpatient Medications   Medication Sig Dispense Refill   ??? gabapentin (NEURONTIN) 300 mg capsule Take 300 mg by mouth.     ??? hydrOXYzine pamoate (VISTARIL) 50 mg capsule TK 1 C PO Q 12 H PRN     ??? fluticasone propionate (FLONASE) 50  mcg/actuation nasal spray 2 Sprays by Nasal route.     ??? prazosin (MINIPRESS) 1 mg capsule TK 1 C PO QHS     ??? venlafaxine (EFFEXOR) 37.5 mg tablet Take 37.5 mg by mouth three (3) times daily.     ??? gabapentin (NEURONTIN) 600 mg tablet Take 1 Tab by mouth three (3) times daily. Max Daily Amount: 1,800 mg. 90 Tab 1        Clinical Impression     Clinical Impression: No diagnosis found.    Disposition: admit    This note was dictated utilizing voice recognition software which may lead to typographical errors.  I apologize in advance if the situation occurs.  If questions arise please do not hesitate to contact me or call our department.    Quantavia Frith A Kannen Moxey, MD  11:00 AM

## 2017-11-08 NOTE — ED Notes (Signed)
Pt refusing to change into paper scrubs.

## 2017-11-08 NOTE — ED Notes (Signed)
Pt provided with lunch tray

## 2017-11-08 NOTE — Group Note (Signed)
[facility] GROUP DOCUMENTATION INDIVIDUAL                                                                          Group Therapy Note    Date: October 16    Group Start Time: 1915  Group End Time: 1945  Group Topic: Nursing    [facility] ADULT CHEM DEP    Winn, Thressa M., RN    [facility] GROUP DOCUMENTATION GROUP    Group Therapy Note    Attendees: 3       Attendance: Did not attend                              Thressa M. Winn, RN
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
## 2017-11-08 NOTE — ED Triage Notes (Signed)
Pt continues, "I got PTSD and anger issue."

## 2017-11-08 NOTE — Behavioral Health Treatment Team (Signed)
Patient visible on unit. Isolative and guarded with poor eye contact. Ate dinner meal and watched tv in leisure time with no interacting with peers.  Patient asked questions asked regarding his medication doses. States the medications that he  on does not help him.  Reports fleeting thoughts of+SI.  States he is feeling at the "end of his road." Denies SI/HI/AH. Contracts for safety on unit. Will continue to monitor and provide a safe environment.

## 2017-11-08 NOTE — ED Notes (Signed)
TRANSFER - OUT REPORT:    Verbal report given to Rexi,RN(name) on Jim Shaw  being transferred to Adult behavioral unit(unit) for routine progression of care       Report consisted of patient???s Situation, Background, Assessment and   Recommendations(SBAR).     Information from the following report(s) ED Summary was reviewed with the receiving nurse.    Lines:       Opportunity for questions and clarification was provided.      Patient transported with:   Tech           t

## 2017-11-08 NOTE — Behavioral Health Treatment Team (Signed)
Patient approached the nursing station and asked this writer for a pencil. He was given a standard pencil that is acceptable for psych clients. He walked away and came back in approx five minutes and stated angrily, "I am voluntary and I want to get out of here tomorrow.  I am NOT used to being couped up in a place like this!!! I like my freedom and I like my air!!! I want to file a grievance because a difference is being made here. Look there they are using those gel pens, they are worse than these little pencils. This writer attempted to calm patient and offer him a longer pencil but he stormed away.  Next, he slammed the door to his room and started asking for the supervisor. Security was called for backup and supervisor was notified of patients behavior and here to speak to patient.

## 2017-11-08 NOTE — Other (Signed)
11/07/17 1530   Group Therapy   Group Social work  (Positive Changes ??? Fall Forward versus Falling Back???    Attendance Did not Attend- had just arrived on the unit.    Number of participants 4   Time in 1430   Time out 1515   Total Time 45   MTP problem Depression   Interventions/techniques Informed; Provided feedback; Supported   Theatre stage managerollows directions NA   Interactions NA   Mental Status NA   Behavior/appearance NA   Long Term Risk of Suicide NA   Immediate Risk for Suicide NA   Suicide Protective Factors NA   Risk of Violence NA   Risk of Trauma NA   Goals Achieved NA

## 2017-11-08 NOTE — ED Triage Notes (Signed)
Pt arrived through triage with c/o "mental health issues." Pt not answering RN questions in triage stating, "this is just uncomfortable. I'm sorry I keep repeating myself."

## 2017-11-08 NOTE — ED Provider Notes (Signed)
EMERGENCY DEPARTMENT HISTORY AND PHYSICAL EXAM    11:00 AM  Date: 11/08/2017  Patient Name: Jim Shaw    History of Presenting Illness     Chief Complaint   Patient presents with   ??? Mental Health Problem        History Provided By: Patient    HPI: Jim Shaw is a 47 y.o. male with PTSD and depression.  Patient was sent by his psychiatrist for evaluation.  She reports symptoms of depression and insomnia.  Denies SI but have thoughts about hurting other people.  Denies alcohol or drug use.    Location:  Severity:  Timing/course:   Onset/Duration:     PCP: [name], MD    Past History     Past Medical History:  Past Medical History:   Diagnosis Date   ??? Costochondritis    ??? Psychiatric disorder        Past Surgical History:  Past Surgical History:   Procedure Laterality Date   ??? HX ORTHOPAEDIC      Surgery on knee (right knee)        Family History:  Family History   Family history unknown: Yes       Social History:  Social History     Tobacco Use   ??? Smoking status: Former Smoker   ??? Smokeless tobacco: Never Used   Substance Use Topics   ??? Alcohol use: Yes     Comment: rearly   ??? Drug use: Yes     Types: Marijuana       Allergies:  Allergies   Allergen Reactions   ??? Aspirin Other (comments)   ??? Ibuprofen Rash       Review of Systems   Review of Systems   Psychiatric/Behavioral: Positive for behavioral problems, dysphoric mood and sleep disturbance.   All other systems reviewed and are negative.       Physical Exam     Patient Vitals for the past 12 hrs:   Temp Pulse Resp BP SpO2   11/08/17 1048 98.9 ??F (37.2 ??C) 74 18 (!) 135/93 100 %       Physical Exam   Constitutional: He is oriented to person, place, and time. He appears well-developed and well-nourished.   HENT:   Head: Normocephalic and atraumatic.   Eyes: Conjunctivae and EOM are normal.   Neck: Normal range of motion. Neck supple.   Cardiovascular: Normal rate.    Pulmonary/Chest: Effort normal. No respiratory distress.   Musculoskeletal: Normal range of motion.   Neurological: He is alert and oriented to person, place, and time.   Skin: Skin is warm and dry.   Psychiatric: His speech is delayed. He is slowed and withdrawn. He exhibits a depressed mood. He expresses homicidal ideation.       Diagnostic Study Results     Labs -  No results found for this or any previous visit (from the past 12 hour(s)).    Radiologic Studies -   No results found.      Medical Decision Making     ED Course: Progress Notes, Reevaluation, and Consults:    11:00 AM Initial assessment performed. The patients presenting problems have been discussed, and they/their family are in agreement with the care plan formulated and outlined with them.  I have encouraged them to ask questions as they arise throughout their visit.    Team evaluated the patient and they had a phone call from his psychiatrist and they  recommended admission after he has been medically cleared.    Provider Notes (Medical Decision Making): 47 year old male history of PTSD and depression presenting with symptoms of depression and HI.  Patient is well-appearing on exam and has no physical complaints but appears very depressed.  Will get screening labs and consult crisis team    Procedures:     Critical Care Time:     Vital Signs-Reviewed the patient's vital signs. Reviewed pt's pulse ox reading.     EKG:  Interpreted by the EP.   Time Interpreted:    Rate:    Rhythm:    Interpretation:   Comparison:     Records Reviewed: Nursing Notes (Time of Review: 11:00 AM)  -I am the first provider for this patient.  -I reviewed the vital signs, available nursing notes, past medical history, past surgical history, family history and social history.    Current Outpatient Medications   Medication Sig Dispense Refill   ??? gabapentin (NEURONTIN) 300 mg capsule Take 300 mg by mouth.      ??? hydrOXYzine pamoate (VISTARIL) 50 mg capsule TK 1 C PO Q 12 H PRN     ??? fluticasone propionate (FLONASE) 50 mcg/actuation nasal spray 2 Sprays by Nasal route.     ??? prazosin (MINIPRESS) 1 mg capsule TK 1 C PO QHS     ??? venlafaxine (EFFEXOR) 37.5 mg tablet Take 37.5 mg by mouth three (3) times daily.     ??? gabapentin (NEURONTIN) 600 mg tablet Take 1 Tab by mouth three (3) times daily. Max Daily Amount: 1,800 mg. 90 Tab 1        Clinical Impression     Clinical Impression: No diagnosis found.    Disposition: admit    This note was dictated utilizing voice recognition software which may lead to typographical errors.  I apologize in advance if the situation occurs.  If questions arise please do not hesitate to contact me or call our department.    Saharra Santo A Lupe Handley, MD  11:00 AM

## 2017-11-08 NOTE — ED Notes (Signed)
Crisis at bedside.

## 2017-11-09 MED ORDER — VENLAFAXINE SR 150 MG 24 HR CAP
150 mg | ORAL_CAPSULE | Freq: Every day | ORAL | 1 refills | Status: AC
Start: 2017-11-09 — End: ?

## 2017-11-09 MED ORDER — QUETIAPINE 50 MG TAB
50 mg | ORAL_TABLET | Freq: Every evening | ORAL | 0 refills | Status: AC
Start: 2017-11-09 — End: ?

## 2017-11-09 MED FILL — PRAZOSIN 5 MG CAP: 5 mg | ORAL | Qty: 1

## 2017-11-09 MED FILL — QUETIAPINE 25 MG TAB: 25 mg | ORAL | Qty: 2

## 2017-11-09 MED FILL — HYDROXYZINE PAMOATE 50 MG CAP: 50 mg | ORAL | Qty: 1

## 2017-11-09 MED FILL — VENLAFAXINE SR 150 MG 24 HR CAP: 150 mg | ORAL | Qty: 1

## 2017-11-09 MED FILL — TRAZODONE 50 MG TAB: 50 mg | ORAL | Qty: 1

## 2017-11-09 NOTE — Behavioral Health Treatment Team (Signed)
Patient was agitated during the early time of the shift. Patient got his vitals ate breakfast and seem to be sitting quit in the corner. Patient ask the nurse a question and he did not like her response so he grew upset and stressed how much he does not like black woman and how he wants to leave the hospital. Patient contracted for safety and stated he does not feel SI or HI. Staff will continue to monitor and counsel patient throughout the shift.

## 2017-11-09 NOTE — Behavioral Health Treatment Team (Signed)
During this shift (2300-0700), patient has appeared to have slept 8 hours.

## 2017-11-09 NOTE — Behavioral Health Treatment Team (Signed)
Patient discharged with all belongings at this time.  Verbalizes understanding to discharge instructions and follow treatment plan. Escorted to main entrance by [facility].  In no apparent distress at time of discharge.
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
## 2017-11-09 NOTE — Behavioral Health Treatment Team (Signed)
Patient discharged with all belongings at this time.  Verbalizes understanding to discharge instructions and follow treatment plan. Escorted to main entrance by [facility] security officer.  In no apparent distress at time of discharge.

## 2017-11-09 NOTE — Other (Signed)
ART THERAPY GROUP PROGRESS NOTE    Group time: 13:30      The patient did not awaken/get up when called for group.

## 2017-11-09 NOTE — Behavioral Health Treatment Team (Signed)
Patient was agitated during the early time of the shift. Patient got his vitals ate breakfast and seem to be sitting quit in the corner. Patient ask the nurse a question and he did not like her response so he grew upset and stressed how much he does not like black woman and how he wants to leave the hospital. Patient contracted for safety and stated he does not feel SI or HI. Staff will continue to monitor and counsel patient throughout the shift.
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
## 2017-11-09 NOTE — Group Note (Signed)
Date: October 17    Group Start Time: 1145  Group End Time: 1200  Group Topic: Nursing    [facility] 1 ADULT CHEM DEP    Charlynne Cousins, RN          Attendance: Did not attend                          Charlynne Cousins, RN

## 2017-11-10 ENCOUNTER — Encounter: Attending: Orthopaedic Surgery | Primary: Family Medicine

## 2017-11-14 ENCOUNTER — Ambulatory Visit
Admit: 2017-11-14 | Discharge: 2017-11-14 | Payer: PRIVATE HEALTH INSURANCE | Attending: Orthopaedic Surgery | Primary: Family Medicine

## 2017-11-14 ENCOUNTER — Ambulatory Visit: Attending: Orthopaedic Surgery | Primary: Family Medicine

## 2017-11-14 DIAGNOSIS — G5601 Carpal tunnel syndrome, right upper limb: Secondary | ICD-10-CM

## 2017-11-14 NOTE — Progress Notes (Signed)
Progress Notes by Jim Like, MD at 11/14/17 1440                Author: Jim Like, MD  Service: --  Author Type: Physician       Filed: 11/15/17 1132  Encounter Date: 11/14/2017  Status: Signed          Editor: Jim Like, MD (Physician)          Related Notes: Original Note by Jim Like, MD (Physician) filed at 11/14/17 1510                             Patient: Jim Shaw                MRN:  [id number]       SSN: [id number]   Date of Birth: [birth year]         AGE: 47 y.o.        SEX:  male   Body mass index is 39.05 kg/m??.      PCP: [name], MD   11/14/17      Chief Complaint: Right arm numbness/tingling      HISTORY OF PRESENT ILLNESS:  Jim Shaw is a 47 year-old male who presents to the office today with right hand numbness and tingling.  He also has some numbness and tingling in his  forearm.  It has been going on for about a year.  He has been having some back issues, as well as neck issues.  He has seen Dr. Romilda Joy.  He had an MRI of his C-spine and lumbar spine.  He reports numbness and tingling in the entire hand, worse at night.   He does wake up with it at times.  He has not had any specific treatment.                  Past Medical History:        Diagnosis  Date         ?  Costochondritis       ?  Major depressive disorder  11/08/2017         ?  Psychiatric disorder               Family History       Family history unknown: Yes             Current Outpatient Medications          Medication  Sig  Dispense  Refill           ?  gabapentin (NEURONTIN) 600 mg tablet  Take 600 mg by mouth three (3) times daily.         ?  hydrOXYzine HCl (ATARAX) 50 mg tablet  Take 50 mg by mouth as needed for Anxiety.         ?  venlafaxine-SR (EFFEXOR-XR) 150 mg capsule  Take 1 Cap by mouth daily (with breakfast). Indications: Anxiousness associated with Depression  15 Cap  1           ?  QUEtiapine (SEROQUEL) 50 mg tablet  Take 1 Tab by mouth nightly.  Indications: Additional Medications to Treat Depression  1 Tab  0           ?  prazosin (MINIPRESS) 1 mg capsule  TK 1 C PO QHS  Allergies        Allergen  Reactions         ?  Aspirin  Other (comments)     ?  Cortisone  Nausea and Vomiting             Also rash          ?  Ibuprofen  Rash             Past Surgical History:         Procedure  Laterality  Date          ?  HX ORTHOPAEDIC              Surgery on knee (right knee)              Social History          Socioeconomic History         ?  Marital status:  MARRIED              Spouse name:  Not on file         ?  Number of children:  Not on file     ?  Years of education:  Not on file     ?  Highest education level:  Not on file       Occupational History        ?  Not on file       Social Needs         ?  Financial resource strain:  Not on file        ?  Food insecurity:              Worry:  Not on file         Inability:  Not on file        ?  Transportation needs:              Medical:  Not on file         Non-medical:  Not on file       Tobacco Use         ?  Smoking status:  Former Smoker     ?  Smokeless tobacco:  Never Used       Substance and Sexual Activity         ?  Alcohol use:  Yes             Comment: rearly         ?  Drug use:  Yes              Types:  Marijuana         ?  Sexual activity:  Not on file       Lifestyle        ?  Physical activity:              Days per week:  Not on file         Minutes per session:  Not on file         ?  Stress:  Not on file       Relationships        ?  Social connections:              Talks on phone:  Not on file         Gets together:  Not on file  Attends religious service:  Not on file         Active member of club or organization:  Not on file         Attends meetings of clubs or organizations:  Not on file         Relationship status:  Not on file        ?  Intimate partner violence:              Fear of current or ex partner:  Not on file         Emotionally abused:  Not on file          Physically abused:  Not on file         Forced sexual activity:  Not on file        Other Topics  Concern         ?  Military Service  Not Asked     ?  Blood Transfusions  Not Asked     ?  Caffeine Concern  Not Asked     ?  Occupational Exposure  Not Asked     ?  Hobby Hazards  Not Asked     ?  Sleep Concern  Not Asked     ?  Stress Concern  Not Asked     ?  Weight Concern  Not Asked     ?  Special Diet  Not Asked     ?  Back Care  Not Asked     ?  Exercise  Not Asked     ?  Bike Helmet  Not Asked     ?  Seat Belt  Not Asked     ?  Self-Exams  Not Asked       Social History Narrative        ?  Not on file           REVIEW OF SYSTEMS:        CON: negative for recent weight loss/gain, fever, or chills   EYE: negative for double or blurry vision   ENT: negative for hoarseness   RS:   negative for cough, URI, SOB   CV:  negative for chest pain, palpitations   GI:    negative for blood in stool, nausea/vomiting   GU:  negative for blood in urine   MS: As per HPI   Other systems reviewed and noted below.      PHYSICAL EXAMINATION:   Visit Vitals      BP  130/85     Pulse  82     Temp  98.7 ??F (37.1 ??C) (Oral)     Resp  16     Ht  6\' 1"  (1.854 m)     Wt  296 lb (134.3 kg)     SpO2  100%        BMI  39.05 kg/m??        Body mass index is 39.05 kg/m??.      GENERAL: Alert and oriented x3, in no acute distress, well-developed, well-nourished.   HEENT: Normocephalic, atraumatic.     RESP: Non labored breathing with equal chest rise on inspiration.   CV: Well perfused extremities.  No cyanosis or clubbing noted.   ABDOMEN: Soft, non-tender, non-distended.    PHYSICAL EXAM:  Physical exam of the right hand with full range of motion, full strength at the wrist and fingers.  He does have a mildly  positive Tinel's at the carpal and cubital  tunnel.  Mildly positive Phalen's at the carpal tunnel.        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        ASSESSMENT AND PLAN:    Jim Shaw is a 47 year-old male with right arm numbness and tingling.  He has some symptoms of carpal tunnel at night, also some symptoms of cubital tunnel.  He also has some cervical spine pathology.  I would like for him to  get an EMG of the right upper extremity to further evaluate possible nerve compression.  I will plan to see him back after that is completed.  I ordered that today.  I also put him in a wrist brace to wear while he is sleeping, which will hopefully help  with some of his night symptoms.                    Electronically signed by: Jim Like, MD

## 2017-11-14 NOTE — Progress Notes (Signed)
Patient: Jim Shaw                MRN: [id number]       SSN: [id number]  Date of Birth: Apr 01, 1970        AGE: 47 y.o.        SEX: male  Body mass index is 39.05 kg/m??.    PCP: [name], MD  11/14/17    Chief Complaint: Right arm numbness/tingling    HISTORY OF PRESENT ILLNESS:  Jim Shaw is a 47 year-old male who presents to the office today with right hand numbness and tingling.  He also has some numbness and tingling in his forearm.  It has been going on for about a year.  He has been having some back issues, as well as neck issues.  He has seen Dr. Romilda Joy.  He had an MRI of his C-spine and lumbar spine.  He reports numbness and tingling in the entire hand, worse at night.  He does wake up with it at times.  He has not had any specific treatment.           Past Medical History:   Diagnosis Date   ??? Costochondritis    ??? Major depressive disorder 11/08/2017   ??? Psychiatric disorder        Family History   Family history unknown: Yes       Current Outpatient Medications   Medication Sig Dispense Refill   ??? gabapentin (NEURONTIN) 600 mg tablet Take 600 mg by mouth three (3) times daily.     ??? hydrOXYzine HCl (ATARAX) 50 mg tablet Take 50 mg by mouth as needed for Anxiety.     ??? venlafaxine-SR (EFFEXOR-XR) 150 mg capsule Take 1 Cap by mouth daily (with breakfast). Indications: Anxiousness associated with Depression 15 Cap 1   ??? QUEtiapine (SEROQUEL) 50 mg tablet Take 1 Tab by mouth nightly. Indications: Additional Medications to Treat Depression 1 Tab 0   ??? prazosin (MINIPRESS) 1 mg capsule TK 1 C PO QHS         Allergies   Allergen Reactions   ??? Aspirin Other (comments)   ??? Cortisone Nausea and Vomiting     Also rash    ??? Ibuprofen Rash       Past Surgical History:   Procedure Laterality Date   ??? HX ORTHOPAEDIC      Surgery on knee (right knee)        Social History     Socioeconomic History   ??? Marital status: MARRIED     Spouse name: Not on file    ??? Number of children: Not on file   ??? Years of education: Not on file   ??? Highest education level: Not on file   Occupational History   ??? Not on file   Social Needs   ??? Financial resource strain: Not on file   ??? Food insecurity:     Worry: Not on file     Inability: Not on file   ??? Transportation needs:     Medical: Not on file     Non-medical: Not on file   Tobacco Use   ??? Smoking status: Former Smoker   ??? Smokeless tobacco: Never Used   Substance and Sexual Activity   ??? Alcohol use: Yes     Comment: rearly   ??? Drug use: Yes     Types: Marijuana   ??? Sexual activity: Not on file   Lifestyle   ???  Physical activity:     Days per week: Not on file     Minutes per session: Not on file   ??? Stress: Not on file   Relationships   ??? Social connections:     Talks on phone: Not on file     Gets together: Not on file     Attends religious service: Not on file     Active member of club or organization: Not on file     Attends meetings of clubs or organizations: Not on file     Relationship status: Not on file   ??? Intimate partner violence:     Fear of current or ex partner: Not on file     Emotionally abused: Not on file     Physically abused: Not on file     Forced sexual activity: Not on file   Other Topics Concern   ??? Military Service Not Asked   ??? Blood Transfusions Not Asked   ??? Caffeine Concern Not Asked   ??? Occupational Exposure Not Asked   ??? Hobby Hazards Not Asked   ??? Sleep Concern Not Asked   ??? Stress Concern Not Asked   ??? Weight Concern Not Asked   ??? Special Diet Not Asked   ??? Back Care Not Asked   ??? Exercise Not Asked   ??? Bike Helmet Not Asked   ??? Seat Belt Not Asked   ??? Self-Exams Not Asked   Social History Narrative   ??? Not on file       REVIEW OF SYSTEMS:      CON: negative for recent weight loss/gain, fever, or chills  EYE: negative for double or blurry vision  ENT: negative for hoarseness  RS:   negative for cough, URI, SOB  CV:  negative for chest pain, palpitations   GI:    negative for blood in stool, nausea/vomiting  GU:  negative for blood in urine  MS: As per HPI  Other systems reviewed and noted below.    PHYSICAL EXAMINATION:  Visit Vitals  BP 130/85   Pulse 82   Temp 98.7 ??F (37.1 ??C) (Oral)   Resp 16   Ht 6\' 1"  (1.854 m)   Wt 296 lb (134.3 kg)   SpO2 100%   BMI 39.05 kg/m??     Body mass index is 39.05 kg/m??.    GENERAL: Alert and oriented x3, in no acute distress, well-developed, well-nourished.  HEENT: Normocephalic, atraumatic.    RESP: Non labored breathing with equal chest rise on inspiration.  CV: Well perfused extremities.  No cyanosis or clubbing noted.  ABDOMEN: Soft, non-tender, non-distended.   PHYSICAL EXAM:  Physical exam of the right hand with full range of motion, full strength at the wrist and fingers.  He does have a mildly positive Tinel's at the carpal and cubital tunnel.  Mildly positive Phalen's at the carpal tunnel.      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      ASSESSMENT AND PLAN:   Jim Shaw is a 47 year-old male with right arm numbness and tingling.  He has some symptoms of carpal tunnel at night, also some symptoms of cubital tunnel.  He also has some cervical spine pathology.  I would like for him to get an EMG of the right upper extremity to further evaluate possible nerve compression.  I will plan to see him back after that is completed.  I ordered  that today.  I also put him in a wrist brace to wear while he is sleeping, which will hopefully help with some of his night symptoms.              Electronically signed by: Jim Like, MD

## 2017-12-05 ENCOUNTER — Ambulatory Visit
Admit: 2017-12-05 | Discharge: 2017-12-05 | Payer: PRIVATE HEALTH INSURANCE | Attending: Family Medicine | Primary: Family Medicine

## 2017-12-05 ENCOUNTER — Ambulatory Visit: Attending: Family Medicine | Primary: Family Medicine

## 2017-12-05 DIAGNOSIS — M47816 Spondylosis without myelopathy or radiculopathy, lumbar region: Secondary | ICD-10-CM

## 2017-12-05 MED ORDER — TIZANIDINE 4 MG TAB
4 mg | ORAL_TABLET | Freq: Three times a day (TID) | ORAL | 1 refills | Status: AC
Start: 2017-12-05 — End: ?

## 2017-12-05 MED ORDER — PREGABALIN 100 MG CAP
100 mg | ORAL_CAPSULE | Freq: Three times a day (TID) | ORAL | 1 refills | Status: AC
Start: 2017-12-05 — End: ?

## 2017-12-05 MED ORDER — PREGABALIN 75 MG CAP
75 mg | ORAL_CAPSULE | Freq: Two times a day (BID) | ORAL | 0 refills | Status: DC
Start: 2017-12-05 — End: 2018-08-16
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
## 2017-12-05 NOTE — Patient Instructions (Signed)
Low Back Arthritis: Exercises  Introduction  Here are some examples of typical rehabilitation exercises for your condition. Start each exercise slowly. Ease off the exercise if you start to have pain.  Your doctor or physical therapist will tell you when you can start these exercises and which ones will work best for you.  When you are not being active, find a comfortable position for rest. Some people are comfortable on the floor or a medium-firm bed with a small pillow under their head and another under their knees. Some people prefer to lie on their side with a pillow between their knees. Don't stay in one position for too long.  Take short walks (10 to 20 minutes) every 2 to 3 hours. Avoid slopes, hills, and stairs until you feel better. Walk only distances you can manage without pain, especially leg pain.  How to do the exercises  Pelvic tilt    1. Lie on your back with your knees bent.  2. "Brace" your stomach???tighten your muscles by pulling in and imagining your belly button moving toward your spine.  3. Press your lower back into the floor. You should feel your hips and pelvis rock back.  4. Hold for 6 seconds while breathing smoothly.  5. Relax and allow your pelvis and hips to rock forward.  6. Repeat 8 to 12 times.    Back stretches    1. Get down on your hands and knees on the floor.  2. Relax your head and allow it to droop. Round your back up toward the ceiling until you feel a nice stretch in your upper, middle, and lower back. Hold this stretch for as long as it feels comfortable, or about 15 to 30 seconds.  3. Return to the starting position with a flat back while you are on your hands and knees.  4. Let your back sway by pressing your stomach toward the floor. Lift your buttocks toward the ceiling.  5. Hold this position for 15 to 30 seconds.  6. Repeat 2 to 4 times.    Follow-up care is a key part of your treatment and safety. Be sure to make  and go to all appointments, and call your doctor if you are having problems. It's also a good idea to know your test results and keep a list of the medicines you take.  Where can you learn more?  Go to http://www.healthwise.net/[employer].  Enter T094 in the search box to learn more about "Low Back Arthritis: Exercises."  Current as of: July 19, 2017  Content Version: 12.2  ?? 2006-2019 [employer]. Care instructions adapted under license by [employer] (which disclaims liability or warranty for this information). If you have questions about a medical condition or this instruction, always ask your healthcare professional. [employer] disclaims any warranty or liability for your use of this information.
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
## 2017-12-14 ENCOUNTER — Inpatient Hospital Stay: Admit: 2017-12-14 | Payer: MEDICAID | Primary: Family Medicine

## 2017-12-14 DIAGNOSIS — M5416 Radiculopathy, lumbar region: Secondary | ICD-10-CM
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
## 2017-12-14 NOTE — Progress Notes (Signed)
 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]  Radiculopathy, lumbar region [M54.16]    SUBJECTIVE  Pain Level (0-10 scale): 8  Any medication changes, allergies to medications, adverse drug reactions, diagnosis change, or new procedure performed?: [x]  No    []  Yes (see summary sheet for update)  Subjective functional status/changes:   []  No changes reported      OBJECTIVE    Modality rationale:    Min Type Additional Details    []  Estim:  [] Unatt       [] IFC  [] Premod                        [] Other:  [] w/ice   [] w/heat  Position:  Location:    []  Estim: [] Att    [] TENS instruct  [] NMES                    [] Other:  [] w/US    [] w/ice   [] w/heat  Position:  Location:    []   Traction: []  Cervical       [] Lumbar                       []  Prone          [] Supine                       [] Intermittent   [] Continuous Lbs:  []  before manual  []  after manual    []   Ultrasound: [] Continuous   []  Pulsed                           []   [] W/cm2:  Location:    []   Iontophoresis with dexamethasone         Location: []  Take home patch   []  In clinic    []   Ice     []   heat  []   Ice massage  []   Laser   []   Anodyne Position:  Location:    []   Laser with stim  []   Other:  Position:  Location:    []   Vasopneumatic Device Pressure:       []  lo []  med []  hi   Temperature: []  lo []  med []  hi   []  Skin assessment post-treatment:  [] intact [] redness- no adverse reaction    [] redness - adverse reaction:     17 min [x] Eval                  [] Re-Eval       8 min Therapeutic Exercise:  []  See flow sheet :HEP    Rationale: increase ROM and increase strength to improve the patient's ability to perform daily tasks    8 min Therapeutic Activity:  []   See flow sheet :   Rationale: Patient education on diagnosis, prognosis, benefits of aquatic therapy to reduce spinal/joint/disc compression  to improve the  patient's ability to improve therapy outcomes       With   []  TE   []  TA   []  neuro   []  other: Patient Education: [x]  Review HEP    []  Progressed/Changed HEP based on:   []  positioning   []  body mechanics   []  transfers   []  heat/ice application    []  other:      Other Objective/Functional Measures:      Pain Level (0-10 scale) post treatment: 8    ASSESSMENT/Changes in Function:     Patient will continue to benefit from skilled PT services to modify and progress therapeutic interventions, address functional mobility deficits, address ROM deficits, address strength deficits, analyze and address soft tissue restrictions, analyze and cue movement patterns, analyze and modify body mechanics/ergonomics, assess and modify postural abnormalities, address imbalance/dizziness and instruct in home and community integration to attain remaining goals.     [x]   See Plan of Care  []   See progress note/recertification  []   See Discharge Summary         Progress towards goals / Updated goals:  See POC    PLAN  []   Upgrade activities as tolerated     [x]   Continue plan of care  []   Update interventions per flow sheet       []   Discharge due to:_  []   Other:_      Damien DELENA Caras, PT 12/14/2017  11:57 AM    Future Appointments   Date Time Provider Department Center   12/27/2017  9:00 AM Ragena Alm BIRCH, MD [facility] SCHED   12/29/2017  1:00 PM Mee Toribio DASEN, MD [facility] SCHED   01/16/2018  3:00 PM Leonce Zebedee MATSU, MD [facility]ED

## 2017-12-14 NOTE — Progress Notes (Signed)
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]  Radiculopathy, lumbar region [M54.16]    SUBJECTIVE  Pain Level (0-10 scale): 8  Any medication changes, allergies to medications, adverse drug reactions, diagnosis change, or new procedure performed?: [x] No    [] Yes (see summary sheet for update)  Subjective functional status/changes:   [] No changes reported      OBJECTIVE    Modality rationale:    Min Type Additional Details    [] Estim:  []Unatt       []IFC  []Premod                        []Other:  []w/ice   []w/heat  Position:  Location:    [] Estim: []Att    []TENS instruct  []NMES                    []Other:  []w/US   []w/ice   []w/heat  Position:  Location:    []  Traction: [] Cervical       []Lumbar                       [] Prone          []Supine                       []Intermittent   []Continuous Lbs:  [] before manual  [] after manual    []  Ultrasound: []Continuous   [] Pulsed                           []1MHz   []3MHz W/cm2:  Location:    []  Iontophoresis with dexamethasone         Location: [] Take home patch   [] In clinic    []  Ice     []  heat  []  Ice massage  []  Laser   []  Anodyne Position:  Location:    []  Laser with stim  []  Other:  Position:  Location:    []  Vasopneumatic Device Pressure:       [] lo [] med [] hi   Temperature: [] lo [] med [] hi   [] Skin assessment post-treatment:  []intact []redness- no adverse reaction    []redness ??? adverse reaction:     17 min [x]Eval                  []Re-Eval       8 min Therapeutic Exercise:  [] See flow sheet :HEP    Rationale: increase ROM and increase strength to improve the patient???s ability to perform daily tasks    8 min Therapeutic Activity:  []  See flow sheet :   Rationale: Patient education on diagnosis, prognosis, benefits of aquatic therapy to reduce spinal/joint/disc compression  to improve the   patient???s ability to improve therapy outcomes       With   [] TE   [] TA   [] neuro   [] other: Patient Education: [x] Review HEP    [] Progressed/Changed HEP based on:   [] positioning   [] body mechanics   [] transfers   [] heat/ice application    [] other:      Other Objective/Functional Measures:      Pain Level (0-10 scale) post treatment: 8    ASSESSMENT/Changes in Function:     Patient will continue to benefit from skilled PT services to modify and progress therapeutic interventions, address functional mobility deficits, address ROM deficits, address strength deficits, analyze and address soft tissue restrictions, analyze and cue movement patterns, analyze and modify body mechanics/ergonomics, assess and modify postural abnormalities, address imbalance/dizziness and instruct in home and community integration to attain remaining goals.     [x]  See Plan of Care  []  See progress note/recertification  []  See Discharge Summary         Progress towards goals / Updated goals:  See POC    PLAN  []  Upgrade activities as tolerated     [x]  Continue plan of care  []  Update interventions per flow sheet       []  Discharge due to:_  []  Other:_      Aanshi Batchelder A Rowley, PT 12/14/2017  11:57 AM    Future Appointments   Date Time Provider Department Center   12/27/2017  9:00 AM Alcantara, David D, MD [facility]   12/29/2017  1:00 PM Huttman, Daniel T, MD [facility]   01/16/2018  3:00 PM Jackson, Theresa G, MD [facility]ED
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
## 2017-12-20 ENCOUNTER — Encounter: Attending: Physical Medicine & Rehabilitation | Primary: Family Medicine

## 2017-12-25 ENCOUNTER — Ambulatory Visit
Admit: 2017-12-25 | Discharge: 2017-12-25 | Payer: PRIVATE HEALTH INSURANCE | Attending: Family Medicine | Primary: Family Medicine

## 2017-12-25 ENCOUNTER — Ambulatory Visit: Attending: Family Medicine | Primary: Family Medicine

## 2017-12-25 DIAGNOSIS — R29898 Other symptoms and signs involving the musculoskeletal system: Secondary | ICD-10-CM
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
## 2017-12-25 NOTE — Patient Instructions (Addendum)
Low Back Arthritis: Exercises  Introduction  Here are some examples of typical rehabilitation exercises for your condition. Start each exercise slowly. Ease off the exercise if you start to have pain.  Your doctor or physical therapist will tell you when you can start these exercises and which ones will work best for you.  When you are not being active, find a comfortable position for rest. Some people are comfortable on the floor or a medium-firm bed with a small pillow under their head and another under their knees. Some people prefer to lie on their side with a pillow between their knees. Don't stay in one position for too long.  Take short walks (10 to 20 minutes) every 2 to 3 hours. Avoid slopes, hills, and stairs until you feel better. Walk only distances you can manage without pain, especially leg pain.  How to do the exercises  Pelvic tilt    1. Lie on your back with your knees bent.  2. "Brace" your stomach???tighten your muscles by pulling in and imagining your belly button moving toward your spine.  3. Press your lower back into the floor. You should feel your hips and pelvis rock back.  4. Hold for 6 seconds while breathing smoothly.  5. Relax and allow your pelvis and hips to rock forward.  6. Repeat 8 to 12 times.    Back stretches    1. Get down on your hands and knees on the floor.  2. Relax your head and allow it to droop. Round your back up toward the ceiling until you feel a nice stretch in your upper, middle, and lower back. Hold this stretch for as long as it feels comfortable, or about 15 to 30 seconds.  3. Return to the starting position with a flat back while you are on your hands and knees.  4. Let your back sway by pressing your stomach toward the floor. Lift your buttocks toward the ceiling.  5. Hold this position for 15 to 30 seconds.  6. Repeat 2 to 4 times.    Follow-up care is a key part of your treatment and safety. Be sure to make  and go to all appointments, and call your doctor if you are having problems. It's also a good idea to know your test results and keep a list of the medicines you take.  Where can you learn more?  Go to http://www.healthwise.net/[facility].  Enter T094 in the search box to learn more about "Low Back Arthritis: Exercises."  Current as of: July 19, 2017  Content Version: 12.2  ?? 2006-2019 [employer]. Care instructions adapted under license by [facility] (which disclaims liability or warranty for this information). If you have questions about a medical condition or this instruction, always ask your healthcare professional. [employer] disclaims any warranty or liability for your use of this information.           Learning About the Mediterranean Diet  What is the Mediterranean diet?    The Mediterranean diet is a style of eating rather than a diet plan. It features foods eaten in [place], [place], [place] and [place], and other countries along the [place]. It emphasizes eating foods like fish, fruits, vegetables, beans, high-fiber breads and whole grains, nuts, and olive oil. This style of eating includes limited red meat, cheese, and sweets.  Why choose the Mediterranean diet?  A Mediterranean-style diet may improve heart health. It contains more fat than other heart-healthy diets. But the fats   are mainly from nuts, unsaturated oils (such as fish oils and olive oil), and certain nut or seed oils (such as canola, soybean, or flaxseed oil). These fats may help protect the heart and blood vessels.  How can you get started on the Mediterranean diet?  Here are some things you can do to switch to a more Mediterranean way of eating.  What to eat  ?? Eat a variety of fruits and vegetables each day, such as grapes, blueberries, tomatoes, broccoli, peppers, figs, olives, spinach, eggplant, beans, lentils, and chickpeas.   ?? Eat a variety of whole-grain foods each day, such as oats, brown rice, and whole wheat bread, pasta, and couscous.  ?? Eat fish at least 2 times a week. Try tuna, salmon, mackerel, lake trout, herring, or sardines.  ?? Eat moderate amounts of low-fat dairy products, such as milk, cheese, or yogurt.  ?? Eat moderate amounts of poultry and eggs.  ?? Choose healthy (unsaturated) fats, such as nuts, olive oil, and certain nut or seed oils like canola, soybean, and flaxseed.  ?? Limit unhealthy (saturated) fats, such as butter, palm oil, and coconut oil. And limit fats found in animal products, such as meat and dairy products made with whole milk. Try to eat red meat only a few times a month in very small amounts.  ?? Limit sweets and desserts to only a few times a week. This includes sugar-sweetened drinks like soda.  The Mediterranean diet may also include red wine with your meal???1 glass each day for women and up to 2 glasses a day for men.  Tips for eating at home  ?? Use herbs, spices, garlic, lemon zest, and citrus juice instead of salt to add flavor to foods.  ?? Add avocado slices to your sandwich instead of bacon.  ?? Have fish for lunch or dinner instead of red meat. Brush the fish with olive oil, and broil or grill it.  ?? Sprinkle your salad with seeds or nuts instead of cheese.  ?? Cook with olive or canola oil instead of butter or oils that are high in saturated fat.  ?? Switch from 2% milk or whole milk to 1% or fat-free milk.  ?? Dip raw vegetables in a vinaigrette dressing or hummus instead of dips made from mayonnaise or sour cream.  ?? Have a piece of fruit for dessert instead of a piece of cake. Try baked apples, or have some dried fruit.  Tips for eating out  ?? Try broiled, grilled, baked, or poached fish instead of having it fried or breaded.  ?? Ask your server to have your meals prepared with olive oil instead of butter.  ?? Order dishes made with marinara sauce or sauces made from olive oil.  Avoid sauces made from cream or mayonnaise.  ?? Choose whole-grain breads, whole wheat pasta and pizza crust, brown rice, beans, and lentils.  ?? Cut back on butter or margarine on bread. Instead, you can dip your bread in a small amount of olive oil.  ?? Ask for a side salad or grilled vegetables instead of french fries or chips.  Where can you learn more?  Go to http://www.healthwise.net/[facility]Help[facility]ctions.  Enter O407 in the search box to learn more about "Learning About the Mediterranean Diet."  Current as of: November 30, 2016  Content Version: 12.2  ?? 2006-2019 [employer]. Care instructions adapted under license by [facility] (which disclaims liability or warranty for this information). If you have questions about a medical condition   or this instruction, always ask your healthcare professional. [employer] disclaims any warranty or liability for your use of this information.         Learning About the Mediterranean Diet  What is the Mediterranean diet?    The Mediterranean diet is a style of eating rather than a diet plan. It features foods eaten in [place], [place], [place], and other countries along the [employer]. It emphasizes eating foods like fish, fruits, vegetables, beans, high-fiber breads and whole grains, nuts, and olive oil. This style of eating includes limited red meat, cheese, and sweets.  Why choose the Mediterranean diet?  A Mediterranean-style diet may improve heart health. It contains more fat than other heart-healthy diets. But the fats are mainly from nuts, unsaturated oils (such as fish oils and olive oil), and certain nut or seed oils (such as canola, soybean, or flaxseed oil). These fats may help protect the heart and blood vessels.  How can you get started on the Mediterranean diet?  Here are some things you can do to switch to a more Mediterranean way of eating.  What to eat   ?? Eat a variety of fruits and vegetables each day, such as grapes, blueberries, tomatoes, broccoli, peppers, figs, olives, spinach, eggplant, beans, lentils, and chickpeas.  ?? Eat a variety of whole-grain foods each day, such as oats, brown rice, and whole wheat bread, pasta, and couscous.  ?? Eat fish at least 2 times a week. Try tuna, salmon, mackerel, lake trout, herring, or sardines.  ?? Eat moderate amounts of low-fat dairy products, such as milk, cheese, or yogurt.  ?? Eat moderate amounts of poultry and eggs.  ?? Choose healthy (unsaturated) fats, such as nuts, olive oil, and certain nut or seed oils like canola, soybean, and flaxseed.  ?? Limit unhealthy (saturated) fats, such as butter, palm oil, and coconut oil. And limit fats found in animal products, such as meat and dairy products made with whole milk. Try to eat red meat only a few times a month in very small amounts.  ?? Limit sweets and desserts to only a few times a week. This includes sugar-sweetened drinks like soda.  The Mediterranean diet may also include red wine with your meal???1 glass each day for women and up to 2 glasses a day for men.  Tips for eating at home  ?? Use herbs, spices, garlic, lemon zest, and citrus juice instead of salt to add flavor to foods.  ?? Add avocado slices to your sandwich instead of bacon.  ?? Have fish for lunch or dinner instead of red meat. Brush the fish with olive oil, and broil or grill it.  ?? Sprinkle your salad with seeds or nuts instead of cheese.  ?? Cook with olive or canola oil instead of butter or oils that are high in saturated fat.  ?? Switch from 2% milk or whole milk to 1% or fat-free milk.  ?? Dip raw vegetables in a vinaigrette dressing or hummus instead of dips made from mayonnaise or sour cream.  ?? Have a piece of fruit for dessert instead of a piece of cake. Try baked apples, or have some dried fruit.  Tips for eating out  ?? Try broiled, grilled, baked, or poached fish instead of having it fried  or breaded.  ?? Ask your server to have your meals prepared with olive oil instead of butter.  ?? Order dishes made with marinara sauce or sauces made from olive oil. Avoid  sauces made from cream or mayonnaise.  ?? Choose whole-grain breads, whole wheat pasta and pizza crust, brown rice, beans, and lentils.  ?? Cut back on butter or margarine on bread. Instead, you can dip your bread in a small amount of olive oil.  ?? Ask for a side salad or grilled vegetables instead of french fries or chips.  Where can you learn more?  Go to [employer]cahttp://www.healthwise.net/[facility]Help[facility]ctions.  Enter O407 in the search box to learn more about "Learning About the Mediterranean Diet."  Current as of: November 30, 2016  Content Version: 12.2  ?? 2006-2019 [employer]. Care instructions adapted under license by [facility] (which disclaims liability or warranty for this information). If you have questions about a medical condition or this instruction, always ask your healthcare professional. [employer] disclaims any warranty or liability for your use of this information.         When You Are Overweight: Care Instructions  Your Care Instructions    If you're overweight, your doctor may recommend that you make changes in your eating and exercise habits. Being overweight can lead to serious health problems, such as high blood pressure, heart disease, type 2 diabetes, and arthritis, or it can make these problems worse. Eating a healthy diet and being more active can help you reach and stay at a healthy weight.  You don't have to make huge changes all at once. Start by making small changes in your eating and exercise habits. To lose weight, you need to burn more calories than you take in. You can do this by eating healthy foods in reasonable amounts and becoming more active every day.  Follow-up care is a key part of your treatment and safety. Be sure to make  and go to all appointments, and call your doctor if you are having problems. It's also a good idea to know your test results and keep a list of the medicines you take.  How can you care for yourself at home?  ?? Improve your eating habits. You'll be more successful if you work on changing one eating habit at a time. All foods, if eaten in moderation, can be part of healthy eating. Remember to:  ? Eat a variety of foods from each food group. Include grains, vegetables, fruits, dairy, and protein foods.  ? Limit foods high in fat, sugar, and calories.  ? Eat slowly. And don't do anything else, such as watch TV, while you are eating.  ? Pay attention to portion sizes. Put your food on a smaller plate.  ? Plan your meals ahead of time. You'll be less likely to grab something that's not as healthy.  ?? Get active. Regular activity can help you feel better, have more energy, and burn more calories. If you haven't been active, start slowly. Start with at least 30 minutes of moderate activity on most days of the week. Then gradually increase the amount of activity. Try for 60 or 90 minutes a day, at least 5 days a week. There are a lot of ways to fit activity into your life. You can:  ? Walk or bike to the store. Or walk with a friend, or walk the dog.  ? Mow the lawn, rake leaves, shovel snow, or do some gardening.  ? Use the stairs instead of the elevator, at least for a few floors.  ?? Change your thinking. Your thoughts have a lot to do with how you feel and what you do. When you're trying  to reach a healthy weight, changing how you think about certain things may help. Here are some ideas:  ? Don't compare yourself to others. Healthy bodies come in all shapes and sizes.  ? Pay attention to how hungry or full you feel. When you eat, be aware of why you're eating and how much you're eating.  ? Focus on improving your health instead of dieting. Dieting almost never works over the long term.   ?? Ask your doctor about other health professionals who can help you reach a healthy weight.  ? A dietitian can help you make healthy changes in your diet.  ? An exercise specialist or personal trainer can help you develop a safe and effective exercise program.  ? A counselor or psychiatrist can help you cope with issues such as depression, anxiety, or family problems that can make it hard to focus on reaching a healthy weight.  ?? Get support from your family, your doctor, your friends, a support group???and support yourself.  Where can you learn more?  Go to [employer].  Enter [id number] in the search box to learn more about "When You Are Overweight: Care Instructions."  Current as of: April 20, 2017  Content Version: 12.2  ?? 2006-2019 [employer]. Care instructions adapted under license by [facility] (which disclaims liability or warranty for this information). If you have questions about a medical condition or this instruction, always ask your healthcare professional. [employer] disclaims any warranty or liability for your use of this information.
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
## 2017-12-26 ENCOUNTER — Inpatient Hospital Stay: Admit: 2017-12-26 | Payer: MEDICAID | Primary: Family Medicine

## 2017-12-26 DIAGNOSIS — M5416 Radiculopathy, lumbar region: Secondary | ICD-10-CM

## 2017-12-27 ENCOUNTER — Ambulatory Visit
Admit: 2017-12-27 | Discharge: 2017-12-27 | Payer: PRIVATE HEALTH INSURANCE | Attending: Physical Medicine & Rehabilitation | Primary: Family Medicine

## 2017-12-27 ENCOUNTER — Ambulatory Visit: Attending: Physical Medicine & Rehabilitation | Primary: Family Medicine

## 2017-12-27 DIAGNOSIS — R200 Anesthesia of skin: Secondary | ICD-10-CM

## 2017-12-27 NOTE — Progress Notes (Signed)
Progress Notes by Jim Sailors, Jim Shaw at 12/27/17 0900                Author: Brooks Sailors, Jim Shaw  Service: --  Author Type: Physician       Filed: 12/27/17 1044  Encounter Date: 12/27/2017  Status: Signed          Editor: Jim Sailors, Jim Shaw (Physician)                        [facility]  [address]   [place], [place] [address]   Phone: [phone]   Fax: [phone]            Jim Shaw, Jim Shaw   DOB: [birth year]   PCP: Jim Decree, Jim Shaw   12/27/2017      ELECTROMYOGRAPHY AND NERVE CONDUCTION STUDIES      Jim Shaw was referred by Dr. Guido Sander for electrodiagnostic evaluation of right hand numbness and  tingling.      NCV & EMG Findings:   All nerve conduction studies (as indicated in the following tables) were within normal limits.        All examined muscles (as indicated in the following table) showed no evidence of electrical instability.        INTERPRETATION   This was a normal nerve conduction and EMG study showing there to be  no signs of neuropathy, myopathy, or radiculopathy in the nerves and muscles tested.       CLINICAL INTERPRETATION   There are no electrodiagnostic findings correlating with his symptoms.            HISTORY OF PRESENT ILLNESS  Jim Shaw  is a 47 y.o. male.      Pt presents today for RUE EMG evaluation of right hand numbness and tingling x 1 year, mostly in the ring and index fingers. He also c/o some numbness and tingling in his forearm. His symptoms are worse at night and sometimes cause him to wake up at night.  Pt notes that he has had instances where his fingers would "move on their own. I had no control over them." Today, Pt also mentions some paraesthesia in his left hand. He will be seen at a later date for a RLE EMG for RLE paraesthesia for which he sees  Jim Shaw.      Cervical MRI 10/13/17:   1. Straightened cervical lordosis. No compression deformity, edema or  listhesis.    2. 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  -Ventral cord contact and minimal distortion due to disc bulge C5-C6 as above.    3. Varying degrees of bilateral foraminal narrowing most significant right C4-5 and left C5-6.            PAST MEDICAL HISTORY      Past Medical History:        Diagnosis  Date         ?  Costochondritis       ?  Major depressive disorder  11/08/2017         ?  Psychiatric disorder               Past Surgical History:         Procedure  Laterality  Date          ?  HX ORTHOPAEDIC  Surgery on knee (right knee)      .          MEDICATIONS     Current Outpatient Medications          Medication  Sig  Dispense  Refill           ?  celecoxib (CELEBREX) 100 mg capsule  Take 1 Cap by mouth daily.         ?  tiZANidine (ZANAFLEX) 4 mg tablet  Take 1 Tab by mouth three (3) times daily.  90 Tab  1     ?  pregabalin (LYRICA) 75 mg capsule  Take 1 Cap by mouth two (2) times a day. Max Daily Amount: 150 mg. (Patient not taking: Reported on 12/25/2017)  28 Cap  0     ?  pregabalin (LYRICA) 100 mg capsule  Take 1 Cap by mouth three (3) times daily. Max Daily Amount: 300 mg.  90 Cap  1     ?  gabapentin (NEURONTIN) 600 mg tablet  Take 600 mg by mouth three (3) times daily.         ?  hydrOXYzine HCl (ATARAX) 50 mg tablet  Take 50 mg by mouth as needed for Anxiety.         ?  venlafaxine-SR (EFFEXOR-XR) 150 mg capsule  Take 1 Cap by mouth daily (with breakfast). Indications: Anxiousness associated with Depression  15 Cap  1     ?  QUEtiapine (SEROQUEL) 50 mg tablet  Take 1 Tab by mouth nightly. Indications: Additional Medications to Treat Depression (Patient taking differently: Take 200 mg by mouth nightly. Indications:  Additional Medications to Treat Depression)  1 Tab  0           ?  prazosin (MINIPRESS) 1 mg capsule  TK 1 C PO QHS                  ALLERGIES   Allergies        Allergen  Reactions         ?   Oxycodone-Acetaminophen  Anaphylaxis and Swelling     ?  Duloxetine Hcl  Rash     ?  Aspirin  Other (comments)     ?  Cortisone  Nausea and Vomiting             Also rash          ?  Ibuprofen  Rash              SOCIAL HISTORY       Social History          Socioeconomic History         ?  Marital status:  MARRIED              Spouse name:  Not on file         ?  Number of children:  Not on file     ?  Years of education:  Not on file     ?  Highest education level:  Not on file       Tobacco Use         ?  Smoking status:  Former Smoker     ?  Smokeless tobacco:  Never Used       Substance and Sexual Activity         ?  Alcohol use:  Yes  Comment: rearly         ?  Drug use:  Yes              Types:  Marijuana        Other Topics  Concern           FAMILY HISTORY     Family History         Problem  Relation  Age of Onset          ?  No Known Problems  Mother            ?  No Known Problems  Father                PHYSICAL EXAMINATION   Visit Vitals      BP  118/69     Pulse  79     Ht  6' (1.829 m)     Wt  315 lb (142.9 kg)        BMI  42.72 kg/m??              Pain Assessment   12/25/2017        Location of Pain  Neck;Arm;Hand;Back     Location Modifiers  Left;Right     Severity of Pain  8     Quality of Pain  Aching;Throbbing;Burning;Sharp     Quality of Pain Comment  -     Duration of Pain  Persistent     Frequency of Pain  Constant     Aggravating Factors  -     Limiting Behavior  Yes     Relieving Factors  -     Result of Injury  No     Work-Related Injury  -        Type of Injury  -                 Constitutional:  Well developed, well nourished, in no acute distress.    Psychiatric: Affect and mood are appropriate.   Integumentary : No rashes or abrasions noted on exposed areas.          SPINE/MUSCULOSKELETAL EXAM      On brief examination: Mildly positive Tinel's at the carpal and cubital tunnel. Mildly positive Phalen's at the carpal tunnel.      MOTOR:                Biceps   Triceps  Deltoids   Wrist Ext  Wrist Flex  Hand Intrin             Right  5/5  5/5  5/5  5/5  5/5  5/5             Left  5/5  5/5  5/5  5/5  5/5  5/5                                        Hip Flex   Quads  Hamstrings  Ankle DF  EHL  Ankle PF             Right  5/5  5/5  5/5  5/5  5/5  5/5             Left  5/5  5/5  5/5  5/5  5/5  5/5        NCV & EMG Findings:   Nerve  Conduction Studies   Anti Sensory Summary Table                    Stim Site  NR  Peak (ms)  Norm Peak (ms)  O-P Amp (??V)  Norm O-P Amp  Site1  Site2  Delta-P (ms)  Dist (cm)  Vel (m/s)  Norm Vel (m/s)       Right Median Anti Sensory (2nd Digit)                  Wrist      3.2  <3.6  15.6  >10  Wrist  2nd Digit  3.2  14.0  44  >39       Right Radial Anti Sensory (Base 1st Digit)                  Wrist      2.3  <3.1  29.7    Wrist  Base 1st Digit  2.3  0.0           Right Ulnar Anti Sensory (5th Digit)                  Wrist      3.3  <3.7  18.4  >15.0  Wrist  5th Digit  3.3  14.0  42  >38        Motor Summary Table                    Stim Site  NR  Onset (ms)  Norm Onset (ms)  O-P Amp (mV)  Norm O-P Amp  Site1  Site2  Delta-0 (ms)  Dist (cm)  Vel (m/s)  Norm Vel (m/s)       Right Median Motor (Abd Poll Brev)                  Wrist      3.6  <4.2  13.7  >5  Elbow  Wrist  4.7  25.0  53  >50                  Elbow      8.3    12.7                     Right Ulnar Motor (Abd Dig Min)                  Wrist      2.9  <4.2  6.1  >3  B Elbow  Wrist  3.9  22.5  58  >53     B Elbow      6.8    5.2    A Elbow  B Elbow  1.9  10.0  53  >53                  A Elbow      8.7    5.2                      EMG                     Side  Muscle  Nerve  Root  Ins Act  Fibs  Psw  Amp  Dur  Poly  Recrt  Int Dennie Bible  Comment                   Right  Biceps  Musculocut  C5-6  Nml  Nml  Nml  Nml  Nml  0  Nml  Nml       Right  Triceps  Radial  C6-7-8  Nml  Nml  Nml  Nml  Nml  0  Nml  Nml       Right  PronatorTeres  Median  C6-7  Nml  Nml  Nml  Nml  Nml  0  Nml  Nml       Right  Abd Poll Brev  Median   C8-T1  Nml  Nml  Nml  Nml  Nml  0  Nml  Nml       Right  1stDorInt  Ulnar  C8-T1  Nml  Nml  Nml  Nml  Nml  0  Nml  Nml       Right  Cervical Parasp Up  Rami  C1-3  Nml  Nml  Nml                 Right  Cervical Parasp Mid  Rami  C4-6  Nml  Nml  Nml                               Right  Cervical Parasp Low  Rami  C7-8  Nml  Nml  Nml                       Nerve Conduction Studies   Anti Sensory Left/Right Comparison                    Stim Site  L Lat (ms)  R Lat (ms)  L-R Lat (ms)  L Amp (??V)  R Amp (??V)  L-R Amp (%)  Site1  Site2  L Vel (m/s)  R Vel (m/s)  L-R Vel (m/s)       Median Anti Sensory (2nd Digit)                  Wrist    3.2      15.6    Wrist  2nd Digit    44         Radial Anti Sensory (Base 1st Digit)                  Wrist    2.3      29.7    Wrist  Base 1st Digit             Ulnar Anti Sensory (5th Digit)                  Wrist    3.3      18.4    Wrist  5th Digit    42          Motor Left/Right Comparison                    Stim Site  L Lat (ms)  R Lat (ms)  L-R Lat (ms)  L Amp (mV)  R Amp (mV)  L-R Amp (%)  Site1  Site2  L Vel (m/s)  R Vel (m/s)  L-R Vel (m/s)       Median Motor (Abd Poll Brev)                  Wrist    3.6      13.7    Elbow  Wrist  53                    Elbow    8.3      12.7                   Ulnar Motor (Abd Dig Min)                  Wrist    2.9      6.1    B Elbow  Wrist    58       B Elbow    6.8      5.2    A Elbow  B Elbow    53                    A Elbow    8.7      5.2                         Waveforms:                                       [facility]   OFFICE PROCEDURE PROGRESS NOTE            Chart reviewed for the following:    I, Jim Shaw, have reviewed the History, Physical and updated the Allergic reactions for [facility]        TIME OUT performed immediately prior to start of procedure:    I, Jim Shaw, have performed the following reviews on [facility]  prior to the start of the  procedure:              * Patient was identified by name and date of birth    * Agreement on procedure being performed was verified   * Risks and Benefits explained to the patient   * Procedure site verified and marked as necessary   * Patient was positioned for comfort   * Consent was signed and verified       Time: 9:29 AM         Date of procedure: 12/27/2017      Procedure performed by:  Jim Sailorsavid D Rashad Obeid, Jim Shaw      Provider accompanied by: Scribe.      Patient accompanied by: None.      How tolerated by patient: tolerated the procedure well with no complications      Post Procedural Pain Scale: 0 - No Hurt      Comments: none      Written by Jim Shaw, Jim Shaw, Jim Brakebill, Jim Shaw

## 2017-12-27 NOTE — Progress Notes (Signed)
[facility]  [address]  [place], [place] [address]  Phone: [phone]  Fax: [phone]        Jim Shaw, Jim Shaw  DOB: 1970/10/26  PCP: [name], MD  12/27/2017    ELECTROMYOGRAPHY AND NERVE CONDUCTION STUDIES    Jim Shaw was referred by Dr. Guido Sander for electrodiagnostic evaluation of right hand numbness and tingling.    NCV & EMG Findings:  All nerve conduction studies (as indicated in the following tables) were within normal limits.      All examined muscles (as indicated in the following table) showed no evidence of electrical instability.      INTERPRETATION  This was a normal nerve conduction and EMG study showing there to be no signs of neuropathy, myopathy, or radiculopathy in the nerves and muscles tested.     CLINICAL INTERPRETATION  There are no electrodiagnostic findings correlating with his symptoms.         HISTORY OF PRESENT ILLNESS  Jim Shaw is a 47 y.o. male.    Pt presents today for RUE EMG evaluation of right hand numbness and tingling x 1 year, mostly in the ring and index fingers. He also c/o some numbness and tingling in his forearm. His symptoms are worse at night and sometimes cause him to wake up at night. Pt notes that he has had instances where his fingers would "move on their own. I had no control over them." Today, Pt also mentions some paraesthesia in his left hand. He will be seen at a later date for a RLE EMG for RLE paraesthesia for which he sees Dr. Jean Rosenthal.    Cervical MRI 10/13/17:  1. Straightened cervical lordosis. No compression deformity, edema or listhesis.    2. Multilevel degenerative discogenic disease with mild degree of canal narrowing at several levels.  -Ventral cord contact and minimal distortion due to disc bulge C5-C6 as above.    3. Varying degrees of bilateral foraminal narrowing most significant right C4-5 and left C5-6.        PAST MEDICAL HISTORY    Past Medical History:   Diagnosis Date   ??? Costochondritis    ??? Major depressive disorder 11/08/2017   ??? Psychiatric disorder        Past Surgical History:   Procedure Laterality Date   ??? HX ORTHOPAEDIC      Surgery on knee (right knee)    .      MEDICATIONS    Current Outpatient Medications   Medication Sig Dispense Refill   ??? celecoxib (CELEBREX) 100 mg capsule Take 1 Cap by mouth daily.     ??? tiZANidine (ZANAFLEX) 4 mg tablet Take 1 Tab by mouth three (3) times daily. 90 Tab 1   ??? pregabalin (LYRICA) 75 mg capsule Take 1 Cap by mouth two (2) times a day. Max Daily Amount: 150 mg. (Patient not taking: Reported on 12/25/2017) 28 Cap 0   ??? pregabalin (LYRICA) 100 mg capsule Take 1 Cap by mouth three (3) times daily. Max Daily Amount: 300 mg. 90 Cap 1   ??? gabapentin (NEURONTIN) 600 mg tablet Take 600 mg by mouth three (3) times daily.     ??? hydrOXYzine HCl (ATARAX) 50 mg tablet Take 50 mg by mouth as needed for Anxiety.     ??? venlafaxine-SR (EFFEXOR-XR) 150 mg capsule Take 1 Cap by mouth daily (with breakfast). Indications: Anxiousness associated with Depression 15 Cap 1   ??? QUEtiapine (  SEROQUEL) 50 mg tablet Take 1 Tab by mouth nightly. Indications: Additional Medications to Treat Depression (Patient taking differently: Take 200 mg by mouth nightly. Indications: Additional Medications to Treat Depression) 1 Tab 0   ??? prazosin (MINIPRESS) 1 mg capsule TK 1 C PO QHS          ALLERGIES  Allergies   Allergen Reactions   ??? Oxycodone-Acetaminophen Anaphylaxis and Swelling   ??? Duloxetine Hcl Rash   ??? Aspirin Other (comments)   ??? Cortisone Nausea and Vomiting     Also rash    ??? Ibuprofen Rash          SOCIAL HISTORY    Social History     Socioeconomic History   ??? Marital status: MARRIED     Spouse name: Not on file   ??? Number of children: Not on file   ??? Years of education: Not on file   ??? Highest education level: Not on file   Tobacco Use   ??? Smoking status: Former Smoker   ??? Smokeless tobacco: Never Used    Substance and Sexual Activity   ??? Alcohol use: Yes     Comment: rearly   ??? Drug use: Yes     Types: Marijuana   Other Topics Concern       FAMILY HISTORY    Family History   Problem Relation Age of Onset   ??? No Known Problems Mother    ??? No Known Problems Father          PHYSICAL EXAMINATION  Visit Vitals  BP 118/69   Pulse 79   Ht 6' (1.829 m)   Wt 315 lb (142.9 kg)   BMI 42.72 kg/m??       Pain Assessment  12/25/2017   Location of Pain Neck;Arm;Hand;Back   Location Modifiers Left;Right   Severity of Pain 8   Quality of Pain Aching;Throbbing;Burning;Sharp   Quality of Pain Comment -   Duration of Pain Persistent   Frequency of Pain Constant   Aggravating Factors -   Limiting Behavior Yes   Relieving Factors -   Result of Injury No   Work-Related Injury -   Type of Injury -           Constitutional:  Well developed, well nourished, in no acute distress.   Psychiatric: Affect and mood are appropriate.   Integumentary: No rashes or abrasions noted on exposed areas.        SPINE/MUSCULOSKELETAL EXAM    On brief examination: Mildly positive Tinel's at the carpal and cubital tunnel. Mildly positive Phalen's at the carpal tunnel.    MOTOR:      Biceps  Triceps Deltoids Wrist Ext Wrist Flex Hand Intrin   Right 5/5 5/5 5/5 5/5 5/5 5/5   Left 5/5 5/5 5/5 5/5 5/5 5/5             Hip Flex  Quads Hamstrings Ankle DF EHL Ankle PF   Right 5/5 5/5 5/5 5/5 5/5 5/5   Left 5/5 5/5 5/5 5/5 5/5 5/5     NCV & EMG Findings:  Nerve Conduction Studies  Anti Sensory Summary Table     Stim Site NR Peak (ms) Norm Peak (ms) O-P Amp (??V) Norm O-P Amp Site1 Site2 Delta-P (ms) Dist (cm) Vel (m/s) Norm Vel (m/s)   Right Median Anti Sensory (2nd Digit)   Wrist    3.2 <3.6 15.6 >10 Wrist 2nd Digit 3.2 14.0 44 >39   Right Radial Anti  Sensory (Base 1st Digit)   Wrist    2.3 <3.1 29.7  Wrist Base 1st Digit 2.3 0.0     Right Ulnar Anti Sensory (5th Digit)   Wrist    3.3 <3.7 18.4 >15.0 Wrist 5th Digit 3.3 14.0 42 >38     Motor Summary Table      Stim Site NR Onset (ms) Norm Onset (ms) O-P Amp (mV) Norm O-P Amp Site1 Site2 Delta-0 (ms) Dist (cm) Vel (m/s) Norm Vel (m/s)   Right Median Motor (Abd Poll Brev)   Wrist    3.6 <4.2 13.7 >5 Elbow Wrist 4.7 25.0 53 >50   Elbow    8.3  12.7          Right Ulnar Motor (Abd Dig Min)   Wrist    2.9 <4.2 6.1 >3 B Elbow Wrist 3.9 22.5 58 >53   B Elbow    6.8  5.2  A Elbow B Elbow 1.9 10.0 53 >53   A Elbow    8.7  5.2            EMG     Side Muscle Nerve Root Ins Act Fibs Psw Amp Dur Poly Recrt Int Dennie Bible Comment   Right Biceps Musculocut C5-6 Nml Nml Nml Nml Nml 0 Nml Nml    Right Triceps Radial C6-7-8 Nml Nml Nml Nml Nml 0 Nml Nml    Right PronatorTeres Median C6-7 Nml Nml Nml Nml Nml 0 Nml Nml    Right Abd Poll Brev Median C8-T1 Nml Nml Nml Nml Nml 0 Nml Nml    Right 1stDorInt Ulnar C8-T1 Nml Nml Nml Nml Nml 0 Nml Nml    Right Cervical Parasp Up Rami C1-3 Nml Nml Nml         Right Cervical Parasp Mid Rami C4-6 Nml Nml Nml         Right Cervical Parasp Low Rami C7-8 Nml Nml Nml             Nerve Conduction Studies  Anti Sensory Left/Right Comparison     Stim Site L Lat (ms) R Lat (ms) L-R Lat (ms) L Amp (??V) R Amp (??V) L-R Amp (%) Site1 Site2 L Vel (m/s) R Vel (m/s) L-R Vel (m/s)   Median Anti Sensory (2nd Digit)   Wrist  3.2   15.6  Wrist 2nd Digit  44    Radial Anti Sensory (Base 1st Digit)   Wrist  2.3   29.7  Wrist Base 1st Digit      Ulnar Anti Sensory (5th Digit)   Wrist  3.3   18.4  Wrist 5th Digit  42      Motor Left/Right Comparison     Stim Site L Lat (ms) R Lat (ms) L-R Lat (ms) L Amp (mV) R Amp (mV) L-R Amp (%) Site1 Site2 L Vel (m/s) R Vel (m/s) L-R Vel (m/s)   Median Motor (Abd Poll Brev)   Wrist  3.6   13.7  Elbow Wrist  53    Elbow  8.3   12.7         Ulnar Motor (Abd Dig Min)   Wrist  2.9   6.1  B Elbow Wrist  58    B Elbow  6.8   5.2  A Elbow B Elbow  53    A Elbow  8.7   5.2               Waveforms:  [facility]  OFFICE PROCEDURE PROGRESS NOTE         Chart reviewed for the following:   I, Teresa Peltonavid Noell Shular, have reviewed the History, Physical and updated the Allergic reactions for Jim Shaw     TIME OUT performed immediately prior to start of procedure:   I, Teresa Peltonavid Anthonee Gelin, have performed the following reviews on [facility] prior to the start of the procedure:            * Patient was identified by name and date of birth   * Agreement on procedure being performed was verified  * Risks and Benefits explained to the patient  * Procedure site verified and marked as necessary  * Patient was positioned for comfort  * Consent was signed and verified     Time: 9:29 AM      Date of procedure: 12/27/2017    Procedure performed by:  Brooks Sailorsavid D Treyshon Buchanon, MD    Provider accompanied by: Scribe.    Patient accompanied by: None.    How tolerated by patient: tolerated the procedure well with no complications    Post Procedural Pain Scale: 0 - No Hurt    Comments: none    Written by Lynnell GrainAlexis Park, Scribekick as dictated by Teresa Peltonavid, Kynzie Polgar, MD

## 2017-12-28 ENCOUNTER — Inpatient Hospital Stay: Admit: 2017-12-28 | Payer: MEDICAID | Primary: Family Medicine

## 2017-12-29 ENCOUNTER — Ambulatory Visit
Admit: 2017-12-29 | Discharge: 2017-12-29 | Payer: PRIVATE HEALTH INSURANCE | Attending: Orthopaedic Surgery | Primary: Family Medicine

## 2017-12-29 ENCOUNTER — Ambulatory Visit: Attending: Orthopaedic Surgery | Primary: Family Medicine

## 2017-12-29 DIAGNOSIS — R200 Anesthesia of skin: Secondary | ICD-10-CM

## 2017-12-29 NOTE — Progress Notes (Signed)
1. Have you been to the ER, urgent care clinic since your last visit?  Hospitalized since your last visit?No    2. Have you seen or consulted any other health care providers outside of the [facility] since your last visit?  Include any pap smears or colon screening. No

## 2017-12-29 NOTE — Progress Notes (Signed)
Progress Notes by Jim LikeHuttman, Duc Crocket T, MD at 12/29/17 1300                Author: Jim LikeHuttman, Kyley Solow T, MD  Service: --  Author Type: Physician       Filed: 01/01/18 0713  Encounter Date: 12/29/2017  Status: Signed          Editor: Jim LikeHuttman, Amanee Iacovelli T, MD (Physician)          Related Notes: Original Note by Jim LikeHuttman, Jahmiya Guidotti T, MD (Physician) filed at 12/29/17 1333                             Patient: Jim Shaw                MRN:  [id number]       SSN: [id number]   Date of Birth: Jun 23, 1970         AGE: 47 y.o.        SEX:  male   Body mass index is 42.59 kg/m??.      PCP: Eliane DecreeZamani, Troi R, MD   12/29/17      Chief Complaint: Bilateral arm numbness/tingling      HISTORY OF PRESENT ILLNESS:  Jim Shaw returns to the office today for his right arm.  Since his last visit, he has started to have some numbness and tingling in his left arm.   He had an EMG done of the right upper extremity.  This was negative for any signs of carpal or cubital tunnel.  He is having bilateral hand numbness and tingling.  It comes on sporadically.  He says he has to shake his hands out to make it feel better.                    Past Medical History:        Diagnosis  Date         ?  Costochondritis       ?  Major depressive disorder  11/08/2017         ?  Psychiatric disorder               Family History         Problem  Relation  Age of Onset          ?  No Known Problems  Mother            ?  No Known Problems  Father               Current Outpatient Medications          Medication  Sig  Dispense  Refill           ?  celecoxib (CELEBREX) 100 mg capsule  Take 1 Cap by mouth daily.         ?  tiZANidine (ZANAFLEX) 4 mg tablet  Take 1 Tab by mouth three (3) times daily.  90 Tab  1     ?  pregabalin (LYRICA) 75 mg capsule  Take 1 Cap by mouth two (2) times a day. Max Daily Amount: 150 mg.  28 Cap  0           ?  pregabalin (LYRICA) 100 mg capsule  Take 1 Cap by mouth three (3) times daily. Max Daily Amount: 300 mg.  90 Cap   1           ?  gabapentin (NEURONTIN) 600 mg tablet  Take 600 mg by mouth three (3) times daily.         ?  hydrOXYzine HCl (ATARAX) 50 mg tablet  Take 50 mg by mouth as needed for Anxiety.         ?  venlafaxine-SR (EFFEXOR-XR) 150 mg capsule  Take 1 Cap by mouth daily (with breakfast). Indications: Anxiousness associated with Depression  15 Cap  1     ?  QUEtiapine (SEROQUEL) 50 mg tablet  Take 1 Tab by mouth nightly. Indications: Additional Medications to Treat Depression (Patient taking differently: Take 200 mg by mouth nightly. Indications:  Additional Medications to Treat Depression)  1 Tab  0           ?  prazosin (MINIPRESS) 1 mg capsule  TK 1 C PO QHS                 Allergies        Allergen  Reactions         ?  Oxycodone-Acetaminophen  Anaphylaxis and Swelling     ?  Duloxetine Hcl  Rash     ?  Aspirin  Other (comments)     ?  Cortisone  Nausea and Vomiting             Also rash          ?  Ibuprofen  Rash             Past Surgical History:         Procedure  Laterality  Date          ?  HX ORTHOPAEDIC              Surgery on knee (right knee)              Social History          Socioeconomic History         ?  Marital status:  MARRIED              Spouse name:  Not on file         ?  Number of children:  Not on file     ?  Years of education:  Not on file     ?  Highest education level:  Not on file       Occupational History        ?  Not on file       Social Needs         ?  Financial resource strain:  Not on file        ?  Food insecurity:              Worry:  Not on file         Inability:  Not on file        ?  Transportation needs:              Medical:  Not on file         Non-medical:  Not on file       Tobacco Use         ?  Smoking status:  Former Smoker     ?  Smokeless tobacco:  Never Used       Substance and Sexual Activity         ?  Alcohol use:  Yes  Comment: rearly         ?  Drug use:  Yes              Types:  Marijuana         ?  Sexual activity:  Not on file        Lifestyle        ?  Physical activity:              Days per week:  Not on file         Minutes per session:  Not on file         ?  Stress:  Not on file       Relationships        ?  Social connections:              Talks on phone:  Not on file         Gets together:  Not on file         Attends religious service:  Not on file         Active member of club or organization:  Not on file         Attends meetings of clubs or organizations:  Not on file         Relationship status:  Not on file        ?  Intimate partner violence:              Fear of current or ex partner:  Not on file         Emotionally abused:  Not on file         Physically abused:  Not on file         Forced sexual activity:  Not on file        Other Topics  Concern         ?  Military Service  Not Asked     ?  Blood Transfusions  Not Asked     ?  Caffeine Concern  Not Asked     ?  Occupational Exposure  Not Asked     ?  Hobby Hazards  Not Asked     ?  Sleep Concern  Not Asked     ?  Stress Concern  Not Asked     ?  Weight Concern  Not Asked     ?  Special Diet  Not Asked     ?  Back Care  Not Asked     ?  Exercise  Not Asked     ?  Bike Helmet  Not Asked     ?  Seat Belt  Not Asked     ?  Self-Exams  Not Asked       Social History Narrative        ?  Not on file           REVIEW OF SYSTEMS:        No changes from previous review of systems unless noted.      PHYSICAL EXAMINATION:   Visit Vitals      BP  124/72     Pulse  72     Temp  96.8 ??F (36 ??C) (Oral)     Resp  16     Ht  6' (1.829 m)     Wt  314 lb (142.4 kg)        BMI  42.59 kg/m??        Body mass index is 42.59 kg/m??.   GENERAL: Alert and oriented x3, in no acute distress.   HEENT: Normocephalic, atraumatic.     RESP: Non labored breathing.   SKIN: No rashes or lesions noted.    PHYSICAL EXAM:  Physical exam of both upper extremities with no focal neurological deficits.  His sensation is intact to light touch.  Negative Tinel's over the carpal and cubital  tunnel bilaterally.  He does  have some triggering of the index finger today.        An EMG study from Dr. Tana Coast was reviewed, which was negative for any carpal or cubital tunnel in the right upper extremity.        ASSESSMENT AND PLAN:   Jim Shaw is a 47 year-old male with bilateral upper extremity numbness and tingling.  He also reports some numbness and tingling in his right leg.  He has a known history of some cervical and lumbar arthritis.  I have deferred  treatment for that back to Dr. Jean Rosenthal and Dr. Carney Living for possible surgery.  I would like to get him in to see a neurologist to see if there is any other management of his numbness and tingling, as well as his headaches that he can have.                    Electronically signed by: Jim Like, MD

## 2017-12-29 NOTE — Progress Notes (Signed)
Patient: Jim Shaw                MRN: [id number]       SSN: [id number]  Date of Birth: [birth year]        AGE: 47 y.o.        SEX: male  Body mass index is 42.59 kg/m??.    PCP: Jim Decree, MD  12/29/17    Chief Complaint: Bilateral arm numbness/tingling    HISTORY OF PRESENT ILLNESS:  Jim Shaw returns to the office today for his right arm.  Since his last visit, he has started to have some numbness and tingling in his left arm.  He had an EMG done of the right upper extremity.  This was negative for any signs of carpal or cubital tunnel.  He is having bilateral hand numbness and tingling.  It comes on sporadically.  He says he has to shake his hands out to make it feel better.            Past Medical History:   Diagnosis Date   ??? Costochondritis    ??? Major depressive disorder 11/08/2017   ??? Psychiatric disorder        Family History   Problem Relation Age of Onset   ??? No Known Problems Mother    ??? No Known Problems Father        Current Outpatient Medications   Medication Sig Dispense Refill   ??? celecoxib (CELEBREX) 100 mg capsule Take 1 Cap by mouth daily.     ??? tiZANidine (ZANAFLEX) 4 mg tablet Take 1 Tab by mouth three (3) times daily. 90 Tab 1   ??? pregabalin (LYRICA) 75 mg capsule Take 1 Cap by mouth two (2) times a day. Max Daily Amount: 150 mg. 28 Cap 0   ??? pregabalin (LYRICA) 100 mg capsule Take 1 Cap by mouth three (3) times daily. Max Daily Amount: 300 mg. 90 Cap 1   ??? gabapentin (NEURONTIN) 600 mg tablet Take 600 mg by mouth three (3) times daily.     ??? hydrOXYzine HCl (ATARAX) 50 mg tablet Take 50 mg by mouth as needed for Anxiety.     ??? venlafaxine-SR (EFFEXOR-XR) 150 mg capsule Take 1 Cap by mouth daily (with breakfast). Indications: Anxiousness associated with Depression 15 Cap 1   ??? QUEtiapine (SEROQUEL) 50 mg tablet Take 1 Tab by mouth nightly. Indications: Additional Medications to Treat Depression (Patient taking  differently: Take 200 mg by mouth nightly. Indications: Additional Medications to Treat Depression) 1 Tab 0   ??? prazosin (MINIPRESS) 1 mg capsule TK 1 C PO QHS         Allergies   Allergen Reactions   ??? Oxycodone-Acetaminophen Anaphylaxis and Swelling   ??? Duloxetine Hcl Rash   ??? Aspirin Other (comments)   ??? Cortisone Nausea and Vomiting     Also rash    ??? Ibuprofen Rash       Past Surgical History:   Procedure Laterality Date   ??? HX ORTHOPAEDIC      Surgery on knee (right knee)        Social History     Socioeconomic History   ??? Marital status: MARRIED     Spouse name: Not on file   ??? Number of children: Not on file   ??? Years of education: Not on file   ??? Highest education level: Not on file   Occupational History   ??? Not on file   Social  Needs   ??? Financial resource strain: Not on file   ??? Food insecurity:     Worry: Not on file     Inability: Not on file   ??? Transportation needs:     Medical: Not on file     Non-medical: Not on file   Tobacco Use   ??? Smoking status: Former Smoker   ??? Smokeless tobacco: Never Used   Substance and Sexual Activity   ??? Alcohol use: Yes     Comment: rearly   ??? Drug use: Yes     Types: Marijuana   ??? Sexual activity: Not on file   Lifestyle   ??? Physical activity:     Days per week: Not on file     Minutes per session: Not on file   ??? Stress: Not on file   Relationships   ??? Social connections:     Talks on phone: Not on file     Gets together: Not on file     Attends religious service: Not on file     Active member of club or organization: Not on file     Attends meetings of clubs or organizations: Not on file     Relationship status: Not on file   ??? Intimate partner violence:     Fear of current or ex partner: Not on file     Emotionally abused: Not on file     Physically abused: Not on file     Forced sexual activity: Not on file   Other Topics Concern   ??? Military Service Not Asked   ??? Blood Transfusions Not Asked   ??? Caffeine Concern Not Asked   ??? Occupational Exposure Not Asked    ??? Hobby Hazards Not Asked   ??? Sleep Concern Not Asked   ??? Stress Concern Not Asked   ??? Weight Concern Not Asked   ??? Special Diet Not Asked   ??? Back Care Not Asked   ??? Exercise Not Asked   ??? Bike Helmet Not Asked   ??? Seat Belt Not Asked   ??? Self-Exams Not Asked   Social History Narrative   ??? Not on file       REVIEW OF SYSTEMS:      No changes from previous review of systems unless noted.    PHYSICAL EXAMINATION:  Visit Vitals  BP 124/72   Pulse 72   Temp 96.8 ??F (36 ??C) (Oral)   Resp 16   Ht 6' (1.829 m)   Wt 314 lb (142.4 kg)   BMI 42.59 kg/m??     Body mass index is 42.59 kg/m??.  GENERAL: Alert and oriented x3, in no acute distress.  HEENT: Normocephalic, atraumatic.    RESP: Non labored breathing.  SKIN: No rashes or lesions noted.   PHYSICAL EXAM:  Physical exam of both upper extremities with no focal neurological deficits.  His sensation is intact to light touch.  Negative Tinel's over the carpal and cubital tunnel bilaterally.  He does have some triggering of the index finger today.      An EMG study from Dr. Tana Shaw was reviewed, which was negative for any carpal or cubital tunnel in the right upper extremity.      ASSESSMENT AND PLAN:   Tyquan is a 47 year-old male with bilateral upper extremity numbness and tingling.  He also reports some numbness and tingling in his right leg.  He has a known history of some cervical and lumbar arthritis.  I have deferred treatment for that back to Dr. Jean Shaw and Dr. Carney Shaw for possible surgery.  I would like to get him in to see a neurologist to see if there is any other management of his numbness and tingling, as well as his headaches that he can have.              Electronically signed by: Jim Likeaniel T Kinneth Fujiwara, MD

## 2017-12-29 NOTE — Progress Notes (Signed)
1. Have you been to the ER, urgent care clinic since your last visit?  Hospitalized since your last visit?No    2. Have you seen or consulted any other health care providers outside of the [facility] since your last visit?  Include any pap smears or colon screening. No

## 2018-01-02 ENCOUNTER — Inpatient Hospital Stay: Admit: 2018-01-02 | Payer: MEDICAID | Primary: Family Medicine

## 2018-01-04 ENCOUNTER — Inpatient Hospital Stay: Admit: 2018-01-04 | Payer: MEDICAID | Primary: Family Medicine

## 2018-01-05 ENCOUNTER — Ambulatory Visit
Admit: 2018-01-05 | Payer: PRIVATE HEALTH INSURANCE | Attending: Student in an Organized Health Care Education/Training Program | Primary: Family Medicine

## 2018-01-05 ENCOUNTER — Ambulatory Visit: Attending: Student in an Organized Health Care Education/Training Program | Primary: Family Medicine

## 2018-01-05 DIAGNOSIS — R404 Transient alteration of awareness: Secondary | ICD-10-CM

## 2018-01-05 NOTE — Progress Notes (Signed)
 Chief Complaint   Patient presents with   . Numbness   . New Patient       Jim Shaw presents today for   Chief Complaint   Patient presents with   . Numbness   . New Patient       Is someone accompanying this pt? Yes, friend.    Is the patient using any DME equipment during OV? no    Depression Screening:  No flowsheet data found.    Learning Assessment:  No flowsheet data found.    Abuse Screening:  No flowsheet data found.    Fall Risk  No flowsheet data found.      Coordination of Care:  1. Have you been to the ER, urgent care clinic since your last visit? Hospitalized since your last visit? no    2. Have you seen or consulted any other health care providers outside of the [facility] since your last visit? Include any pap smears or colon screening. Yes, [facility].

## 2018-01-05 NOTE — Progress Notes (Signed)
Jim Shaw is a 47 y.o. male .presents for Numbness and New Patient   .    A 47 yo male patient with medical history of major depression, chronic low back pan came for evaluation of tingling and numbness over the extremities. It started from the right hand hand gradually involved he left hand. He feels as if he hands go to 'sleep' with tingling, intermittently; he tends to shake his hands. He has some difficulty holding objects. He has no difficulty lifting his arms up or moving at the elbow. He started has low back pain which is aching and started to have pain over the RLE(radiating from the low back). It gets worse when standing and walking. He has no involvement of the LLE. He has some tingling and numbness over the toes. He has mild RLE weakness. He has occasional falls because his RLE gives way. He has neck pain of 6-8 months duration; aching and radiates to the UEs. Neck feels stiff. He has been having headache for the past 6 months: frontal, occasionally occipital, thobbing, 7-8/10, with occasional nausea and vomiting; has photophobia and phonophobia. Lass for about 2-3 hours. No response to pain medications. He also has 'passing out episodes': when he tries to stand, feels blurred/blank, and blanks out for about 5 seconds; some stiffness but no tonic clonic movements. No post ictal confusion. Has episodes of urine incontinence.  He has MRI of the brain: didn't show acute lesions or mass effect; there are white matter ischemic changes, MRI of the LS spine showed mild degenerative changes at multiple levels but foraminal narrowing and compression of the exiting L5 nerve root at L5-S1 level; no significant spinal canal stenosis. MRI of the C-spine showed multilevel degenerative changes; C4-5 foraminal stenosis; moderate spinal canal narrowing.       Review of Systems   Constitutional: Positive for chills and malaise/fatigue. Negative for diaphoresis, fever and weight loss.   HENT: Negative for  hearing loss and tinnitus.    Eyes: Positive for blurred vision (snowy). Negative for double vision.   Respiratory: Positive for cough (during sleep) and shortness of breath. Negative for hemoptysis, sputum production and wheezing.    Cardiovascular: Positive for palpitations (with anxiety) and leg swelling (right ankle). Negative for chest pain.   Gastrointestinal: Positive for constipation, heartburn (occasional), nausea and vomiting (when upset). Negative for diarrhea.   Genitourinary: Positive for frequency. Negative for dysuria, hematuria and urgency.   Musculoskeletal: Positive for back pain, falls (3 times; right leg gives out), joint pain (right elbow) and neck pain. Negative for myalgias.   Skin: Positive for itching and rash.   Neurological: Positive for dizziness, tingling, tremors (sometimes), focal weakness (right leg), loss of consciousness (transent passing out episodes) and headaches. Negative for seizures.   Endo/Heme/Allergies: Does not bruise/bleed easily.   Psychiatric/Behavioral: Positive for depression (with past history of suicidal ideations; was admitted to hospital 3 times this year. ) and hallucinations (occasionally people talking about 'me'). Negative for memory loss and suicidal ideas. The patient has insomnia (going and maintaning sleep; from 3-7AM; sleep study coming January; snores).        Past Medical History:   Diagnosis Date   ??? Costochondritis    ??? Major depressive disorder 11/08/2017   ??? Psychiatric disorder        Past Surgical History:   Procedure Laterality Date   ??? HX ORTHOPAEDIC      Surgery on knee (right knee)  Family History   Problem Relation Age of Onset   ??? No Known Problems Mother    ??? No Known Problems Father         Social History     Socioeconomic History   ??? Marital status: MARRIED     Spouse name: Not on file   ??? Number of children: Not on file   ??? Years of education: Not on file   ??? Highest education level: Not on file   Occupational History   ??? Not on  file   Social Needs   ??? Financial resource strain: Not on file   ??? Food insecurity:     Worry: Not on file     Inability: Not on file   ??? Transportation needs:     Medical: Not on file     Non-medical: Not on file   Tobacco Use   ??? Smoking status: Current Every Day Smoker     Packs/day: 0.25     Years: 20.00     Pack years: 5.00   ??? Smokeless tobacco: Never Used   Substance and Sexual Activity   ??? Alcohol use: Yes     Comment: 5-6 shots/day   ??? Drug use: Yes     Types: Marijuana   ??? Sexual activity: Not on file   Lifestyle   ??? Physical activity:     Days per week: Not on file     Minutes per session: Not on file   ??? Stress: Not on file   Relationships   ??? Social connections:     Talks on phone: Not on file     Gets together: Not on file     Attends religious service: Not on file     Active member of club or organization: Not on file     Attends meetings of clubs or organizations: Not on file     Relationship status: Not on file   ??? Intimate partner violence:     Fear of current or ex partner: Not on file     Emotionally abused: Not on file     Physically abused: Not on file     Forced sexual activity: Not on file   Other Topics Concern   ??? Military Service Not Asked   ??? Blood Transfusions Not Asked   ??? Caffeine Concern Not Asked   ??? Occupational Exposure Not Asked   ??? Hobby Hazards Not Asked   ??? Sleep Concern Not Asked   ??? Stress Concern Not Asked   ??? Weight Concern Not Asked   ??? Special Diet Not Asked   ??? Back Care Not Asked   ??? Exercise Not Asked   ??? Bike Helmet Not Asked   ??? Seat Belt Not Asked   ??? Self-Exams Not Asked   Social History Narrative   ??? Not on file        Allergies   Allergen Reactions   ??? Oxycodone-Acetaminophen Anaphylaxis and Swelling   ??? Duloxetine Hcl Rash   ??? Aspirin Other (comments)   ??? Cortisone Nausea and Vomiting     Also rash    ??? Ibuprofen Rash         Current Outpatient Medications   Medication Sig Dispense Refill   ??? triamcinolone acetonide (KENALOG) 0.1 % ointment Apply  to affected  area two (2) times a day. use thin layer     ??? fluticasone propionate (FLONASE) 50 mcg/actuation nasal spray 2 Sprays by Both Nostrils route  daily.     ??? ciclopirox (PENLAC) 8 % solution Apply  to affected area nightly.     ??? tiZANidine (ZANAFLEX) 4 mg tablet Take 1 Tab by mouth three (3) times daily. 90 Tab 1   ??? pregabalin (LYRICA) 75 mg capsule Take 1 Cap by mouth two (2) times a day. Max Daily Amount: 150 mg. 28 Cap 0   ??? pregabalin (LYRICA) 100 mg capsule Take 1 Cap by mouth three (3) times daily. Max Daily Amount: 300 mg. 90 Cap 1   ??? hydrOXYzine HCl (ATARAX) 50 mg tablet Take 50 mg by mouth as needed for Anxiety.     ??? venlafaxine-SR (EFFEXOR-XR) 150 mg capsule Take 1 Cap by mouth daily (with breakfast). Indications: Anxiousness associated with Depression 15 Cap 1   ??? QUEtiapine (SEROQUEL) 50 mg tablet Take 1 Tab by mouth nightly. Indications: Additional Medications to Treat Depression (Patient taking differently: Take 200 mg by mouth nightly. Indications: Additional Medications to Treat Depression) 1 Tab 0   ??? prazosin (MINIPRESS) 1 mg capsule TK 1 C PO QHS     ??? celecoxib (CELEBREX) 100 mg capsule Take 1 Cap by mouth daily.     ??? gabapentin (NEURONTIN) 600 mg tablet Take 600 mg by mouth three (3) times daily.           Physical Exam  Vitals signs reviewed.   Constitutional:       Appearance: Normal appearance.   HENT:      Head: Normocephalic and atraumatic.      Mouth/Throat:      Mouth: Mucous membranes are moist.      Pharynx: Oropharynx is clear. No oropharyngeal exudate.   Eyes:      Extraocular Movements: Extraocular movements intact.   Neck:      Musculoskeletal: Normal range of motion and neck supple.   Cardiovascular:      Rate and Rhythm: Normal rate and regular rhythm.      Heart sounds: No murmur. No gallop.    Pulmonary:      Effort: No respiratory distress.      Breath sounds: Normal breath sounds. No wheezing or rales.   Musculoskeletal: Normal range of motion.         General: No swelling.       Comments: Tenderness over the lower back; positive SLR on the right side   Neurological:      Mental Status: He is alert.      Comments: Mental status: Awake, alert, oriented x3, follows simple, complex and inverted commands, no neglect, no extinction to DSS or VSS,.  Speech and languge: fluent, coherent,and comprehension intact  CN: VFF, EOMI, PERRLA, face sensation intact , no facial asymmetry noted, palate elevation symmetric bilat, SS+SCM 5/5 bilat, tongue midline  Motor: no pronator drift, tone normal throughout, strength 5/5 throughout except the RLE: poor effort.   Sensory: Reduced LT/PP sensation over the RUE and RLE; reduced vibration over the right foor=t; impaired position sense over the right big toe.  Coordination: FNF, HS accurate w/o dysmetria except unable to do it on the RLE.   DTR: 2+ throughout, toes downgoing BL  Gait: antalgic; negative Romberg.             Admission on 11/08/2017, Discharged on 11/09/2017   Component Date Value Ref Range Status   ??? WBC 11/08/2017 6.8  4.6 - 13.2 K/uL Final   ??? RBC 11/08/2017 4.91  4.70 - 5.50 M/uL Final   ??? HGB 11/08/2017  14.5  13.0 - 16.0 g/dL Final   ??? HCT 11/08/2017 43.1  36.0 - 48.0 % Final   ??? MCV 11/08/2017 87.8  74.0 - 97.0 FL Final   ??? MCH 11/08/2017 29.5  24.0 - 34.0 PG Final   ??? MCHC 11/08/2017 33.6  31.0 - 37.0 g/dL Final   ??? RDW 11/08/2017 15.6* 11.6 - 14.5 % Final   ??? PLATELET 11/08/2017 231  135 - 420 K/uL Final   ??? MPV 11/08/2017 9.3  9.2 - 11.8 FL Final   ??? NEUTROPHILS 11/08/2017 47  40 - 73 % Final   ??? LYMPHOCYTES 11/08/2017 44  21 - 52 % Final   ??? MONOCYTES 11/08/2017 6  3 - 10 % Final   ??? EOSINOPHILS 11/08/2017 2  0 - 5 % Final   ??? BASOPHILS 11/08/2017 1  0 - 2 % Final   ??? ABS. NEUTROPHILS 11/08/2017 3.3  1.8 - 8.0 K/UL Final   ??? ABS. LYMPHOCYTES 11/08/2017 3.0  0.9 - 3.6 K/UL Final   ??? ABS. MONOCYTES 11/08/2017 0.4  0.05 - 1.2 K/UL Final   ??? ABS. EOSINOPHILS 11/08/2017 0.1  0.0 - 0.4 K/UL Final   ??? ABS. BASOPHILS 11/08/2017 0.0  0.0 -  0.1 K/UL Final   ??? DF 11/08/2017 AUTOMATED    Final   ??? ALCOHOL(ETHYL),SERUM 11/08/2017 5* 0 - 3 MG/DL Final   ??? Sodium 11/08/2017 139  136 - 145 mmol/L Final   ??? Potassium 11/08/2017 3.9  3.5 - 5.5 mmol/L Final   ??? Chloride 11/08/2017 106  100 - 111 mmol/L Final   ??? CO2 11/08/2017 31  21 - 32 mmol/L Final   ??? Anion gap 11/08/2017 2* 3.0 - 18 mmol/L Final   ??? Glucose 11/08/2017 91  74 - 99 mg/dL Final   ??? BUN 11/08/2017 8  7.0 - 18 MG/DL Final   ??? Creatinine 11/08/2017 1.20  0.6 - 1.3 MG/DL Final   ??? BUN/Creatinine ratio 11/08/2017 7* 12 - 20   Final   ??? GFR est AA 11/08/2017 >60  >60 ml/min/1.9m Final   ??? GFR est non-AA 11/08/2017 >60  >60 ml/min/1.751mFinal    Comment: (NOTE)  Estimated GFR is calculated using the Modification of Diet in Renal   Disease (MDRD) Study equation, reported for both African Americans   (GFRAA) and non-African Americans (GFRNA), and normalized to 1.7361m body surface area. The physician must decide which value applies to   the patient. The MDRD study equation should only be used in   individuals age 30 4 older. It has not been validated for the   following: pregnant women, patients with serious comorbid conditions,   or on certain medications, or persons with extremes of body size,   muscle mass, or nutritional status.     ??? Calcium 11/08/2017 8.0* 8.5 - 10.1 MG/DL Final   ??? Bilirubin, total 11/08/2017 0.3  0.2 - 1.0 MG/DL Final   ??? ALT (SGPT) 11/08/2017 45  16 - 61 U/L Final   ??? AST (SGOT) 11/08/2017 29  10 - 38 U/L Final   ??? Alk. phosphatase 11/08/2017 61  45 - 117 U/L Final   ??? Protein, total 11/08/2017 7.1  6.4 - 8.2 g/dL Final   ??? Albumin 11/08/2017 3.4  3.4 - 5.0 g/dL Final   ??? Globulin 11/08/2017 3.7  2.0 - 4.0 g/dL Final   ??? A-G Ratio 11/08/2017 0.9  0.8 - 1.7   Final   ??? BENZODIAZEPINES 11/08/2017  NEGATIVE   NEG   Final   ??? BARBITURATES 11/08/2017 NEGATIVE   NEG   Final   ??? THC (TH-CANNABINOL) 11/08/2017 POSITIVE* NEG   Final   ??? OPIATES 11/08/2017 NEGATIVE   NEG   Final    ??? PCP(PHENCYCLIDINE) 11/08/2017 NEGATIVE   NEG   Final   ??? COCAINE 11/08/2017 NEGATIVE   NEG   Final   ??? AMPHETAMINES 11/08/2017 NEGATIVE   NEG   Final   ??? METHADONE 11/08/2017 NEGATIVE   NEG   Final   ??? HDSCOM 11/08/2017 (NOTE)   Final    Comment: Specimen analysis was performed without chain of custody handling.    These results should be used for medical purposes only and not for   legal or employment purposes.  Unconfirmed screening results must not   be used for non-medical purposes.    The cut-off concentration for positive results are as follows:    AMPH     1000 ng/mL  BARB      200 ng/mL  BENZ      200 ng/mL  COC       300 ng/mL  METH      300 ng/mL  OPI       300 ng/mL  PCP        25 ng/mL  THC        50 ng/mL                 ICD-10-CM ICD-9-CM    1. Transient alteration of awareness R40.4 780.02 EEG   2. Chronic radicular low back pain M54.16 724.4 EMG FOUR EXTREMITIES    G89.29 338.29    3. Bilateral carpal tunnel syndrome G56.03 354.0 EMG FOUR EXTREMITIES   4. Chronic tension-type headache, not intractable G44.229 339.12      A 47 yo male patient with the above medical problems came for evaluation of tingling and numbness over the extremities. Patient had chronic pain over the neck and back radiating to the upper and lower extremities. He has has RLE pain and difficulty walking. Most of the symptoms are radicular in both the UE and LEs. In addition,has some symptoms of bilateral CTS. His EMG was reported to be normal but it was only of the RUE. Patient has depression, sleeplessness; some of the symptoms are functional. Exam findings are not in a clear dermatomal distribution.  MRI reviewed above; images not available for review. He is on Pregabalin 142m PO TID; will continue with that. I have ordered EMG of the four extremities. His passing out episodes are possible syncopal episodes; less likely seizures; for the transient loss of awareness, will do EEG. Needs to continue follow up with spine  surgery. He has possible Tension type headache; on antidepressants and quetiapine. Will follow him in 3 months time.

## 2018-01-05 NOTE — Progress Notes (Signed)
Jim Shaw is a 47 y.o. male .presents for Numbness and New Patient   .    A 47 yo male patient with medical history of major depression, chronic low back pan came for evaluation of tingling and numbness over the extremities. It started from the right hand hand gradually involved he left hand. He feels as if he hands go to 'sleep' with tingling, intermittently; he tends to shake his hands. He has some difficulty holding objects. He has no difficulty lifting his arms up or moving at the elbow. He started has low back pain which is aching and started to have pain over the RLE(radiating from the low back). It gets worse when standing and walking. He has no involvement of the LLE. He has some tingling and numbness over the toes. He has mild RLE weakness. He has occasional falls because his RLE gives way. He has neck pain of 6-8 months duration; aching and radiates to the UEs. Neck feels stiff. He has been having headache for the past 6 months: frontal, occasionally occipital, thobbing, 7-8/10, with occasional nausea and vomiting; has photophobia and phonophobia. Lass for about 2-3 hours. No response to pain medications. He also has 'passing out episodes': when he tries to stand, feels blurred/blank, and blanks out for about 5 seconds; some stiffness but no tonic clonic movements. No post ictal confusion. Has episodes of urine incontinence.  He has MRI of the brain: didn't show acute lesions or mass effect; there are white matter ischemic changes, MRI of the LS spine showed mild degenerative changes at multiple levels but foraminal narrowing and compression of the exiting L5 nerve root at L5-S1 level; no significant spinal canal stenosis. MRI of the C-spine showed multilevel degenerative changes; C4-5 foraminal stenosis; moderate spinal canal narrowing.       Review of Systems   Constitutional: Positive for chills and malaise/fatigue. Negative for diaphoresis, fever and weight loss.    HENT: Negative for hearing loss and tinnitus.    Eyes: Positive for blurred vision (snowy). Negative for double vision.   Respiratory: Positive for cough (during sleep) and shortness of breath. Negative for hemoptysis, sputum production and wheezing.    Cardiovascular: Positive for palpitations (with anxiety) and leg swelling (right ankle). Negative for chest pain.   Gastrointestinal: Positive for constipation, heartburn (occasional), nausea and vomiting (when upset). Negative for diarrhea.   Genitourinary: Positive for frequency. Negative for dysuria, hematuria and urgency.   Musculoskeletal: Positive for back pain, falls (3 times; right leg gives out), joint pain (right elbow) and neck pain. Negative for myalgias.   Skin: Positive for itching and rash.   Neurological: Positive for dizziness, tingling, tremors (sometimes), focal weakness (right leg), loss of consciousness (transent passing out episodes) and headaches. Negative for seizures.   Endo/Heme/Allergies: Does not bruise/bleed easily.   Psychiatric/Behavioral: Positive for depression (with past history of suicidal ideations; was admitted to hospital 3 times this year. ) and hallucinations (occasionally people talking about 'me'). Negative for memory loss and suicidal ideas. The patient has insomnia (going and maintaning sleep; from 3-7AM; sleep study coming January; snores).        Past Medical History:   Diagnosis Date   ??? Costochondritis    ??? Major depressive disorder 11/08/2017   ??? Psychiatric disorder        Past Surgical History:   Procedure Laterality Date   ??? HX ORTHOPAEDIC      Surgery on knee (right knee)  Family History   Problem Relation Age of Onset   ??? No Known Problems Mother    ??? No Known Problems Father         Social History     Socioeconomic History   ??? Marital status: MARRIED     Spouse name: Not on file   ??? Number of children: Not on file   ??? Years of education: Not on file   ??? Highest education level: Not on file    Occupational History   ??? Not on file   Social Needs   ??? Financial resource strain: Not on file   ??? Food insecurity:     Worry: Not on file     Inability: Not on file   ??? Transportation needs:     Medical: Not on file     Non-medical: Not on file   Tobacco Use   ??? Smoking status: Current Every Day Smoker     Packs/day: 0.25     Years: 20.00     Pack years: 5.00   ??? Smokeless tobacco: Never Used   Substance and Sexual Activity   ??? Alcohol use: Yes     Comment: 5-6 shots/day   ??? Drug use: Yes     Types: Marijuana   ??? Sexual activity: Not on file   Lifestyle   ??? Physical activity:     Days per week: Not on file     Minutes per session: Not on file   ??? Stress: Not on file   Relationships   ??? Social connections:     Talks on phone: Not on file     Gets together: Not on file     Attends religious service: Not on file     Active member of club or organization: Not on file     Attends meetings of clubs or organizations: Not on file     Relationship status: Not on file   ??? Intimate partner violence:     Fear of current or ex partner: Not on file     Emotionally abused: Not on file     Physically abused: Not on file     Forced sexual activity: Not on file   Other Topics Concern   ??? Military Service Not Asked   ??? Blood Transfusions Not Asked   ??? Caffeine Concern Not Asked   ??? Occupational Exposure Not Asked   ??? Hobby Hazards Not Asked   ??? Sleep Concern Not Asked   ??? Stress Concern Not Asked   ??? Weight Concern Not Asked   ??? Special Diet Not Asked   ??? Back Care Not Asked   ??? Exercise Not Asked   ??? Bike Helmet Not Asked   ??? Seat Belt Not Asked   ??? Self-Exams Not Asked   Social History Narrative   ??? Not on file        Allergies   Allergen Reactions   ??? Oxycodone-Acetaminophen Anaphylaxis and Swelling   ??? Duloxetine Hcl Rash   ??? Aspirin Other (comments)   ??? Cortisone Nausea and Vomiting     Also rash    ??? Ibuprofen Rash         Current Outpatient Medications   Medication Sig Dispense Refill    ??? triamcinolone acetonide (KENALOG) 0.1 % ointment Apply  to affected area two (2) times a day. use thin layer     ??? fluticasone propionate (FLONASE) 50 mcg/actuation nasal spray 2 Sprays by Both Nostrils route  daily.     ??? ciclopirox (PENLAC) 8 % solution Apply  to affected area nightly.     ??? tiZANidine (ZANAFLEX) 4 mg tablet Take 1 Tab by mouth three (3) times daily. 90 Tab 1   ??? pregabalin (LYRICA) 75 mg capsule Take 1 Cap by mouth two (2) times a day. Max Daily Amount: 150 mg. 28 Cap 0   ??? pregabalin (LYRICA) 100 mg capsule Take 1 Cap by mouth three (3) times daily. Max Daily Amount: 300 mg. 90 Cap 1   ??? hydrOXYzine HCl (ATARAX) 50 mg tablet Take 50 mg by mouth as needed for Anxiety.     ??? venlafaxine-SR (EFFEXOR-XR) 150 mg capsule Take 1 Cap by mouth daily (with breakfast). Indications: Anxiousness associated with Depression 15 Cap 1   ??? QUEtiapine (SEROQUEL) 50 mg tablet Take 1 Tab by mouth nightly. Indications: Additional Medications to Treat Depression (Patient taking differently: Take 200 mg by mouth nightly. Indications: Additional Medications to Treat Depression) 1 Tab 0   ??? prazosin (MINIPRESS) 1 mg capsule TK 1 C PO QHS     ??? celecoxib (CELEBREX) 100 mg capsule Take 1 Cap by mouth daily.     ??? gabapentin (NEURONTIN) 600 mg tablet Take 600 mg by mouth three (3) times daily.           Physical Exam  Vitals signs reviewed.   Constitutional:       Appearance: Normal appearance.   HENT:      Head: Normocephalic and atraumatic.      Mouth/Throat:      Mouth: Mucous membranes are moist.      Pharynx: Oropharynx is clear. No oropharyngeal exudate.   Eyes:      Extraocular Movements: Extraocular movements intact.   Neck:      Musculoskeletal: Normal range of motion and neck supple.   Cardiovascular:      Rate and Rhythm: Normal rate and regular rhythm.      Heart sounds: No murmur. No gallop.    Pulmonary:      Effort: No respiratory distress.      Breath sounds: Normal breath sounds. No wheezing or rales.    Musculoskeletal: Normal range of motion.         General: No swelling.      Comments: Tenderness over the lower back; positive SLR on the right side   Neurological:      Mental Status: He is alert.      Comments: Mental status: Awake, alert, oriented x3, follows simple, complex and inverted commands, no neglect, no extinction to DSS or VSS,.  Speech and languge: fluent, coherent,and comprehension intact  CN: VFF, EOMI, PERRLA, face sensation intact , no facial asymmetry noted, palate elevation symmetric bilat, SS+SCM 5/5 bilat, tongue midline  Motor: no pronator drift, tone normal throughout, strength 5/5 throughout except the RLE: poor effort.   Sensory: Reduced LT/PP sensation over the RUE and RLE; reduced vibration over the right foor=t; impaired position sense over the right big toe.  Coordination: FNF, HS accurate w/o dysmetria except unable to do it on the RLE.   DTR: 2+ throughout, toes downgoing BL  Gait: antalgic; negative Romberg.             Admission on 11/08/2017, Discharged on 11/09/2017   Component Date Value Ref Range Status   ??? WBC 11/08/2017 6.8  4.6 - 13.2 K/uL Final   ??? RBC 11/08/2017 4.91  4.70 - 5.50 M/uL Final   ??? HGB 11/08/2017  14.5  13.0 - 16.0 g/dL Final   ??? HCT 11/08/2017 43.1  36.0 - 48.0 % Final   ??? MCV 11/08/2017 87.8  74.0 - 97.0 FL Final   ??? MCH 11/08/2017 29.5  24.0 - 34.0 PG Final   ??? MCHC 11/08/2017 33.6  31.0 - 37.0 g/dL Final   ??? RDW 11/08/2017 15.6* 11.6 - 14.5 % Final   ??? PLATELET 11/08/2017 231  135 - 420 K/uL Final   ??? MPV 11/08/2017 9.3  9.2 - 11.8 FL Final   ??? NEUTROPHILS 11/08/2017 47  40 - 73 % Final   ??? LYMPHOCYTES 11/08/2017 44  21 - 52 % Final   ??? MONOCYTES 11/08/2017 6  3 - 10 % Final   ??? EOSINOPHILS 11/08/2017 2  0 - 5 % Final   ??? BASOPHILS 11/08/2017 1  0 - 2 % Final   ??? ABS. NEUTROPHILS 11/08/2017 3.3  1.8 - 8.0 K/UL Final   ??? ABS. LYMPHOCYTES 11/08/2017 3.0  0.9 - 3.6 K/UL Final   ??? ABS. MONOCYTES 11/08/2017 0.4  0.05 - 1.2 K/UL Final    ??? ABS. EOSINOPHILS 11/08/2017 0.1  0.0 - 0.4 K/UL Final   ??? ABS. BASOPHILS 11/08/2017 0.0  0.0 - 0.1 K/UL Final   ??? DF 11/08/2017 AUTOMATED    Final   ??? ALCOHOL(ETHYL),SERUM 11/08/2017 5* 0 - 3 MG/DL Final   ??? Sodium 11/08/2017 139  136 - 145 mmol/L Final   ??? Potassium 11/08/2017 3.9  3.5 - 5.5 mmol/L Final   ??? Chloride 11/08/2017 106  100 - 111 mmol/L Final   ??? CO2 11/08/2017 31  21 - 32 mmol/L Final   ??? Anion gap 11/08/2017 2* 3.0 - 18 mmol/L Final   ??? Glucose 11/08/2017 91  74 - 99 mg/dL Final   ??? BUN 11/08/2017 8  7.0 - 18 MG/DL Final   ??? Creatinine 11/08/2017 1.20  0.6 - 1.3 MG/DL Final   ??? BUN/Creatinine ratio 11/08/2017 7* 12 - 20   Final   ??? GFR est AA 11/08/2017 >60  >60 ml/min/1.60m Final   ??? GFR est non-AA 11/08/2017 >60  >60 ml/min/1.763mFinal    Comment: (NOTE)  Estimated GFR is calculated using the Modification of Diet in Renal   Disease (MDRD) Study equation, reported for both African Americans   (GFRAA) and non-African Americans (GFRNA), and normalized to 1.7331m body surface area. The physician must decide which value applies to   the patient. The MDRD study equation should only be used in   individuals age 80 42 older. It has not been validated for the   following: pregnant women, patients with serious comorbid conditions,   or on certain medications, or persons with extremes of body size,   muscle mass, or nutritional status.     ??? Calcium 11/08/2017 8.0* 8.5 - 10.1 MG/DL Final   ??? Bilirubin, total 11/08/2017 0.3  0.2 - 1.0 MG/DL Final   ??? ALT (SGPT) 11/08/2017 45  16 - 61 U/L Final   ??? AST (SGOT) 11/08/2017 29  10 - 38 U/L Final   ??? Alk. phosphatase 11/08/2017 61  45 - 117 U/L Final   ??? Protein, total 11/08/2017 7.1  6.4 - 8.2 g/dL Final   ??? Albumin 11/08/2017 3.4  3.4 - 5.0 g/dL Final   ??? Globulin 11/08/2017 3.7  2.0 - 4.0 g/dL Final   ??? A-G Ratio 11/08/2017 0.9  0.8 - 1.7   Final   ??? BENZODIAZEPINES 11/08/2017  NEGATIVE   NEG   Final   ??? BARBITURATES 11/08/2017 NEGATIVE   NEG   Final    ??? THC (TH-CANNABINOL) 11/08/2017 POSITIVE* NEG   Final   ??? OPIATES 11/08/2017 NEGATIVE   NEG   Final   ??? PCP(PHENCYCLIDINE) 11/08/2017 NEGATIVE   NEG   Final   ??? COCAINE 11/08/2017 NEGATIVE   NEG   Final   ??? AMPHETAMINES 11/08/2017 NEGATIVE   NEG   Final   ??? METHADONE 11/08/2017 NEGATIVE   NEG   Final   ??? HDSCOM 11/08/2017 (NOTE)   Final    Comment: Specimen analysis was performed without chain of custody handling.    These results should be used for medical purposes only and not for   legal or employment purposes.  Unconfirmed screening results must not   be used for non-medical purposes.    The cut-off concentration for positive results are as follows:    AMPH     1000 ng/mL  BARB      200 ng/mL  BENZ      200 ng/mL  COC       300 ng/mL  METH      300 ng/mL  OPI       300 ng/mL  PCP        25 ng/mL  THC        50 ng/mL                 ICD-10-CM ICD-9-CM    1. Transient alteration of awareness R40.4 780.02 EEG   2. Chronic radicular low back pain M54.16 724.4 EMG FOUR EXTREMITIES    G89.29 338.29    3. Bilateral carpal tunnel syndrome G56.03 354.0 EMG FOUR EXTREMITIES   4. Chronic tension-type headache, not intractable G44.229 339.12      A 47 yo male patient with the above medical problems came for evaluation of tingling and numbness over the extremities. Patient had chronic pain over the neck and back radiating to the upper and lower extremities. He has has RLE pain and difficulty walking. Most of the symptoms are radicular in both the UE and LEs. In addition,has some symptoms of bilateral CTS. His EMG was reported to be normal but it was only of the RUE. Patient has depression, sleeplessness; some of the symptoms are functional. Exam findings are not in a clear dermatomal distribution.  MRI reviewed above; images not available for review. He is on Pregabalin 157m PO TID; will continue with that. I have ordered EMG of the four extremities. His passing out episodes are possible syncopal episodes; less  likely seizures; for the transient loss of awareness, will do EEG. Needs to continue follow up with spine surgery. He has possible Tension type headache; on antidepressants and quetiapine. Will follow him in 3 months time.

## 2018-01-05 NOTE — Progress Notes (Signed)
Chief Complaint   Patient presents with   ??? Numbness   ??? New Patient       Jim Shaw presents today for   Chief Complaint   Patient presents with   ??? Numbness   ??? New Patient       Is someone accompanying this pt? Yes, friend.    Is the patient using any DME equipment during OV? no    Depression Screening:  No flowsheet data found.    Learning Assessment:  No flowsheet data found.    Abuse Screening:  No flowsheet data found.    Fall Risk  No flowsheet data found.      Coordination of Care:  1. Have you been to the ER, urgent care clinic since your last visit? Hospitalized since your last visit? no    2. Have you seen or consulted any other health care providers outside of the [facility] since your last visit? Include any pap smears or colon screening. Yes, [facility].

## 2018-01-05 NOTE — Patient Instructions (Signed)
Thank you for choosing [facility] for your     care.  You may receive a survey about your visit.  We appreciate you taking time     to complete this survey as we use your feedback to improve our services.  We     realize we are not perfect, but we strive to provide excellent care.

## 2018-01-09 ENCOUNTER — Inpatient Hospital Stay: Admit: 2018-01-09 | Payer: MEDICAID | Primary: Family Medicine

## 2018-01-11 ENCOUNTER — Inpatient Hospital Stay: Admit: 2018-01-11 | Payer: MEDICAID | Primary: Family Medicine

## 2018-01-11 NOTE — Progress Notes (Signed)
 [facility] - [facility]  [address] [place], [place] [address]  [phone]  [phone] fax    Progress Note  Patient name: Jim Shaw Start of Care: 12/14/2017   Referral source: Leonce Zebedee MATSU, MD DOB: August 29, 1970   Medical/Treatment Diagnosis: Radiculopathy, lumbar region [M54.16]  Low back pain [M54.5]  Payor: [employer] / [place] / Product Type: Managed Care Medicaid /  Onset Date:May 2019     Prior Hospitalization: see medical history Provider#: [id number]   Medications: Verified on Patient Summary List    Comorbidities: PTSD, allergies, anxiety, arthritis, depression, headaches  Prior Level of Function:Functionally independent, lost his home when he lost his job (was working in Forensic scientist for various companies)and is currently staying with a friend, was working out    Visits from [employer] of Care: 7    Missed Visits: 0    Established Goals:          Excellent Good         Limited           None  [x]  Increased ROM   []   [x]   []   []   [x]  Increased Strength  []   [x]   []   []   [x]  Increased Mobility  []   []   [x]   []    [x]  Decreased Pain   []   []   [x]   []   []  Decreased Swelling  []   []   []   []     Short Term Goals:To be accomplished in 1weeks:  Goal: Patient will be independent and compliant with HEP in order to improve lumbar mobility.  Status at last note/certification: issued and reviewed  Status at discharge: met  Goal: Patient will initiate aquatic therapy without incident or increase in pain in order to progress toward long term goals.  Status at last note/certification: n/a  Status at discharge: met  Long Term Goals:To be accomplished in 10treatments:  Goal: Patient will improve FOTO assessment score to 45 pts in order to indicate improved functional abilities.  Status at last note/certification: 38 pts  Status at discharge: met, 47 pts  Goal: Patient will improve lumbar AROM to at least 75% of full all planes without increased pain  in order to improve ease of ADLs.  Status at last note/certification: flexion WNL with pain, extension 0-25% of full with pain, rotation/lateral flexion 50% with pain  Status at discharge: progressing, flexion WNL with pain, extension 25% of full with pain, lateral flexion Left WNL and Right 50% with pain, rotation 50% bilaterally with pain  Goal: Patient will improve Right LE strength to at least 4/5 throughout in order to be able to walk around a room without limitation.  Status at last note/certification: 3-/5 to 4-/5 throughout; limited a little  Status at discharge: progressing, grossly 4/5 or greater; remains limited a little  Goal: Patient will report average low back pain as 4/10 or less in order to improve quality of life.  Status at last note/certification: 8/10  Status at discharge: not met remains 8/10    Key Functional Changes:   Functional Gains: was feeling better but just in the past few days has had increased pain with household tasks; relief while in the pool  Functional Deficits: standing limited to 20 minutes, recent increase in lower back pain/pressure with standing/household tasks over the past few days  % improvement: limited overall  Pain   Average: 8/10       Best: 6/10  Worst: 10/10  Patient Goal: try to get it down some    Updated Goals: to be achieved in 10 treatments:  Goal: Patient will improve lumbar AROM to at least 75% of full all planes without increased pain in order to improve ease of ADLs.  Status at last note/certification: flexion WNL with pain, extension 25% of full with pain, lateral flexion Left WNL and Right 50% with pain, rotation 50% bilaterally with pain  Goal: Patient will report average low back pain as 4/10 or less in order to improve quality of life.  Status at last note/certification: 8/10  Goal: Patient will report standing tolerance of at least 30 minutes in order to improve ease of household tasks.  Status at last note/certifcation: 20 min  Goal: Patient  will report overall at least 50% improvement in function in order to prepare for independent management.  Status at last note/certification: limited    ASSESSMENT/RECOMMENDATIONS: Patient has been consistently attending aquatic therapy over the past month for low back pain. He was going to discharge to our post-rehab program after today's visit, but reports an increase in pain this week which significantly limits his ability to perform household chores. Additionally, he may be moving to [facility] at the end of the week, and would not be conveniently located to attend our post-rehab program. Recommend transition to land to improve ease of functional tasks and more thoroughly educate on body mechanics and HEP, and allow Patient to transfer to another location for land-based treatment if he does move.   [x] Continue therapy per initial plan/protocol at a frequency of  2 x per week for 5 weeks  [] Continue therapy with the following recommended changes:_____________________      _____________________________________________________________________  [] Discontinue therapy progressing towards or have reached established goals  [] Discontinue therapy due to lack of appreciable progress towards goals  [] Discontinue therapy due to lack of attendance or compliance  [] Await Physician's recommendations/decisions regarding therapy  [] Other:________________________________________________________________    Thank you for this referral.   Damien DELENA Caras, PT 01/11/2018 3:44 PM  NOTE TO PHYSICIAN:  PLEASE COMPLETE THE ORDERS BELOW AND   FAX TO [facility]: [phone]  If you are unable to process this request in 24 hours please contact our office: [phone]  []   I have read the above report and request that my patient continue as recommended.  []   I have read the above report and request that my patient continue therapy with the following changes/special instructions:________________________________________  [] I  have read the above report and request that my patient be discharged from therapy.    Physician's Signature:____________Date:_________TIME:________    [employer], Date and Time must be completed for valid certification **

## 2018-01-11 NOTE — Progress Notes (Signed)
 [facility]  [address] [place], [place] [address]  [phone]  [phone] fax    Discharge Summary    Patient name:Jim Shaw Start of Care:12/14/2017   Referral source:Jackson, Zebedee MATSU, MD DOB:February 25, 1970   Medical/Treatment Diagnosis:Radiculopathy, lumbar region [M54.16]  Low back pain [M54.5]  Payor: [employer] / [place] / Product Type: Managed Care Medicaid / Onset Date:May 2019     Prior Hospitalization: see medical history Provider#: [id number]   Medications: Verified on Patient Summary List    Comorbidities: PTSD, allergies, anxiety, arthritis, depression, headaches  Prior Level of Function:Functionally independent, lost his home when he lost his job (was working in Forensic scientist for various companies)and is currently staying with a friend, was working out    Visits from [employer] of Care: 7                                      Missed Visits: 0    Reporting Period : 12/14/17 to 01/11/18    Goal: Patient will improve lumbar AROM to at least 75% of full all planes without increased pain in order to improve ease of ADLs.  Status at last note/certification: flexion WNL with pain, extension 25% of full with pain, lateral flexion Left WNL and Right 50% with pain, rotation 50% bilaterally with pain  Status at discharge: unable to reassess due to no further follow ups  Goal: Patient will report average low back pain as 4/10 or less in order to improve quality of life.  Status at last note/certification: 8/10  Status at discharge: unable to reassess due to no further follow ups  Goal: Patient will report standing tolerance of at least 30 minutes in order to improve ease of household tasks.  Status at last note/certifcation: 20 min  Status at discharge: unable to reassess due to no further follow ups  Goal: Patient will report overall at least 50% improvement in function in order to prepare for independent management.  Status at  last note/certification: limited  Status at discharge: unable to reassess due to no further follow ups    Assessment/ Summary of Care: Patient attended aquatic therapy for low back pain, and expressed interest in transitioning to land-based treatment at his most recent session. Unfortunately Patient has not returned multiple calls to schedule sessions, and will be discharged at this time. Thank you for the referral of this Patient.    RECOMMENDATIONS:  [x] Discontinue therapy: [] Patient has reached or is progressing toward set goals      [x] Patient is non-compliant or has abdicated      [] Due to lack of appreciable progress towards set goals    Damien DELENA Caras, PT 02/05/2018 12:39 PM    NOTE TO PHYSICIAN:  Please complete the following and fax to:   [facility] at [facility] at [phone]  . Retain this original for your records.  If you are unable to process this request in   24 hours, please contact our office.     []  I have read the above report and request that my patient continue therapy with the following changes/special instructions:  []  I have read the above report and request that my patient be discharged from therapy    Physician's Signature:____________Date:_________TIME:________    ** Signature, Date and Time must be completed for valid certification **

## 2018-01-11 NOTE — Progress Notes (Signed)
[facility] ??? [facility]  [address]  [place], [place] [id number]  [phone]  [phone] fax    Discharge Summary    Patient name:??Jim Shaw Start of Care:??12/14/2017   Referral source:??Jackson, Grier Mittsheresa G, MD DOB:??12-02-1970   Medical/Treatment Diagnosis:??Radiculopathy, lumbar region [M54.16]  Low back pain [M54.5]  Payor: [employer] MEDICA[employer] / [place] / Product Type: Managed Care Medicaid /?? Onset Date:May 2019  ??   Prior Hospitalization: see medical history Provider#: [id number]   Medications: Verified on Patient Summary List ??   Comorbidities: PTSD, allergies, anxiety, arthritis, depression, headaches  ??Prior Level of Function:??Functionally independent, lost his home when he lost his job (was working in Forensic scientistinstallation for various companies)??and is currently staying with a friend, was working out  ??  Visits from Start of Care: 7                                      Missed Visits: 0    Reporting Period : 12/14/17 to 01/11/18    Goal: Patient will improve lumbar AROM to at least 75% of full all planes without increased pain in order to improve ease of ADLs.  Status at last note/certification: flexion WNL with pain, extension 25% of full with pain, lateral flexion Left WNL and Right 50% with pain, rotation 50% bilaterally with pain  Status at discharge: unable to reassess due to no further follow ups  Goal: Patient will report average low back pain as 4/10 or less in order to improve quality of life.  Status at last note/certification: 8/10  Status at discharge: unable to reassess due to no further follow ups  Goal: Patient will report standing tolerance of at least 30 minutes in order to improve ease of household tasks.  Status at last note/certifcation: 20 min  Status at discharge: unable to reassess due to no further follow ups  Goal: Patient will report overall at least 50% improvement in function in order to prepare for independent management.   Status at last note/certification: limited  Status at discharge: unable to reassess due to no further follow ups    Assessment/ Summary of Care: Patient attended aquatic therapy for low back pain, and expressed interest in transitioning to land-based treatment at his most recent session. Unfortunately Patient has not returned multiple calls to schedule sessions, and will be discharged at this time. Thank you for the referral of this Patient.    RECOMMENDATIONS:  [x] Discontinue therapy: [] Patient has reached or is progressing toward set goals      [x] Patient is non-compliant or has abdicated      [] Due to lack of appreciable progress towards set goals    Cato MulliganEmily A Rowley, PT 02/05/2018 12:39 PM    NOTE TO PHYSICIAN:  Please complete the following and fax to:   [facility] at [phone]  . Retain this original for your records.  If you are unable to process this request in   24 hours, please contact our office.     []  I have read the above report and request that my patient continue therapy with the following changes/special instructions:  []  I have read the above report and request that my patient be discharged from therapy    Physician's Signature:____________Date:_________TIME:________    ** Signature, Date and Time must be completed for valid certification **

## 2018-01-11 NOTE — Progress Notes (Signed)
[facility] ??? [place] [address]  [phone]  [phone] fax    Progress Note  Patient name: Jim Shaw Start of Care: 12/14/2017   Referral source: Kern Reap, MD DOB: August 23, 1970   Medical/Treatment Diagnosis: Radiculopathy, lumbar region [M54.16]  Low back pain [M54.5]  Payor: [employer] MEDICAID / [place] / Product Type: Managed Care Medicaid /  Onset Date:May 2019  ??   Prior Hospitalization: see medical history Provider#: [id number]   Medications: Verified on Patient Summary List ??   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[employer])??and is currently staying with a friend, was working out    Visits from [employer] of Care: 7    Missed Visits: 0    Established Goals:          Excellent Good         Limited           None  '[x]'  Increased ROM   '[]'   '[x]'   '[]'   '[]'   '[x]'  Increased Strength  '[]'   '[x]'   '[]'   '[]'   '[x]'  Increased Mobility  '[]'   '[]'   '[x]'   '[]'    '[x]'  Decreased Pain   '[]'   '[]'   '[x]'   '[]'   '[]'  Decreased Swelling  '[]'   '[]'   '[]'   '[]'     Short Term Goals:??To be accomplished in 1??weeks:  Goal: Patient will be independent and compliant with HEP in order to improve lumbar mobility.  Status at last note/certification: issued and reviewed  Status at discharge: met  Goal: Patient will initiate aquatic therapy without incident or increase in pain in order to progress toward long term goals.  Status at last note/certification: n/a  Status at discharge: met  Long Term Goals:??To be accomplished in 10??treatments:  Goal: Patient will improve FOTO assessment score to 45 pts in order to indicate improved functional abilities.  Status at last note/certification: 38 pts  Status at discharge: met, 47 pts  Goal: Patient will improve lumbar AROM to at least 75% of full all planes  without increased pain in order to improve ease of ADLs.  Status at last note/certification: flexion WNL with pain, extension 0-25% of full with pain, rotation/lateral flexion 50% with pain  Status at discharge: progressing, flexion WNL with pain, extension 25% of full with pain, lateral flexion Left WNL and Right 50% with pain, rotation 50% bilaterally with pain  Goal: Patient will improve Right LE strength to at least 4/5 throughout in order to be able to walk around a room without limitation.  Status at last note/certification: 3-/5 to 4-/5 throughout; limited a little  Status at discharge: progressing, grossly 4/5 or greater; remains limited a little  Goal: Patient will report average low back pain as 4/10 or less in order to improve quality of life.  Status at last note/certification: 2/95  Status at discharge: not met remains 8/10    Key Functional Changes:   Functional Gains: was feeling better but just in the past few days has had increased pain with household tasks; relief while in the pool  Functional Deficits: standing limited to 20 minutes, recent increase in lower back pain/pressure with standing/household tasks over the past few days  % improvement: limited overall  Pain   Average: 8/10       Best: 6/10  Worst: 10/10  Patient Goal: "try to get it down some"    Updated Goals: to be achieved in 10 treatments:  Goal: Patient will improve lumbar AROM to at least 75% of full all planes without increased pain in order to improve ease of ADLs.  Status at last note/certification: flexion WNL with pain, extension 25% of full with pain, lateral flexion Left WNL and Right 50% with pain, rotation 50% bilaterally with pain  Goal: Patient will report average low back pain as 4/10 or less in order to improve quality of life.  Status at last note/certification: 6/83  Goal: Patient will report standing tolerance of at least 30 minutes in order to improve ease of household tasks.   Status at last note/certifcation: 20 min  Goal: Patient will report overall at least 50% improvement in function in order to prepare for independent management.  Status at last note/certification: limited    ASSESSMENT/RECOMMENDATIONS: Patient has been consistently attending aquatic therapy over the past month for low back pain. He was going to discharge to our post-rehab program after today's visit, but reports an increase in pain this week which significantly limits his ability to perform household chores. Additionally, he may be moving to [facility] at the end of the week, and would not be conveniently located to attend our post-rehab program. Recommend transition to land to improve ease of functional tasks and more thoroughly educate on body mechanics and HEP, and allow Patient to transfer to another location for land-based treatment if he does move.   '[x]' Continue therapy per initial plan/protocol at a frequency of  2 x per week for 5 weeks  '[]' Continue therapy with the following recommended changes:_____________________      _____________________________________________________________________  '[]' Discontinue therapy progressing towards or have reached established goals  '[]' Discontinue therapy due to lack of appreciable progress towards goals  '[]' Discontinue therapy due to lack of attendance or compliance  '[]' Await Physician's recommendations/decisions regarding therapy  '[]' Other:________________________________________________________________    Thank you for this referral.   Bonnielee Haff, PT 01/11/2018 3:44 PM  NOTE TO PHYSICIAN:  PLEASE COMPLETE THE ORDERS BELOW AND   FAX TO [facility]: [phone]  If you are unable to process this request in 24 hours please contact our office: [phone]  '[]'   I have read the above report and request that my patient continue as recommended.  '[]'   I have read the above report and request that my patient continue therapy with the following changes/special  instructions:________________________________________  '[]' I have read the above report and request that my patient be discharged from therapy.    21 Signature:____________Date:_________TIME:________    [employer], Date and Time must be completed for valid certification **

## 2018-01-15 NOTE — Telephone Encounter (Signed)
Patient ran out of Lyrica and went back to his Gabapentin but neither one helped his pain.  He would like something else.    [facility] [address] [phone]    His# [phone]

## 2018-01-15 NOTE — Telephone Encounter (Signed)
Looks like he was on the Lyrica for a short time? He needs to be on it for a few months in order for it to work. See how long and how much he took

## 2018-01-15 NOTE — Telephone Encounter (Signed)
Returned call to patient, verified Name/DOB, informed patient of below message.    Patient verbalized agreement/understanding.    No further action required at this time.

## 2018-01-15 NOTE — Telephone Encounter (Signed)
I would recommend OTC Tylenol or NSAIDS for pain. We can do an rx for mobic if he would like a prescription NSAID.

## 2018-01-15 NOTE — Telephone Encounter (Signed)
Patient notified of PA Winkler's message. No further action needed at this time.

## 2018-01-15 NOTE — Telephone Encounter (Signed)
Patient is requesting something for hand pain.      [facility] [address] [phone]    His# [phone]

## 2018-01-16 ENCOUNTER — Encounter: Attending: Family Medicine | Primary: Family Medicine

## 2018-01-29 ENCOUNTER — Inpatient Hospital Stay
Admit: 2018-01-29 | Payer: MEDICAID | Attending: Student in an Organized Health Care Education/Training Program | Primary: Family Medicine

## 2018-01-29 DIAGNOSIS — R404 Transient alteration of awareness: Secondary | ICD-10-CM

## 2018-01-29 NOTE — Progress Notes (Signed)
Called patient for EEG results patients understands but would like a follow up appointment transferred him to front desk to make appointment

## 2018-01-29 NOTE — Progress Notes (Signed)
EEG completed 01/29/2018.

## 2018-01-29 NOTE — Procedures (Signed)
[facility]  EEG    Name:  Jim Shaw, Jim Shaw  MR#:   [id number]  DOB:  06-21-1970  ACCOUNT #:  [id number]  DATE OF SERVICE:  01/29/2018    REFERRING PROVIDER:  Vernie Murders, MD    EEG NUMBER:  [place] [id number]    HISTORY:  A 48 year old male with episodes of transient alterations of consciousness.    MEDICATIONS:  Gabapentin, Celebrex, Minipress, Seroquel, Effexor, Atarax, Lyrica, Zanaflex.    EEG REPORT:  This is a 16-channel routine EEG done using the international 10-20 electrode placement system.  The predominant background consists of 8 Hz posterior predominant and symmetric activity which attenuates with eye opening.  Secondary rhythms consists of 5-6 Hz lower amplitude diffuse symmetric activity consistent with drowsiness which transitions into brief periods of vertex waves, poorly defined K-complexes and sleep spindles consistent with brief stage II sleep.  No abnormal photoparoxysmal responses were seen.  No abnormalities were seen with hyperventilation.  No epileptiform abnormalities were observed.    IMPRESSION:  This is a normal awake, drowsy, and asleep EEG.      [name] Lynn, MD      [id number]  D:  01/29/2018 15:37  T:  01/29/2018 21:24  JOB #:  [id number]

## 2018-01-29 NOTE — Procedures (Signed)
[facility]  EEG    Name:  Jim Shaw, Jim Shaw  MR#:   [id number]  DOB:  10/22/1970  ACCOUNT #:  [id number]  DATE OF SERVICE:  01/29/2018    REFERRING PROVIDER:  Belachew D Arasho, MD    EEG NUMBER:  [place] 20-10    HISTORY:  A 47-year-old male with episodes of transient alterations of consciousness.    MEDICATIONS:  Gabapentin, Celebrex, Minipress, Seroquel, Effexor, Atarax, Lyrica, Zanaflex.    EEG REPORT:  This is a 16-channel routine EEG done using the international 10-20 electrode placement system.  The predominant background consists of 8 Hz posterior predominant and symmetric activity which attenuates with eye opening.  Secondary rhythms consists of 5-6 Hz lower amplitude diffuse symmetric activity consistent with drowsiness which transitions into brief periods of vertex waves, poorly defined K-complexes and sleep spindles consistent with brief stage II sleep.  No abnormal photoparoxysmal responses were seen.  No abnormalities were seen with hyperventilation.  No epileptiform abnormalities were observed.    IMPRESSION:  This is a normal awake, drowsy, and asleep EEG.      Anatalia Kronk E Axavier Pressley, MD      [id number]  D:  01/29/2018 15:37  T:  01/29/2018 21:24  JOB #:  [id number]

## 2018-01-29 NOTE — Progress Notes (Signed)
Called patient for EEG results patients understands but would like a follow up appointment transferred him to front desk to make appointment

## 2018-02-02 ENCOUNTER — Ambulatory Visit
Admit: 2018-02-02 | Payer: PRIVATE HEALTH INSURANCE | Attending: Physical Medicine & Rehabilitation | Primary: Family Medicine

## 2018-02-02 ENCOUNTER — Encounter: Attending: Physical Medicine & Rehabilitation | Primary: Family Medicine

## 2018-02-02 NOTE — Progress Notes (Signed)
This encounter was created in error - please disregard.

## 2018-02-06 ENCOUNTER — Encounter: Attending: Family Medicine | Primary: Family Medicine

## 2018-03-06 ENCOUNTER — Ambulatory Visit: Admit: 2018-03-06 | Discharge: 2018-03-06 | Payer: PRIVATE HEALTH INSURANCE | Primary: Family Medicine

## 2018-03-06 ENCOUNTER — Ambulatory Visit: Primary: Family Medicine

## 2018-03-06 DIAGNOSIS — G5603 Carpal tunnel syndrome, bilateral upper limbs: Secondary | ICD-10-CM

## 2018-03-06 NOTE — Progress Notes (Signed)
Progress  Notes by [name] Lynn, MD at 03/06/18 1300                Author: [name] Lynn, MD  Service: --  Author Type: Physician       Filed: 03/06/18 1349  Encounter Date: 03/06/2018  Status: Signed          Editor: [name] Lynn, MD (Physician)               [facility]   [address] [place] [address]   [place] [address]   [phone]      Valinda Hoar [phone]      Neurophysiology Report           Patient:  Jim Shaw              ID:  [id number]  Physician:  Peter Minium, MD     Gender:  Male  Ref Phys:  Danton Clap, MD     Handedness:  Arvilla Meres              Study Date:  March 06, 2018               Patient History:  Right leg numbness, right worse than left leg numbness      Nerve Conduction Studies   Anti Sensory Summary Table                Stim Site  NR  Peak (ms)  Norm Peak (ms)  O-P Amp (??V)  Norm O-P Amp  Dist (cm)  Vel (m/s)       Left Median 2nd Digit Anti Sensory (2nd Digit)              Wrist      3.2  <3.5  12.5  >20  13.0  50.0       Right Median 2nd Digit Anti Sensory (2nd Digit)              Wrist      3.6  <3.5  12.4  >20  13.0  52.0       Left Sup Peron Anti Sensory (Lower Leg)              Ankle      2.7  <4.4  11.0  >6.0  14.0  77.8       Right Sup Peron Anti Sensory (Lower Leg)              Ankle      2.9  <4.4  12.5  >6.0  14.0  77.8       Left Sural Anti Sensory (Ankle)              Calf      3.5  <4.4  6.8  >6.0  14.0  51.9       Right Sural Anti Sensory (Ankle)              Calf      3.2  <4.4  10.5  >6.0  14.0  77.8       Left Ulnar Anti Sensory (5th Digit )              Wrist      3.1  <3.1  8.6  >17.0  11.0  45.8       Right Ulnar Anti Sensory (5th Digit )              Wrist  2.9  <3.1  6.3  >17.0  11.0  47.8        Motor Summary Table                 Stim Site  NR  Onset (ms)  Norm Onset (ms)  O-P Amp (mV)  Norm O-P Amp  Dist (cm)  Vel (m/s)  Norm Vel (m/s)       Left Median Motor (Abd Poll Brev)               Wrist      3.4  <4.4   5.7  >4.0  25.0  58.1  >49     Elbow      7.7    2.3    0.0    >48     Axilla      8.6    2.8               Right Median Motor (Abd Poll Brev)               Wrist      3.2  <4.4  5.9  >4.0  24.0  52.2  >49     Elbow      7.8    4.9               Left Peroneal Motor (EDB)               Ankle      3.4  <6.5  2.3  >2.0  38.0  46.3  >44     Fibula (Head)      11.6    4.3    10.0  166.7       Pop Fossa      12.2    3.8               Right Peroneal Motor (EDB)               Ankle      3.6  <6.5  3.8  >2.0  38.0  50.0  >44     Fibula (Head)      11.2    4.7    10.0  90.9       Pop Fossa      12.3    3.2               Left Tibial Motor (Abd Hall Brev)               Ankle      5.2  <5.8  5.7  >4.0             Right Tibial Motor (Abd Hall Brev)               Ankle      4.2  <5.8  6.0  >4.0             Left Ulnar Motor (Abd Dig Minimi )               Wrist      3.0  <3.3  7.9  >6.0  23.0  65.7  >49     B Elbow      6.5    7.6    12.0  60.0  >50     A Elbow      8.5    7.2               Right Ulnar  Motor (Abd Dig Minimi )               Wrist      2.7  <3.3  6.2  >6.0  20.0  55.6  >49     B Elbow      6.3    6.4    13.0  50.0  >50               A Elbow      8.9    6.2                   EMG                      Side  Muscle  Nerve  Root  Ins Act  Fibs  Psw  Fasc  Amp  Dur  Poly  Recrt  Int Dennie BiblePat  Comment                    Right  Deltoid  Axillary  C5-6  Nml  Nml  Nml  None  Nml  Nml  0  Nml  Nml       Right  Biceps  Musculocut  C5-6  Nml  Nml  Nml  None  Nml  Nml  0  Nml  Nml       Right  Triceps  Radial  C6-7-8  Nml  Nml  Nml  None  Nml  Nml  0  Nml  Nml       Right  PronatorTeres  Median  C6-7  Nml  Nml  Nml  None  Nml  Nml  0  Nml  Nml       Right  FlexCarpiUln  Ulnar  C8,T1  Nml  Nml  Nml  None  Nml  Nml  0  Nml  Nml       Right  Abd Poll Brev  Median  C8-T1  Nml  Nml  Nml  None  Nml  Nml  0  Nml  Nml       Right  AntTibialis  Dp Br Fibular  L4-5  Nml  Nml  Nml  None  Nml  Nml  0  Nml  Nml       Right  VastusLat  Femoral  L2-4   Nml  Nml  Nml  None  Nml  Nml  0  Nml  Nml       Right  BicepsFemS  Sciatic  L5-S1  Nml  Nml  Nml  None  Nml  Nml  0  Nml  Nml       Right  Ext Dig Brev  Dp Br Fibular  L5, S1  Nml  Nml  Nml  None  Nml  Nml  0  Nml  Nml                      Right  AbdHallucis  MedPlantar  S1-2  Nml  Nml  Nml  None  Nml  Nml  0  Nml  Nml          NCS/EMG FINDINGS:   ??  Evaluation of the Left median motor, the Right median motor, the Left peroneal motor, the Right peroneal motor,  the Left tibial motor, the Right tibial motor, the Left ulnar motor, the Right ulnar motor, the Left superficial peroneal sensory, the Right superficial peroneal sensory, the Left sural sensory,  and the Right sural sensory nerves were unremarkable.   ??  The Left Median 2nd Digit sensory nerve showed reduced amplitude (12.5 ??V).   ??  The Right Median 2nd Digit sensory nerve showed prolonged distal peak latency (3.6 ms) and reduced amplitude (12.4 ??V).   ??  The Left ulnar sensory and the Right ulnar sensory nerves showed reduced amplitude (L8.6, R6.3 ??V) and decreased conduction velocity (Wrist-5th Digit, L45.8, R47.8 m/s).      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    3-Normal EMG of RLE muscles.       ___________________________   Peter Minium, MD   Board Certified in Neurology                                                                                    [facility]   OFFICE PROCEDURE PROGRESS NOTE            Chart reviewed for the following:    I, [name] Lynn, MD, have reviewed the History, Physical and updated the Allergic reactions for [facility]       TIME OUT performed immediately prior to start of procedure:    I, [name] Lynn, MD, have performed the following reviews on [facility] prior to the start of the procedure:              * Patient was  identified by name and date of birth    * Agreement on procedure being performed was verified   * Risks and Benefits explained to the patient   * Procedure site verified and marked as necessary   * Patient was positioned for comfort   * Consent was signed and verified       Time: 1:47 PM      Date of procedure: 03/06/2018      Procedure performed by:  PROCEDURE ROOM      Provider assisted by: Orvan Falconer       Patient assisted by: None      How tolerated by patient: tolerated the procedure well with no complications      Post Procedural Pain Scale: 0 - No Hurt      Comments: none

## 2018-03-06 NOTE — Progress Notes (Signed)
 Jim Shaw presents today for   Chief Complaint   Patient presents with   . Procedure     EMG BUE/BLE       Is someone accompanying this pt? No    Is the patient using any DME equipment during OV? Yes, a cane.     Are there any discrepancies in patient's vital signs?  No

## 2018-03-06 NOTE — Progress Notes (Signed)
[facility]  [address]  [place] [id number]  [phone]      Valinda HoarFAX [phone]    Neurophysiology Report    Patient: Jim ContesChristopher Sales     ID: [id number] Physician: Peter MiniumJuan Arshad Oberholzer, MD   Gender: Male Ref Phys: Danton ClapBelachew Arasho, MD   Handedness: Arvilla MeresBrittany Campbell     Study Date: March 06, 2018         Patient History:  Right leg numbness, right worse than left leg numbness    Nerve Conduction Studies  Anti Sensory Summary Table     Stim Site NR Peak (ms) Norm Peak (ms) O-P Amp (??V) Norm O-P Amp Dist (cm) Vel (m/s)   Left Median 2nd Digit Anti Sensory (2nd Digit)   Wrist    3.2 <3.5 12.5 >20 13.0 50.0   Right Median 2nd Digit Anti Sensory (2nd Digit)   Wrist    3.6 <3.5 12.4 >20 13.0 52.0   Left Sup Peron Anti Sensory (Lower Leg)   Ankle    2.7 <4.4 11.0 >6.0 14.0 77.8   Right Sup Peron Anti Sensory (Lower Leg)   Ankle    2.9 <4.4 12.5 >6.0 14.0 77.8   Left Sural Anti Sensory (Ankle)   Calf    3.5 <4.4 6.8 >6.0 14.0 51.9   Right Sural Anti Sensory (Ankle)   Calf    3.2 <4.4 10.5 >6.0 14.0 77.8   Left Ulnar Anti Sensory (5th Digit )   Wrist    3.1 <3.1 8.6 >17.0 11.0 45.8   Right Ulnar Anti Sensory (5th Digit )   Wrist    2.9 <3.1 6.3 >17.0 11.0 47.8     Motor Summary Table     Stim Site NR Onset (ms) Norm Onset (ms) O-P Amp (mV) Norm O-P Amp Dist (cm) Vel (m/s) Norm Vel (m/s)   Left Median Motor (Abd Poll Brev)   Wrist    3.4 <4.4 5.7 >4.0 25.0 58.1 >49   Elbow    7.7  2.3  0.0  >48   Axilla    8.6  2.8       Right Median Motor (Abd Poll Brev)   Wrist    3.2 <4.4 5.9 >4.0 24.0 52.2 >49   Elbow    7.8  4.9       Left Peroneal Motor (EDB)   Ankle    3.4 <6.5 2.3 >2.0 38.0 46.3 >44   Fibula (Head)    11.6  4.3  10.0 166.7    Pop Fossa    12.2  3.8       Right Peroneal Motor (EDB)   Ankle    3.6 <6.5 3.8 >2.0 38.0 50.0 >44   Fibula (Head)    11.2  4.7  10.0 90.9    Pop Fossa    12.3  3.2       Left Tibial Motor (Abd Hall Brev)   Ankle    5.2 <5.8 5.7 >4.0       Right Tibial Motor (Abd Hall Brev)   Ankle    4.2 <5.8 6.0 >4.0      Left Ulnar Motor (Abd Dig Minimi )   Wrist    3.0 <3.3 7.9 >6.0 23.0 65.7 >49   B Elbow    6.5  7.6  12.0 60.0 >50   A Elbow    8.5  7.2       Right Ulnar Motor (Abd Dig Minimi )   Wrist  2.7 <3.3 6.2 >6.0 20.0 55.6 >49   B Elbow    6.3  6.4  13.0 50.0 >50   A Elbow    8.9  6.2           EMG     Side Muscle Nerve Root Ins Act Fibs Psw Fasc Amp Dur Poly Recrt Int Dennie Bible Comment   Right Deltoid Axillary C5-6 Nml Nml Nml None Nml Nml 0 Nml Nml    Right Biceps Musculocut C5-6 Nml Nml Nml None Nml Nml 0 Nml Nml    Right Triceps Radial C6-7-8 Nml Nml Nml None Nml Nml 0 Nml Nml    Right PronatorTeres Median C6-7 Nml Nml Nml None Nml Nml 0 Nml Nml    Right FlexCarpiUln Ulnar C8,T1 Nml Nml Nml None Nml Nml 0 Nml Nml    Right Abd Poll Brev Median C8-T1 Nml Nml Nml None Nml Nml 0 Nml Nml    Right AntTibialis Dp Br Fibular L4-5 Nml Nml Nml None Nml Nml 0 Nml Nml    Right VastusLat Femoral L2-4 Nml Nml Nml None Nml Nml 0 Nml Nml    Right BicepsFemS Sciatic L5-S1 Nml Nml Nml None Nml Nml 0 Nml Nml    Right Ext Dig Brev Dp Br Fibular L5, S1 Nml Nml Nml None Nml Nml 0 Nml Nml    Right AbdHallucis MedPlantar S1-2 Nml Nml Nml None Nml Nml 0 Nml Nml      NCS/EMG FINDINGS:  ? Evaluation of the Left median motor, the Right median motor, the Left peroneal motor, the Right peroneal motor, the Left tibial motor, the Right tibial motor, the Left ulnar motor, the Right ulnar motor, the Left superficial peroneal sensory, the Right superficial peroneal sensory, the Left sural sensory, and the Right sural sensory nerves were unremarkable.  ? The Left Median 2nd Digit sensory nerve showed reduced amplitude (12.5 ??V).  ? The Right Median 2nd Digit sensory nerve showed prolonged distal peak latency (3.6 ms) and reduced amplitude (12.4 ??V).  ? The Left ulnar sensory and the Right ulnar sensory nerves showed reduced  amplitude (L8.6, R6.3 ??V) and decreased conduction velocity (Wrist-5th Digit, L45.8, R47.8 m/s).    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   3-Normal EMG of RLE muscles.     ___________________________  Peter Minium, MD  Board Certified in Neurology                                        [facility]  OFFICE PROCEDURE PROGRESS NOTE        Chart reviewed for the following:   I, [name] Lynn, MD, have reviewed the History, Physical and updated the Allergic reactions for [facility]     TIME OUT performed immediately prior to start of procedure:   I, [name] Lynn, MD, have performed the following reviews on [facility] prior to the start of the procedure:            * Patient was identified by name and date of birth   * Agreement on procedure being performed was verified  * Risks and Benefits explained to the patient  * Procedure site verified and marked as necessary  * Patient was positioned for comfort  *  Consent was signed and verified     Time: 1:47 PM    Date of procedure: 03/06/2018    Procedure performed by:  PROCEDURE ROOM    Provider assisted by: Orvan Falconer     Patient assisted by: None    How tolerated by patient: tolerated the procedure well with no complications    Post Procedural Pain Scale: 0 - No Hurt    Comments: none

## 2018-03-06 NOTE — Progress Notes (Signed)
Jim Shaw presents today for   Chief Complaint   Patient presents with   ??? Procedure     EMG BUE/BLE       Is someone accompanying this pt? No    Is the patient using any DME equipment during OV? Yes, a cane.     Are there any discrepancies in patient's vital signs?  No

## 2018-03-26 ENCOUNTER — Ambulatory Visit
Admit: 2018-03-26 | Discharge: 2018-03-26 | Payer: PRIVATE HEALTH INSURANCE | Attending: Student in an Organized Health Care Education/Training Program | Primary: Family Medicine

## 2018-03-26 ENCOUNTER — Ambulatory Visit: Attending: Student in an Organized Health Care Education/Training Program | Primary: Family Medicine

## 2018-03-26 DIAGNOSIS — G5601 Carpal tunnel syndrome, right upper limb: Secondary | ICD-10-CM

## 2018-03-26 NOTE — Progress Notes (Signed)
Jim Shaw is a 48 y.o. male .presents for Carpal Tunnel and Numbness (leg)   .    A 48 yo male patient with medical history of major depression, chronic low back pan came for follow up of of tingling and numbness over the extremities. He was last seen in the clinic on Dec 14/2020. EMG of the UE and LEs was ordered. In addition, for syncopal episodes, EEG was ordered. The EMG of the UE and LEs only showed mild right CTS. He continued to have the same sympoms. He has numbness over his hands and feet, toes feel cold. He continued to have the neck and lower back pain radiating to the extremities.     From last visit:   It started from the right hand hand gradually involved he left hand. He feels as if hs hands go to 'sleep' with tingling, intermittently; he tends to shake his hands. He has some difficulty holding objects. He has no difficulty lifting his arms up or moving at the elbow. He started has low back pain which is aching and started to have pain over the RLE(radiating from the low back). It gets worse when standing and walking. He has no involvement of the LLE. He has some tingling and numbness over the toes. He has mild RLE weakness. He has occasional falls because his RLE gives way. He has neck pain of 6-8 months duration; aching and radiates to the UEs. Neck feels stiff. He has been having headache for the past 6 months: frontal, occasionally occipital, thobbing, 7-8/10, with occasional nausea and vomiting; has photophobia and phonophobia. Lass for about 2-3 hours. No response to pain medications. He also has 'passing out episodes': when he tries to stand, feels blurred/blank, and blanks out for about 5 seconds; some stiffness but no tonic clonic movements. No post ictal confusion. Has episodes of urine incontinence.  He has MRI of the brain: didn't show acute lesions or mass effect; there are white matter ischemic changes, MRI of the LS spine showed mild degenerative changes at multiple  levels but foraminal narrowing and compression of the exiting L5 nerve root at L5-S1 level; no significant spinal canal stenosis. MRI of the C-spine showed multilevel degenerative changes; C4-5 foraminal stenosis; moderate spinal canal narrowing.     Carpal Tunnel   Associated symptoms include headaches (worse recently) and shortness of breath. Pertinent negatives include no chest pain.   Numbness   Primary symptoms include focal weakness (right leg).Pertinent negatives include no memory loss. Associated symptoms include shortness of breath, headaches (worse recently) and nausea. Pertinent negatives include no chest pain and no vomiting (when upset).       Review of Systems   Constitutional: Positive for chills and malaise/fatigue. Negative for diaphoresis, fever and weight loss.   HENT: Negative for hearing loss and tinnitus.    Eyes: Positive for blurred vision (snowy). Negative for double vision.   Respiratory: Positive for cough (during sleep) and shortness of breath. Negative for hemoptysis, sputum production and wheezing.    Cardiovascular: Positive for palpitations (with anxiety) and leg swelling (right ankle). Negative for chest pain.   Gastrointestinal: Positive for constipation, heartburn (occasional) and nausea. Negative for diarrhea and vomiting (when upset).   Genitourinary: Positive for frequency. Negative for dysuria, hematuria and urgency.   Musculoskeletal: Positive for back pain, falls (3 times; right leg gives out), joint pain (right elbow) and neck pain. Negative for myalgias.   Skin: Positive for itching and rash.   Neurological: Positive for  dizziness, tingling, tremors (sometimes), focal weakness (right leg), loss of consciousness (transent passing out episodes), numbness and headaches (worse recently). Negative for seizures.   Endo/Heme/Allergies: Does not bruise/bleed easily.   Psychiatric/Behavioral: Positive for depression (with past history of suicidal ideations; was admitted to hospital  3 times this year. ) and hallucinations (occasionally people talking about 'me'). Negative for memory loss and suicidal ideas. The patient has insomnia (going and maintaning sleep; from 3-7AM; sleep study coming January; snores).        Past Medical History:   Diagnosis Date   ??? Costochondritis    ??? Major depressive disorder 11/08/2017   ??? Psychiatric disorder        Past Surgical History:   Procedure Laterality Date   ??? HX ORTHOPAEDIC      Surgery on knee (right knee)         Family History   Problem Relation Age of Onset   ??? No Known Problems Mother    ??? No Known Problems Father         Social History     Socioeconomic History   ??? Marital status: MARRIED     Spouse name: Not on file   ??? Number of children: Not on file   ??? Years of education: Not on file   ??? Highest education level: Not on file   Occupational History   ??? Not on file   Social Needs   ??? Financial resource strain: Not on file   ??? Food insecurity:     Worry: Not on file     Inability: Not on file   ??? Transportation needs:     Medical: Not on file     Non-medical: Not on file   Tobacco Use   ??? Smoking status: Current Every Day Smoker     Packs/day: 0.25     Years: 20.00     Pack years: 5.00   ??? Smokeless tobacco: Never Used   Substance and Sexual Activity   ??? Alcohol use: Yes     Comment: 5-6 shots/day   ??? Drug use: Yes     Types: Marijuana   ??? Sexual activity: Not on file   Lifestyle   ??? Physical activity:     Days per week: Not on file     Minutes per session: Not on file   ??? Stress: Not on file   Relationships   ??? Social connections:     Talks on phone: Not on file     Gets together: Not on file     Attends religious service: Not on file     Active member of club or organization: Not on file     Attends meetings of clubs or organizations: Not on file     Relationship status: Not on file   ??? Intimate partner violence:     Fear of current or ex partner: Not on file     Emotionally abused: Not on file     Physically abused: Not on file     Forced sexual  activity: Not on file   Other Topics Concern   ??? Military Service Not Asked   ??? Blood Transfusions Not Asked   ??? Caffeine Concern Not Asked   ??? Occupational Exposure Not Asked   ??? Hobby Hazards Not Asked   ??? Sleep Concern Not Asked   ??? Stress Concern Not Asked   ??? Weight Concern Not Asked   ??? Special Diet Not Asked   ???  Back Care Not Asked   ??? Exercise Not Asked   ??? Bike Helmet Not Asked   ??? Seat Belt Not Asked   ??? Self-Exams Not Asked   Social History Narrative   ??? Not on file        Allergies   Allergen Reactions   ??? Oxycodone-Acetaminophen Anaphylaxis and Swelling   ??? Duloxetine Hcl Rash   ??? Aspirin Other (comments)   ??? Cortisone Nausea and Vomiting     Also rash    ??? Ibuprofen Rash         Current Outpatient Medications   Medication Sig Dispense Refill   ??? triamcinolone acetonide (KENALOG) 0.1 % ointment      ??? fluticasone propionate (FLONASE) 50 mcg/actuation nasal spray 2 Sprays by Both Nostrils route daily.     ??? venlafaxine-SR (EFFEXOR-XR) 150 mg capsule Take 1 Cap by mouth daily (with breakfast). Indications: Anxiousness associated with Depression 15 Cap 1   ??? ciclopirox (PENLAC) 8 % solution Apply  to affected area nightly.     ??? celecoxib (CELEBREX) 100 mg capsule Take 1 Cap by mouth daily.     ??? tiZANidine (ZANAFLEX) 4 mg tablet Take 1 Tab by mouth three (3) times daily. 90 Tab 1   ??? pregabalin (LYRICA) 75 mg capsule Take 1 Cap by mouth two (2) times a day. Max Daily Amount: 150 mg. 28 Cap 0   ??? pregabalin (LYRICA) 100 mg capsule Take 1 Cap by mouth three (3) times daily. Max Daily Amount: 300 mg. 90 Cap 1   ??? gabapentin (NEURONTIN) 600 mg tablet Take 600 mg by mouth three (3) times daily.     ??? hydrOXYzine HCl (ATARAX) 50 mg tablet Take 50 mg by mouth as needed for Anxiety.     ??? QUEtiapine (SEROQUEL) 50 mg tablet Take 1 Tab by mouth nightly. Indications: Additional Medications to Treat Depression (Patient taking differently: Take 200 mg by mouth nightly. Indications: Additional Medications to Treat  Depression) 1 Tab 0   ??? prazosin (MINIPRESS) 1 mg capsule TK 1 C PO QHS           Physical Exam  Vitals signs reviewed.   Constitutional:       Appearance: Normal appearance.   HENT:      Head: Normocephalic and atraumatic.      Mouth/Throat:      Mouth: Mucous membranes are moist.      Pharynx: Oropharynx is clear. No oropharyngeal exudate.   Eyes:      Extraocular Movements: Extraocular movements intact.   Neck:      Musculoskeletal: Normal range of motion and neck supple.   Cardiovascular:      Rate and Rhythm: Normal rate and regular rhythm.      Heart sounds: No murmur. No gallop.    Pulmonary:      Effort: No respiratory distress.      Breath sounds: Normal breath sounds. No wheezing or rales.   Musculoskeletal: Normal range of motion.         General: No swelling.      Comments: Tenderness over the lower back; positive SLR on the right side   Neurological:      Mental Status: He is alert.      Comments: Mental status: Awake, alert, oriented x3, follows simple, complex and inverted commands, no neglect, no extinction to DSS or VSS,.  Speech and languge: fluent, coherent,and comprehension intact  CN: VFF, EOMI, PERRLA, face sensation intact , no  facial asymmetry noted, palate elevation symmetric bilat, SS+SCM 5/5 bilat, tongue midline  Motor: no pronator drift, tone normal throughout, strength 5/5 throughout except the RLE: poor effort.   Sensory: Reduced LT/PP sensation over the RUE and RLE; reduced vibration over the right foot; impaired position sense over the right big toe.  Coordination: FNF, HS accurate w/o dysmetria except unable to do it on the RLE.   DTR: 2+ throughout, toes downgoing BL  Gait: antalgic            No visits with results within 3 Month(s) from this visit.   Latest known visit with results is:   Admission on 11/08/2017, Discharged on 11/09/2017   Component Date Value Ref Range Status   ??? WBC 11/08/2017 6.8  4.6 - 13.2 K/uL Final   ??? RBC 11/08/2017 4.91  4.70 - 5.50 M/uL Final   ??? HGB  11/08/2017 14.5  13.0 - 16.0 g/dL Final   ??? HCT 11/08/2017 43.1  36.0 - 48.0 % Final   ??? MCV 11/08/2017 87.8  74.0 - 97.0 FL Final   ??? MCH 11/08/2017 29.5  24.0 - 34.0 PG Final   ??? MCHC 11/08/2017 33.6  31.0 - 37.0 g/dL Final   ??? RDW 11/08/2017 15.6* 11.6 - 14.5 % Final   ??? PLATELET 11/08/2017 231  135 - 420 K/uL Final   ??? MPV 11/08/2017 9.3  9.2 - 11.8 FL Final   ??? NEUTROPHILS 11/08/2017 47  40 - 73 % Final   ??? LYMPHOCYTES 11/08/2017 44  21 - 52 % Final   ??? MONOCYTES 11/08/2017 6  3 - 10 % Final   ??? EOSINOPHILS 11/08/2017 2  0 - 5 % Final   ??? BASOPHILS 11/08/2017 1  0 - 2 % Final   ??? ABS. NEUTROPHILS 11/08/2017 3.3  1.8 - 8.0 K/UL Final   ??? ABS. LYMPHOCYTES 11/08/2017 3.0  0.9 - 3.6 K/UL Final   ??? ABS. MONOCYTES 11/08/2017 0.4  0.05 - 1.2 K/UL Final   ??? ABS. EOSINOPHILS 11/08/2017 0.1  0.0 - 0.4 K/UL Final   ??? ABS. BASOPHILS 11/08/2017 0.0  0.0 - 0.1 K/UL Final   ??? DF 11/08/2017 AUTOMATED    Final   ??? ALCOHOL(ETHYL),SERUM 11/08/2017 5* 0 - 3 MG/DL Final   ??? Sodium 11/08/2017 139  136 - 145 mmol/L Final   ??? Potassium 11/08/2017 3.9  3.5 - 5.5 mmol/L Final   ??? Chloride 11/08/2017 106  100 - 111 mmol/L Final   ??? CO2 11/08/2017 31  21 - 32 mmol/L Final   ??? Anion gap 11/08/2017 2* 3.0 - 18 mmol/L Final   ??? Glucose 11/08/2017 91  74 - 99 mg/dL Final   ??? BUN 11/08/2017 8  7.0 - 18 MG/DL Final   ??? Creatinine 11/08/2017 1.20  0.6 - 1.3 MG/DL Final   ??? BUN/Creatinine ratio 11/08/2017 7* 12 - 20   Final   ??? GFR est AA 11/08/2017 >60  >60 ml/min/1.68m Final   ??? GFR est non-AA 11/08/2017 >60  >60 ml/min/1.728mFinal    Comment: (NOTE)  Estimated GFR is calculated using the Modification of Diet in Renal   Disease (MDRD) Study equation, reported for both African Americans   (GFRAA) and non-African Americans (GFRNA), and normalized to 1.7334m body surface area. The physician must decide which value applies to   the patient. The MDRD study equation should only be used in   individuals age 71 23 older. It has not been validated  for the   following: pregnant women, patients with serious comorbid conditions,   or on certain medications, or persons with extremes of body size,   muscle mass, or nutritional status.     ??? Calcium 11/08/2017 8.0* 8.5 - 10.1 MG/DL Final   ??? Bilirubin, total 11/08/2017 0.3  0.2 - 1.0 MG/DL Final   ??? ALT (SGPT) 11/08/2017 45  16 - 61 U/L Final   ??? AST (SGOT) 11/08/2017 29  10 - 38 U/L Final   ??? Alk. phosphatase 11/08/2017 61  45 - 117 U/L Final   ??? Protein, total 11/08/2017 7.1  6.4 - 8.2 g/dL Final   ??? Albumin 11/08/2017 3.4  3.4 - 5.0 g/dL Final   ??? Globulin 11/08/2017 3.7  2.0 - 4.0 g/dL Final   ??? A-G Ratio 11/08/2017 0.9  0.8 - 1.7   Final   ??? BENZODIAZEPINES 11/08/2017 NEGATIVE   NEG   Final   ??? BARBITURATES 11/08/2017 NEGATIVE   NEG   Final   ??? THC (TH-CANNABINOL) 11/08/2017 POSITIVE* NEG   Final   ??? OPIATES 11/08/2017 NEGATIVE   NEG   Final   ??? PCP(PHENCYCLIDINE) 11/08/2017 NEGATIVE   NEG   Final   ??? COCAINE 11/08/2017 NEGATIVE   NEG   Final   ??? AMPHETAMINES 11/08/2017 NEGATIVE   NEG   Final   ??? METHADONE 11/08/2017 NEGATIVE   NEG   Final   ??? HDSCOM 11/08/2017 (NOTE)   Final    Comment: Specimen analysis was performed without chain of custody handling.    These results should be used for medical purposes only and not for   legal or employment purposes.  Unconfirmed screening results must not   be used for non-medical purposes.    The cut-off concentration for positive results are as follows:    AMPH     1000 ng/mL  BARB      200 ng/mL  BENZ      200 ng/mL  COC       300 ng/mL  METH      300 ng/mL  OPI       300 ng/mL  PCP        25 ng/mL  THC        50 ng/mL         NCS/EMG FINDINGS:  ?? Evaluation of the Left median motor, the Right median motor, the Left peroneal motor, the Right peroneal motor, the Left tibial motor, the Right tibial motor, the Left ulnar motor, the Right ulnar motor, the Left superficial peroneal sensory, the Right superficial peroneal sensory, the Left sural sensory, and the Right sural  sensory nerves were unremarkable.  ?? The Left Median 2nd Digit sensory nerve showed reduced amplitude (12.5 ??V).  ?? The Right Median 2nd Digit sensory nerve showed prolonged distal peak latency (3.6 ms) and reduced amplitude (12.4 ??V).  ?? The Left ulnar sensory and the Right ulnar sensory nerves showed reduced amplitude (L8.6, R6.3 ??V) and decreased conduction velocity (Wrist-5th Digit, L45.8, R47.8 m/s).  ??  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   3-Normal EMG of RLE muscles.       EEG REPORT:  This is a 16-channel routine EEG done using the international 10-20 electrode placement system.  The predominant background consists of 8 Hz posterior predominant and symmetric activity which attenuates with eye opening.  Secondary rhythms consists of 5-6 Hz lower amplitude diffuse symmetric activity consistent with drowsiness which transitions into brief periods of vertex waves, poorly defined K-complexes and sleep spindles consistent with brief stage II sleep.  No abnormal photoparoxysmal responses were seen.  No abnormalities were seen with hyperventilation.  No epileptiform abnormalities were observed.  ??  IMPRESSION:  This is a normal awake, drowsy, and asleep EEG.  ??      ICD-10-CM ICD-9-CM    1. Carpal tunnel syndrome of right wrist G56.01 354.0 REFERRAL TO ORTHOPEDICS   2. Chronic right-sided low back pain with right-sided sciatica M54.41 724.2     G89.29 724.3      338.29      A 48 yo male patient with the above medical problems came for follow up. Patient had chronic pain over the neck and back radiating to the upper and lower extremities. He has has RLE pain and difficulty walking. Most of the symptoms are radicular in both the UE and LEs. In addition,has some symptoms of bilateral CTS. His EMG was reported to be normal but it was only of the RUE. EMG was repeated during  his last visit including the UE and LE; it only showed mild right CTS.   MRI reviewed above; images not available for review. He is on Pregabalin 167m PO TID. His passing out episodes are possible syncopal episodes; less likely seizures. EEG was done during his last visit; normal.  He has follow up with pain management and spine surgery. Referred ho hand surgery for the hand pain which is functionally out of proportion to the findings on EMG.

## 2018-03-26 NOTE — Progress Notes (Signed)
Jim Shaw is a 48 y.o. male .presents for Carpal Tunnel and Numbness (leg)   .    A 48 yo male patient with medical history of major depression, chronic low back pan came for follow up of of tingling and numbness over the extremities. He was last seen in the clinic on Dec 14/2020. EMG of the UE and LEs was ordered. In addition, for syncopal episodes, EEG was ordered. The EMG of the UE and LEs only showed mild right CTS. He continued to have the same sympoms. He has numbness over his hands and feet, toes feel cold. He continued to have the neck and lower back pain radiating to the extremities.     From last visit:   It started from the right hand hand gradually involved he left hand. He feels as if hs hands go to 'sleep' with tingling, intermittently; he tends to shake his hands. He has some difficulty holding objects. He has no difficulty lifting his arms up or moving at the elbow. He started has low back pain which is aching and started to have pain over the RLE(radiating from the low back). It gets worse when standing and walking. He has no involvement of the LLE. He has some tingling and numbness over the toes. He has mild RLE weakness. He has occasional falls because his RLE gives way. He has neck pain of 6-8 months duration; aching and radiates to the UEs. Neck feels stiff. He has been having headache for the past 6 months: frontal, occasionally occipital, thobbing, 7-8/10, with occasional nausea and vomiting; has photophobia and phonophobia. Lass for about 2-3 hours. No response to pain medications. He also has 'passing out episodes': when he tries to stand, feels blurred/blank, and blanks out for about 5 seconds; some stiffness but no tonic clonic movements. No post ictal confusion. Has episodes of urine incontinence.  He has MRI of the brain: didn't show acute lesions or mass effect; there are white matter ischemic changes, MRI of the LS spine showed mild  degenerative changes at multiple levels but foraminal narrowing and compression of the exiting L5 nerve root at L5-S1 level; no significant spinal canal stenosis. MRI of the C-spine showed multilevel degenerative changes; C4-5 foraminal stenosis; moderate spinal canal narrowing.     Carpal Tunnel   Associated symptoms include headaches (worse recently) and shortness of breath. Pertinent negatives include no chest pain.   Numbness   Primary symptoms include focal weakness (right leg).Pertinent negatives include no memory loss. Associated symptoms include shortness of breath, headaches (worse recently) and nausea. Pertinent negatives include no chest pain and no vomiting (when upset).       Review of Systems   Constitutional: Positive for chills and malaise/fatigue. Negative for diaphoresis, fever and weight loss.   HENT: Negative for hearing loss and tinnitus.    Eyes: Positive for blurred vision (snowy). Negative for double vision.   Respiratory: Positive for cough (during sleep) and shortness of breath. Negative for hemoptysis, sputum production and wheezing.    Cardiovascular: Positive for palpitations (with anxiety) and leg swelling (right ankle). Negative for chest pain.   Gastrointestinal: Positive for constipation, heartburn (occasional) and nausea. Negative for diarrhea and vomiting (when upset).   Genitourinary: Positive for frequency. Negative for dysuria, hematuria and urgency.   Musculoskeletal: Positive for back pain, falls (3 times; right leg gives out), joint pain (right elbow) and neck pain. Negative for myalgias.   Skin: Positive for itching and rash.   Neurological: Positive for  dizziness, tingling, tremors (sometimes), focal weakness (right leg), loss of consciousness (transent passing out episodes), numbness and headaches (worse recently). Negative for seizures.   Endo/Heme/Allergies: Does not bruise/bleed easily.   Psychiatric/Behavioral: Positive for depression (with past history of  suicidal ideations; was admitted to hospital 3 times this year. ) and hallucinations (occasionally people talking about 'me'). Negative for memory loss and suicidal ideas. The patient has insomnia (going and maintaning sleep; from 3-7AM; sleep study coming January; snores).        Past Medical History:   Diagnosis Date   ??? Costochondritis    ??? Major depressive disorder 11/08/2017   ??? Psychiatric disorder        Past Surgical History:   Procedure Laterality Date   ??? HX ORTHOPAEDIC      Surgery on knee (right knee)         Family History   Problem Relation Age of Onset   ??? No Known Problems Mother    ??? No Known Problems Father         Social History     Socioeconomic History   ??? Marital status: MARRIED     Spouse name: Not on file   ??? Number of children: Not on file   ??? Years of education: Not on file   ??? Highest education level: Not on file   Occupational History   ??? Not on file   Social Needs   ??? Financial resource strain: Not on file   ??? Food insecurity:     Worry: Not on file     Inability: Not on file   ??? Transportation needs:     Medical: Not on file     Non-medical: Not on file   Tobacco Use   ??? Smoking status: Current Every Day Smoker     Packs/day: 0.25     Years: 20.00     Pack years: 5.00   ??? Smokeless tobacco: Never Used   Substance and Sexual Activity   ??? Alcohol use: Yes     Comment: 5-6 shots/day   ??? Drug use: Yes     Types: Marijuana   ??? Sexual activity: Not on file   Lifestyle   ??? Physical activity:     Days per week: Not on file     Minutes per session: Not on file   ??? Stress: Not on file   Relationships   ??? Social connections:     Talks on phone: Not on file     Gets together: Not on file     Attends religious service: Not on file     Active member of club or organization: Not on file     Attends meetings of clubs or organizations: Not on file     Relationship status: Not on file   ??? Intimate partner violence:     Fear of current or ex partner: Not on file     Emotionally abused: Not on file      Physically abused: Not on file     Forced sexual activity: Not on file   Other Topics Concern   ??? Military Service Not Asked   ??? Blood Transfusions Not Asked   ??? Caffeine Concern Not Asked   ??? Occupational Exposure Not Asked   ??? Hobby Hazards Not Asked   ??? Sleep Concern Not Asked   ??? Stress Concern Not Asked   ??? Weight Concern Not Asked   ??? Special Diet Not Asked   ???  Back Care Not Asked   ??? Exercise Not Asked   ??? Bike Helmet Not Asked   ??? Seat Belt Not Asked   ??? Self-Exams Not Asked   Social History Narrative   ??? Not on file        Allergies   Allergen Reactions   ??? Oxycodone-Acetaminophen Anaphylaxis and Swelling   ??? Duloxetine Hcl Rash   ??? Aspirin Other (comments)   ??? Cortisone Nausea and Vomiting     Also rash    ??? Ibuprofen Rash         Current Outpatient Medications   Medication Sig Dispense Refill   ??? triamcinolone acetonide (KENALOG) 0.1 % ointment      ??? fluticasone propionate (FLONASE) 50 mcg/actuation nasal spray 2 Sprays by Both Nostrils route daily.     ??? venlafaxine-SR (EFFEXOR-XR) 150 mg capsule Take 1 Cap by mouth daily (with breakfast). Indications: Anxiousness associated with Depression 15 Cap 1   ??? ciclopirox (PENLAC) 8 % solution Apply  to affected area nightly.     ??? celecoxib (CELEBREX) 100 mg capsule Take 1 Cap by mouth daily.     ??? tiZANidine (ZANAFLEX) 4 mg tablet Take 1 Tab by mouth three (3) times daily. 90 Tab 1   ??? pregabalin (LYRICA) 75 mg capsule Take 1 Cap by mouth two (2) times a day. Max Daily Amount: 150 mg. 28 Cap 0   ??? pregabalin (LYRICA) 100 mg capsule Take 1 Cap by mouth three (3) times daily. Max Daily Amount: 300 mg. 90 Cap 1   ??? gabapentin (NEURONTIN) 600 mg tablet Take 600 mg by mouth three (3) times daily.     ??? hydrOXYzine HCl (ATARAX) 50 mg tablet Take 50 mg by mouth as needed for Anxiety.     ??? QUEtiapine (SEROQUEL) 50 mg tablet Take 1 Tab by mouth nightly. Indications: Additional Medications to Treat Depression (Patient taking  differently: Take 200 mg by mouth nightly. Indications: Additional Medications to Treat Depression) 1 Tab 0   ??? prazosin (MINIPRESS) 1 mg capsule TK 1 C PO QHS           Physical Exam  Vitals signs reviewed.   Constitutional:       Appearance: Normal appearance.   HENT:      Head: Normocephalic and atraumatic.      Mouth/Throat:      Mouth: Mucous membranes are moist.      Pharynx: Oropharynx is clear. No oropharyngeal exudate.   Eyes:      Extraocular Movements: Extraocular movements intact.   Neck:      Musculoskeletal: Normal range of motion and neck supple.   Cardiovascular:      Rate and Rhythm: Normal rate and regular rhythm.      Heart sounds: No murmur. No gallop.    Pulmonary:      Effort: No respiratory distress.      Breath sounds: Normal breath sounds. No wheezing or rales.   Musculoskeletal: Normal range of motion.         General: No swelling.      Comments: Tenderness over the lower back; positive SLR on the right side   Neurological:      Mental Status: He is alert.      Comments: Mental status: Awake, alert, oriented x3, follows simple, complex and inverted commands, no neglect, no extinction to DSS or VSS,.  Speech and languge: fluent, coherent,and comprehension intact  CN: VFF, EOMI, PERRLA, face sensation intact , no  facial asymmetry noted, palate elevation symmetric bilat, SS+SCM 5/5 bilat, tongue midline  Motor: no pronator drift, tone normal throughout, strength 5/5 throughout except the RLE: poor effort.   Sensory: Reduced LT/PP sensation over the RUE and RLE; reduced vibration over the right foot; impaired position sense over the right big toe.  Coordination: FNF, HS accurate w/o dysmetria except unable to do it on the RLE.   DTR: 2+ throughout, toes downgoing BL  Gait: antalgic            No visits with results within 3 Month(s) from this visit.   Latest known visit with results is:   Admission on 11/08/2017, Discharged on 11/09/2017   Component Date Value Ref Range Status    ??? WBC 11/08/2017 6.8  4.6 - 13.2 K/uL Final   ??? RBC 11/08/2017 4.91  4.70 - 5.50 M/uL Final   ??? HGB 11/08/2017 14.5  13.0 - 16.0 g/dL Final   ??? HCT 11/08/2017 43.1  36.0 - 48.0 % Final   ??? MCV 11/08/2017 87.8  74.0 - 97.0 FL Final   ??? MCH 11/08/2017 29.5  24.0 - 34.0 PG Final   ??? MCHC 11/08/2017 33.6  31.0 - 37.0 g/dL Final   ??? RDW 11/08/2017 15.6* 11.6 - 14.5 % Final   ??? PLATELET 11/08/2017 231  135 - 420 K/uL Final   ??? MPV 11/08/2017 9.3  9.2 - 11.8 FL Final   ??? NEUTROPHILS 11/08/2017 47  40 - 73 % Final   ??? LYMPHOCYTES 11/08/2017 44  21 - 52 % Final   ??? MONOCYTES 11/08/2017 6  3 - 10 % Final   ??? EOSINOPHILS 11/08/2017 2  0 - 5 % Final   ??? BASOPHILS 11/08/2017 1  0 - 2 % Final   ??? ABS. NEUTROPHILS 11/08/2017 3.3  1.8 - 8.0 K/UL Final   ??? ABS. LYMPHOCYTES 11/08/2017 3.0  0.9 - 3.6 K/UL Final   ??? ABS. MONOCYTES 11/08/2017 0.4  0.05 - 1.2 K/UL Final   ??? ABS. EOSINOPHILS 11/08/2017 0.1  0.0 - 0.4 K/UL Final   ??? ABS. BASOPHILS 11/08/2017 0.0  0.0 - 0.1 K/UL Final   ??? DF 11/08/2017 AUTOMATED    Final   ??? ALCOHOL(ETHYL),SERUM 11/08/2017 5* 0 - 3 MG/DL Final   ??? Sodium 11/08/2017 139  136 - 145 mmol/L Final   ??? Potassium 11/08/2017 3.9  3.5 - 5.5 mmol/L Final   ??? Chloride 11/08/2017 106  100 - 111 mmol/L Final   ??? CO2 11/08/2017 31  21 - 32 mmol/L Final   ??? Anion gap 11/08/2017 2* 3.0 - 18 mmol/L Final   ??? Glucose 11/08/2017 91  74 - 99 mg/dL Final   ??? BUN 11/08/2017 8  7.0 - 18 MG/DL Final   ??? Creatinine 11/08/2017 1.20  0.6 - 1.3 MG/DL Final   ??? BUN/Creatinine ratio 11/08/2017 7* 12 - 20   Final   ??? GFR est AA 11/08/2017 >60  >60 ml/min/1.44m Final   ??? GFR est non-AA 11/08/2017 >60  >60 ml/min/1.766mFinal    Comment: (NOTE)  Estimated GFR is calculated using the Modification of Diet in Renal   Disease (MDRD) Study equation, reported for both African Americans   (GFRAA) and non-African Americans (GFRNA), and normalized to 1.7372m body surface area. The physician must decide which value applies to    the patient. The MDRD study equation should only be used in   individuals age 40 46 older. It has not been  validated for the   following: pregnant women, patients with serious comorbid conditions,   or on certain medications, or persons with extremes of body size,   muscle mass, or nutritional status.     ??? Calcium 11/08/2017 8.0* 8.5 - 10.1 MG/DL Final   ??? Bilirubin, total 11/08/2017 0.3  0.2 - 1.0 MG/DL Final   ??? ALT (SGPT) 11/08/2017 45  16 - 61 U/L Final   ??? AST (SGOT) 11/08/2017 29  10 - 38 U/L Final   ??? Alk. phosphatase 11/08/2017 61  45 - 117 U/L Final   ??? Protein, total 11/08/2017 7.1  6.4 - 8.2 g/dL Final   ??? Albumin 11/08/2017 3.4  3.4 - 5.0 g/dL Final   ??? Globulin 11/08/2017 3.7  2.0 - 4.0 g/dL Final   ??? A-G Ratio 11/08/2017 0.9  0.8 - 1.7   Final   ??? BENZODIAZEPINES 11/08/2017 NEGATIVE   NEG   Final   ??? BARBITURATES 11/08/2017 NEGATIVE   NEG   Final   ??? THC (TH-CANNABINOL) 11/08/2017 POSITIVE* NEG   Final   ??? OPIATES 11/08/2017 NEGATIVE   NEG   Final   ??? PCP(PHENCYCLIDINE) 11/08/2017 NEGATIVE   NEG   Final   ??? COCAINE 11/08/2017 NEGATIVE   NEG   Final   ??? AMPHETAMINES 11/08/2017 NEGATIVE   NEG   Final   ??? METHADONE 11/08/2017 NEGATIVE   NEG   Final   ??? HDSCOM 11/08/2017 (NOTE)   Final    Comment: Specimen analysis was performed without chain of custody handling.    These results should be used for medical purposes only and not for   legal or employment purposes.  Unconfirmed screening results must not   be used for non-medical purposes.    The cut-off concentration for positive results are as follows:    AMPH     1000 ng/mL  BARB      200 ng/mL  BENZ      200 ng/mL  COC       300 ng/mL  METH      300 ng/mL  OPI       300 ng/mL  PCP        25 ng/mL  THC        50 ng/mL         NCS/EMG FINDINGS:  ?? Evaluation of the Left median motor, the Right median motor, the Left peroneal motor, the Right peroneal motor, the Left tibial motor, the Right  tibial motor, the Left ulnar motor, the Right ulnar motor, the Left superficial peroneal sensory, the Right superficial peroneal sensory, the Left sural sensory, and the Right sural sensory nerves were unremarkable.  ?? The Left Median 2nd Digit sensory nerve showed reduced amplitude (12.5 ??V).  ?? The Right Median 2nd Digit sensory nerve showed prolonged distal peak latency (3.6 ms) and reduced amplitude (12.4 ??V).  ?? The Left ulnar sensory and the Right ulnar sensory nerves showed reduced amplitude (L8.6, R6.3 ??V) and decreased conduction velocity (Wrist-5th Digit, L45.8, R47.8 m/s).  ??  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   3-Normal EMG of RLE muscles.       EEG REPORT:  This is a 16-channel routine EEG done using the international 10-20 electrode placement system.  The predominant background consists of 8 Hz posterior predominant and symmetric activity which attenuates with eye opening.  Secondary rhythms consists of 5-6 Hz lower amplitude diffuse symmetric activity consistent with drowsiness which transitions into brief periods of vertex waves, poorly defined K-complexes and sleep spindles consistent with brief stage II sleep.  No abnormal photoparoxysmal responses were seen.  No abnormalities were seen with hyperventilation.  No epileptiform abnormalities were observed.  ??  IMPRESSION:  This is a normal awake, drowsy, and asleep EEG.  ??      ICD-10-CM ICD-9-CM    1. Carpal tunnel syndrome of right wrist G56.01 354.0 REFERRAL TO ORTHOPEDICS   2. Chronic right-sided low back pain with right-sided sciatica M54.41 724.2     G89.29 724.3      338.29      A 48 yo male patient with the above medical problems came for follow up. Patient had chronic pain over the neck and back radiating to the upper and lower extremities. He has has RLE pain and difficulty walking. Most of the  symptoms are radicular in both the UE and LEs. In addition,has some symptoms of bilateral CTS. His EMG was reported to be normal but it was only of the RUE. EMG was repeated during his last visit including the UE and LE; it only showed mild right CTS.   MRI reviewed above; images not available for review. He is on Pregabalin 164m PO TID. His passing out episodes are possible syncopal episodes; less likely seizures. EEG was done during his last visit; normal.  He has follow up with pain management and spine surgery. Referred ho hand surgery for the hand pain which is functionally out of proportion to the findings on EMG.

## 2018-03-30 NOTE — Telephone Encounter (Signed)
Patient called to schedule status post seeing neurology.  The first available was 04/13.  I did put him on the cancelation list.  He is requesting a sooner appointment if there is anywhere he can be fit in.  Please advise. [phone] Patient

## 2018-03-30 NOTE — Telephone Encounter (Addendum)
Returned call to patient, Reached unidentified voicemail, left message, identified myself/facility/callback number, requested return call to facility.

## 2018-04-02 ENCOUNTER — Ambulatory Visit
Admit: 2018-04-02 | Discharge: 2018-04-02 | Payer: PRIVATE HEALTH INSURANCE | Attending: Orthopaedic Surgery | Primary: Family Medicine

## 2018-04-02 ENCOUNTER — Ambulatory Visit: Attending: Orthopaedic Surgery | Primary: Family Medicine

## 2018-04-02 DIAGNOSIS — G5601 Carpal tunnel syndrome, right upper limb: Secondary | ICD-10-CM

## 2018-04-02 MED ORDER — TRIAMCINOLONE ACETONIDE 10 MG/ML SUSP FOR INJECTION
10 mg/mL | Freq: Once | INTRAMUSCULAR | 0 refills | Status: AC
Start: 2018-04-02 — End: 2018-04-02

## 2018-04-02 NOTE — Progress Notes (Signed)
 Chief Complaint   Patient presents with   . Hand Pain     Bil   . Elbow Pain     Right

## 2018-04-02 NOTE — Progress Notes (Signed)
Jim Shaw is a 48 y.o. male right handed unknown employment.  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    Vitals:    04/02/18 1449   BP: 130/88   Pulse: 66   Resp: 18   Temp: 98 ??F (36.7 ??C)   TempSrc: Oral   SpO2: 97%   Weight: 307 lb 3.2 oz (139.3 kg)   Height:  (1.854 m)   PainSc:   5   PainLoc: Elbow           Chief Complaint   Patient presents with   ??? Hand Pain     Bil   ??? Elbow Pain     Right         HPI: Patient comes in today with a history of right hand numbness and tingling as well as pain and locking in his right index finger.    Date of onset:  indeterminate    Injury: No    Prior Treatment:  Yes: Comment: previous inconsistent brace wear    Numbness/ Tingling: Yes: Comment: right hand    ROS: Review of Systems - General ROS: negative  Psychological ROS: negative  Allergy and Immunology ROS: negative  Hematological and Lymphatic ROS: negative  Respiratory ROS: no cough, shortness of breath, or wheezing  Cardiovascular ROS: no chest pain or dyspnea on exertion  Gastrointestinal ROS: no abdominal pain, change in bowel habits, or black or bloody stools  Musculoskeletal ROS: positive for - pain in hand - right  Neurological ROS: positive for - numbness/tingling  Dermatological ROS: negative    Past Medical History:   Diagnosis Date   ??? Costochondritis    ??? Major depressive disorder 11/08/2017   ??? Psychiatric disorder        Past Surgical History:   Procedure Laterality Date   ??? HX ORTHOPAEDIC      Surgery on knee (right knee)        Current Outpatient Medications   Medication Sig Dispense Refill   ??? mirtazapine (REMERON) 15 mg tablet TK 1 T PO HS     ??? triamcinolone acetonide (KENALOG) 0.1 % ointment      ??? fluticasone propionate (FLONASE) 50 mcg/actuation nasal spray 2 Sprays by Both Nostrils route daily.     ??? ciclopirox (PENLAC) 8 % solution Apply  to affected area nightly.     ??? celecoxib (CELEBREX) 100 mg capsule Take 1 Cap by mouth daily.     ??? tiZANidine  (ZANAFLEX) 4 mg tablet Take 1 Tab by mouth three (3) times daily. 90 Tab 1   ??? hydrOXYzine HCl (ATARAX) 50 mg tablet Take 50 mg by mouth as needed for Anxiety.     ??? venlafaxine-SR (EFFEXOR-XR) 150 mg capsule Take 1 Cap by mouth daily (with breakfast). Indications: Anxiousness associated with Depression 15 Cap 1   ??? QUEtiapine (SEROQUEL) 50 mg tablet Take 1 Tab by mouth nightly. Indications: Additional Medications to Treat Depression (Patient taking differently: Take 200 mg by mouth nightly. Indications: Additional Medications to Treat Depression) 1 Tab 0   ??? prazosin (MINIPRESS) 1 mg capsule TK 1 C PO QHS     ??? pregabalin (LYRICA) 75 mg capsule Take 1 Cap by mouth two (2) times a day. Max Daily Amount: 150 mg. 28 Cap 0   ??? pregabalin (LYRICA) 100 mg capsule Take 1 Cap by mouth three (3) times daily. Max Daily Amount: 300 mg. 90 Cap 1   ??? gabapentin (NEURONTIN) 600 mg tablet Take 600  mg by mouth three (3) times daily.         Allergies   Allergen Reactions   ??? Oxycodone-Acetaminophen Anaphylaxis and Swelling   ??? Duloxetine Hcl Rash   ??? Aspirin Other (comments)   ??? Cortisone Nausea and Vomiting     Also rash    ??? Ibuprofen Rash         PE:     NEUROVASCULAR    Examination L R Examination L R   Carpal Comp. - + Pronator Comp. - -   Carpal Tinel - + Pronator Tinel - -   Phalen's - + Pronator Stress - -   Cubital Comp. - - Guyon Comp. - -   Cubital Tinel - - Guyon Tinel - -   Elbow Hyperflexion - - Adson's - -   Spurling's - - SC Comp. - -   PCB Median abn - - SC Tinel - -   Radial Tinel - - IC Comp. - -   Digital Tinel - - IC Tinel - -   Radial 2-Pt WNL WNL Ulnar 2-Pt WNL WNL     Radial Pulse: 2+  Capillary Refill: < 2 sec  Allen: Not Performed  Digital Allen: Not Performed      NEUROVASCULAR    Examination L R Examination L R   Carpal Comp. - + Pronator Comp. - -   Carpal Tinel - + Pronator Tinel - -   Phalen's - + Pronator Stress - -   Cubital Comp. - - Guyon Comp. - -   Cubital Tinel - - Guyon Tinel - -   Elbow  Hyperflexion - - Adson's - -   Spurling's - - SC Comp. - -   PCB Median abn - - SC Tinel - -   Radial Tinel - - IC Comp. - -   Digital Tinel - - IC Tinel - -   Radial 2-Pt WNL WNL Ulnar 2-Pt WNL WNL     Radial Pulse: 2+  Capillary Refill: < 2 sec  Allen: Not Performed  Digital Allen: Not Performed        Imaging: none indicated    NCS/EMG FINDINGS:  ?? Evaluation of the Left median motor, the Right median motor, the Left peroneal motor, the Right peroneal motor, the Left tibial motor, the Right tibial motor, the Left ulnar motor, the Right ulnar motor, the Left superficial peroneal sensory, the Right superficial peroneal sensory, the Left sural sensory, and the Right sural sensory nerves were unremarkable.  ?? The Left Median 2nd Digit sensory nerve showed reduced amplitude (12.5 ??V).  ?? The Right Median 2nd Digit sensory nerve showed prolonged distal peak latency (3.6 ms) and reduced amplitude (12.4 ??V).  ?? The Left ulnar sensory and the Right ulnar sensory nerves showed reduced amplitude (L8.6, R6.3 ??V) and decreased conduction velocity (Wrist-5th Digit, L45.8, R47.8 m/s).  ??  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   3-Normal EMG of RLE muscles.         ICD-10-CM ICD-9-CM    1. Right carpal tunnel syndrome G56.01 354.0 TRIAMCINOLONE ACETONIDE INJ      triamcinolone acetonide (KENALOG) 10 mg/mL injection      INJECT CARPAL TUNNEL      AMB SUPPLY ORDER   2. Trigger index finger of right hand M65.321 727.03 TRIAMCINOLONE ACETONIDE INJ      triamcinolone acetonide (  KENALOG) 10 mg/mL injection      INJECT TENDON SHEATH/LIGAMENT       Plan:     Right carpal tunnel injection    Right carpal tunnel brace to be worn at night    Right index A1 pulley injection    Follow-up PRN    Plan was reviewed with patient, who verbalized agreement and understanding of the plan    [facility]  OFFICE PROCEDURE PROGRESS NOTE        Chart reviewed for the following:   I, Phynix Horton A Tomekia Helton, DO, have reviewed the History, Physical and updated the Allergic reactions for [facility]     TIME OUT performed immediately prior to start of procedure:   I, Peggyann Shoals, DO, have performed the following reviews on [facility] prior to the start of the procedure:            * Patient was identified by name and date of birth   * Agreement on procedure being performed was verified  * Risks and Benefits explained to the patient  * Procedure site verified and marked as necessary  * Patient was positioned for comfort  * Consent was signed and verified     Time: 15:30      Date of procedure: 04/03/2018    Procedure performed by:  Peggyann Shoals, DO    Provider assisted by: Shanda Bumps LPN    Patient assisted by: self    How tolerated by patient: tolerated the procedure well with no complications    Post Procedural Pain Scale: 0 - No Hurt    Comments: none    Procedure:  After consent was obtained, using sterile technique the tendon and carpal tunnel was prepped. Local anesthetic used: 1% lidocaine. Kenalog 5 mg X2 and was then injected and the needle withdrawn.  The procedure was well tolerated.  The patient is asked to continue to rest the area for a few more days before resuming regular activities.  It may be more painful for the first 1-2 days.  Watch for fever, or increased swelling or persistent pain in the joint. Call or return to clinic prn if such symptoms occur or there is failure to improve as anticipated.

## 2018-04-02 NOTE — Progress Notes (Signed)
Jim Shaw is a 48 y.o. male right handed unknown employment.  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    Vitals:    04/02/18 1449   BP: 130/88   Pulse: 66   Resp: 18   Temp: 98 ??F (36.7 ??C)   TempSrc: Oral   SpO2: 97%   Weight: 307 lb 3.2 oz (139.3 kg)   Height: 6\' 1"  (1.854 m)   PainSc:   5   PainLoc: Elbow           Chief Complaint   Patient presents with   ??? Hand Pain     Bil   ??? Elbow Pain     Right         HPI: Patient comes in today with a history of right hand numbness and tingling as well as pain and locking in his right index finger.    Date of onset:  indeterminate    Injury: No    Prior Treatment:  Yes: Comment: previous inconsistent brace wear    Numbness/ Tingling: Yes: Comment: right hand    ROS: Review of Systems - General ROS: negative  Psychological ROS: negative  Allergy and Immunology ROS: negative  Hematological and Lymphatic ROS: negative  Respiratory ROS: no cough, shortness of breath, or wheezing  Cardiovascular ROS: no chest pain or dyspnea on exertion  Gastrointestinal ROS: no abdominal pain, change in bowel habits, or black or bloody stools  Musculoskeletal ROS: positive for - pain in hand - right  Neurological ROS: positive for - numbness/tingling  Dermatological ROS: negative    Past Medical History:   Diagnosis Date   ??? Costochondritis    ??? Major depressive disorder 11/08/2017   ??? Psychiatric disorder        Past Surgical History:   Procedure Laterality Date   ??? HX ORTHOPAEDIC      Surgery on knee (right knee)        Current Outpatient Medications   Medication Sig Dispense Refill   ??? mirtazapine (REMERON) 15 mg tablet TK 1 T PO HS     ??? triamcinolone acetonide (KENALOG) 0.1 % ointment      ??? fluticasone propionate (FLONASE) 50 mcg/actuation nasal spray 2 Sprays by Both Nostrils route daily.     ??? ciclopirox (PENLAC) 8 % solution Apply  to affected area nightly.     ??? celecoxib (CELEBREX) 100 mg capsule Take 1 Cap by mouth daily.      ??? tiZANidine (ZANAFLEX) 4 mg tablet Take 1 Tab by mouth three (3) times daily. 90 Tab 1   ??? hydrOXYzine HCl (ATARAX) 50 mg tablet Take 50 mg by mouth as needed for Anxiety.     ??? venlafaxine-SR (EFFEXOR-XR) 150 mg capsule Take 1 Cap by mouth daily (with breakfast). Indications: Anxiousness associated with Depression 15 Cap 1   ??? QUEtiapine (SEROQUEL) 50 mg tablet Take 1 Tab by mouth nightly. Indications: Additional Medications to Treat Depression (Patient taking differently: Take 200 mg by mouth nightly. Indications: Additional Medications to Treat Depression) 1 Tab 0   ??? prazosin (MINIPRESS) 1 mg capsule TK 1 C PO QHS     ??? pregabalin (LYRICA) 75 mg capsule Take 1 Cap by mouth two (2) times a day. Max Daily Amount: 150 mg. 28 Cap 0   ??? pregabalin (LYRICA) 100 mg capsule Take 1 Cap by mouth three (3) times daily. Max Daily Amount: 300 mg. 90 Cap 1   ??? gabapentin (NEURONTIN) 600 mg tablet Take 600  mg by mouth three (3) times daily.         Allergies   Allergen Reactions   ??? Oxycodone-Acetaminophen Anaphylaxis and Swelling   ??? Duloxetine Hcl Rash   ??? Aspirin Other (comments)   ??? Cortisone Nausea and Vomiting     Also rash    ??? Ibuprofen Rash         PE:     NEUROVASCULAR    Examination L R Examination L R   Carpal Comp. - + Pronator Comp. - -   Carpal Tinel - + Pronator Tinel - -   Phalen's - + Pronator Stress - -   Cubital Comp. - - Guyon Comp. - -   Cubital Tinel - - Guyon Tinel - -   Elbow Hyperflexion - - Adson's - -   Spurling's - - SC Comp. - -   PCB Median abn - - SC Tinel - -   Radial Tinel - - IC Comp. - -   Digital Tinel - - IC Tinel - -   Radial 2-Pt WNL WNL Ulnar 2-Pt WNL WNL     Radial Pulse: 2+  Capillary Refill: < 2 sec  Allen: Not Performed  Digital Allen: Not Performed      NEUROVASCULAR    Examination L R Examination L R   Carpal Comp. - + Pronator Comp. - -   Carpal Tinel - + Pronator Tinel - -   Phalen's - + Pronator Stress - -   Cubital Comp. - - Guyon Comp. - -    Cubital Tinel - - Guyon Tinel - -   Elbow Hyperflexion - - Adson's - -   Spurling's - - SC Comp. - -   PCB Median abn - - SC Tinel - -   Radial Tinel - - IC Comp. - -   Digital Tinel - - IC Tinel - -   Radial 2-Pt WNL WNL Ulnar 2-Pt WNL WNL     Radial Pulse: 2+  Capillary Refill: < 2 sec  Allen: Not Performed  Digital Allen: Not Performed        Imaging: none indicated    NCS/EMG FINDINGS:  ?? Evaluation of the Left median motor, the Right median motor, the Left peroneal motor, the Right peroneal motor, the Left tibial motor, the Right tibial motor, the Left ulnar motor, the Right ulnar motor, the Left superficial peroneal sensory, the Right superficial peroneal sensory, the Left sural sensory, and the Right sural sensory nerves were unremarkable.  ?? The Left Median 2nd Digit sensory nerve showed reduced amplitude (12.5 ??V).  ?? The Right Median 2nd Digit sensory nerve showed prolonged distal peak latency (3.6 ms) and reduced amplitude (12.4 ??V).  ?? The Left ulnar sensory and the Right ulnar sensory nerves showed reduced amplitude (L8.6, R6.3 ??V) and decreased conduction velocity (Wrist-5th Digit, L45.8, R47.8 m/s).  ??  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   3-Normal EMG of RLE muscles.         ICD-10-CM ICD-9-CM    1. Right carpal tunnel syndrome G56.01 354.0 TRIAMCINOLONE ACETONIDE INJ      triamcinolone acetonide (KENALOG) 10 mg/mL injection      INJECT CARPAL TUNNEL      AMB SUPPLY ORDER   2. Trigger index finger of right hand M65.321 727.03 TRIAMCINOLONE ACETONIDE INJ      triamcinolone acetonide (  KENALOG) 10 mg/mL injection      INJECT TENDON SHEATH/LIGAMENT       Plan:     Right carpal tunnel injection    Right carpal tunnel brace to be worn at night    Right index A1 pulley injection    Follow-up PRN     Plan was reviewed with patient, who verbalized agreement and understanding of the plan    [facility]  OFFICE PROCEDURE PROGRESS NOTE        Chart reviewed for the following:   I, Mallorey Odonell A Joseantonio Dittmar, DO, have reviewed the History, Physical and updated the Allergic reactions for [facility]     TIME OUT performed immediately prior to start of procedure:   I, Peggyann Shoals, DO, have performed the following reviews on [facility] prior to the start of the procedure:            * Patient was identified by name and date of birth   * Agreement on procedure being performed was verified  * Risks and Benefits explained to the patient  * Procedure site verified and marked as necessary  * Patient was positioned for comfort  * Consent was signed and verified     Time: 15:30      Date of procedure: 04/03/2018    Procedure performed by:  Peggyann Shoals, DO    Provider assisted by: Shanda Bumps LPN    Patient assisted by: self    How tolerated by patient: tolerated the procedure well with no complications    Post Procedural Pain Scale: 0 - No Hurt    Comments: none    Procedure:  After consent was obtained, using sterile technique the tendon and carpal tunnel was prepped. Local anesthetic used: 1% lidocaine. Kenalog 5 mg X2 and was then injected and the needle withdrawn.  The procedure was well tolerated.  The patient is asked to continue to rest the area for a few more days before resuming regular activities.  It may be more painful for the first 1-2 days.  Watch for fever, or increased swelling or persistent pain in the joint. Call or return to clinic prn if such symptoms occur or there is failure to improve as anticipated.

## 2018-04-02 NOTE — Progress Notes (Signed)
Chief Complaint   Patient presents with   ??? Hand Pain     Bil   ??? Elbow Pain     Right

## 2018-04-02 NOTE — Patient Instructions (Signed)
Trigger Finger: Care Instructions  Overview  A trigger finger is a finger stuck in a bent position. It happens when the tendon that bends and straightens the thumb or finger can't slide smoothly under the ligaments that hold the tendon against the bones. In most cases, it's caused by a bump (nodule) that forms on the tendon. The bent finger usually straightens out on its own.  A trigger finger can be painful. But it normally isn't a serious problem.  Trigger fingers seem to occur more in some groups of people. These groups include:  ?? People who have diabetes or arthritis.  ?? People who have injured their hands in the past.  ?? Musicians.  ?? People who grip tools often.  Rest and exercises may help your finger relax so that it can bend.  You may get a corticosteroid shot. This can reduce swelling and pain. Your doctor may put a splint on your finger. It will give your finger some rest. You may need surgery if the finger keeps locking in a bent position.  Follow-up care is a key part of your treatment and safety. Be sure to make and go to all appointments, and call your doctor if you are having problems. It's also a good idea to know your test results and keep a list of the medicines you take.  How can you care for yourself at home?  ?? If your doctor put a splint on your finger, wear it as directed. Don't take it off until your doctor says you can.  ?? You may need to change your activities to avoid movements that irritate the finger.  ?? Take your medicines exactly as prescribed. Call your doctor if you think you are having a problem with your medicine.  ?? Ask your doctor if you can take an over-the-counter pain medicine, such as acetaminophen (Tylenol), ibuprofen (Advil, Motrin), or naproxen (Aleve). Be safe with medicines. Read and follow all instructions on the label.  ?? If your doctor recommends exercises, do them as directed.  When should you call for help?   Call your doctor now or seek immediate medical care if:  ?? ?? Your finger locks in a bent position and will not straighten.   ??Watch closely for changes in your health, and be sure to contact your doctor if:  ?? ?? You do not get better as expected.   Where can you learn more?  Go to [employer].  Enter M826 in the search box to learn more about "Trigger Finger: Care Instructions."  Current as of: July 19, 2017  Content Version: 12.2  ?? 2006-2019 [employer]. Care instructions adapted under license by [employer] (which disclaims liability or warranty for this information). If you have questions about a medical condition or this instruction, always ask your healthcare professional. [employer] disclaims any warranty or liability for your use of this information.         Carpal Tunnel Syndrome: Care Instructions  Your Care Instructions    Carpal tunnel syndrome is a nerve problem. It can cause tingling, numbness, weakness, or pain in the fingers, thumb, and hand. The median nerve and several tough tissues called tendons run through a space in the wrist called the carpal tunnel. The repeated hand motions used in work and some hobbies and sports can put pressure on the nerve. Pregnancy and several conditions, including diabetes, arthritis, and an underactive thyroid, also can cause carpal tunnel syndrome.  You may be able to limit an  activity or do it differently to reduce your symptoms. You also can take other steps to feel better. If your symptoms are mild, 1 to 2 weeks of home treatment are likely to ease your pain. Surgery is needed only if other treatments do not work.  Follow-up care is a key part of your treatment and safety. Be sure to make and go to all appointments, and call your doctor if you are having problems. It's also a good idea to know your test results and keep a list of the medicines you take.  How can you care for yourself at home?   ?? If possible, stop or reduce the activity that causes your symptoms. If you cannot stop the activity, take frequent breaks to rest and stretch or change hand positions to do a task. Try switching hands, such as when using a computer mouse.  ?? Try to avoid bending or twisting your wrists.  ?? Ask your doctor if you can take an over-the-counter pain medicine, such as acetaminophen (Tylenol), ibuprofen (Advil, Motrin), or naproxen (Aleve). Be safe with medicines. Read and follow all instructions on the label.  ?? If your doctor prescribes corticosteroid medicine to help reduce pain and swelling, take it exactly as prescribed. Call your doctor if you think you are having a problem with your medicine.  ?? Put ice or a cold pack on your wrist for 10 to 20 minutes at a time to ease pain. Put a thin cloth between the ice and your skin.  ?? If your doctor or your physical or occupational therapist tells you to wear a wrist splint, wear it as directed to keep your wrist in a neutral position. This also eases pressure on your median nerve.  ?? Ask your doctor whether you should have physical or occupational therapy to learn how to do tasks differently.  ?? Try a yoga class to stretch your muscles and build strength in your hands and wrists. Yoga has been shown to ease carpal tunnel symptoms.  To prevent carpal tunnel  ?? When working at a computer, keep your hands and wrists in line with your forearms. Hold your elbows close to your sides. Take a break every 10 to 15 minutes.  ?? Try these exercises:  ? Warm up: Rotate your wrist up, down, and from side to side. Repeat this 4 times. Stretch your fingers far apart, relax them, then stretch them again. Repeat 4 times. Stretch your thumb by pulling it back gently, holding it, and then releasing it. Repeat 4 times.  ? Prayer stretch: Start with your palms together in front of your chest just below your chin. Slowly lower your hands toward your waistline while  keeping your hands close to your stomach and your palms together until you feel a mild to moderate stretch under your forearms. Hold for 10 to 20 seconds. Repeat 4 times.  ? Wrist flexor stretch: Hold your arm in front of you with your palm up. Bend your wrist, pointing your hand toward the floor. With your other hand, gently bend your wrist further until you feel a mild to moderate stretch in your forearm. Hold for 10 to 20 seconds. Repeat 4 times.  ? Wrist extensor stretch: Repeat the steps for the wrist flexor stretch, but begin with your extended hand palm down.  ?? Squeeze a rubber exercise ball several times a day to keep your hands and fingers strong.  ?? Avoid holding objects (such as a book) in one position for a  long time. When possible, use your whole hand to grasp an object. Using just the thumb and index finger can put stress on the wrist.  ?? Do not smoke. It can make this condition worse by reducing blood flow to the median nerve. If you need help quitting, talk to your doctor about stop-smoking programs and medicines. These can increase your chances of quitting for good.  When should you call for help?  Watch closely for changes in your health, and be sure to contact your doctor if:  ?? ?? Your pain or other problems do not get better with home care.   ?? ?? You want more information about physical or occupational therapy.   ?? ?? You have side effects of your corticosteroid medicine, such as:  ? Weight gain.  ? Mood changes.  ? Trouble sleeping.  ? Bruising easily.   ?? ?? You have any other problems with your medicine.   Where can you learn more?  Go to http://www.healthwise.net/[employer].  Enter R432 in the search box to learn more about "Carpal Tunnel Syndrome: Care Instructions."  Current as of: July 19, 2017  Content Version: 12.2  ?? 2006-2019 [employer]. Care instructions adapted under license by [employer] (which disclaims liability or warranty  for this information). If you have questions about a medical condition or this instruction, always ask your healthcare professional. [employer] disclaims any warranty or liability for your use of this information.

## 2018-04-05 NOTE — Telephone Encounter (Signed)
Again, attempted to contact patient, Reached unidentified voicemail, left message, identified myself/facility/callback number, requested return call to facility.    Closing encounter due to inability to reach patient.

## 2018-04-10 ENCOUNTER — Ambulatory Visit
Admit: 2018-04-10 | Discharge: 2018-04-10 | Payer: PRIVATE HEALTH INSURANCE | Attending: Family Medicine | Primary: Family Medicine

## 2018-04-10 ENCOUNTER — Ambulatory Visit: Attending: Family Medicine | Primary: Family Medicine

## 2018-04-10 DIAGNOSIS — M25561 Pain in right knee: Secondary | ICD-10-CM

## 2018-04-10 DIAGNOSIS — G8929 Other chronic pain: Secondary | ICD-10-CM

## 2018-04-10 NOTE — Progress Notes (Signed)
Progress Notes by Tobie Lords, MD at 04/10/18 1445                Author: Tobie Lords, MD  Service: --  Author Type: Physician       Filed: 04/13/18 1004  Encounter Date: 04/10/2018  Status: Signed          Editor: Tobie Lords, MD (Physician)                         [facility]  [address], [address]   [place], [place] [address]   Phone: [phone]   Fax: [phone]      Pt's date of birth: 07-24-1970      ASSESSMENT   Diagnoses and all orders for this visit:      1. Chronic pain of right knee   -     AMB POC XRAY, KNEE; 1/2 VIEWS      2. Left hip pain   -     REFERRAL TO ORTHOPEDICS   -     AMB POC X-RAY RADEX HIP UNI WITH PELVIS 2-3 VIEWS      3. Gait abnormality      4. Right leg weakness      5. Lumbar radiculopathy      6. Lumbar facet arthropathy      7. Obesity, morbid (HCC)      8. BMI 40.0-44.9, adult (HCC)      9. Tobacco use             IMPRESSION AND PLAN:   Jim Shaw is a 48 y.o. male with history of cervical pain. He complains of low back pain that radiates down his right leg into his feet. Pt notes that his right knee occasionally gives  out and he presents to the office today using a single point cane. Pt has been followed by Dr. Guido Sander regarding the numbness/tingling in his right hand.      1) Pt was given information on lumbar and knee arthritis exercises.    2) He was referred to Dr. Liliane Channel for evaluation of the right knee and left knee.   3) Mr. Milum has a reminder for  a "due or due soon" health maintenance. I have asked that he contact his  primary care provider, [name], MD, for follow-up on this health maintenance.   4) PMP demonstrated consistency with prescribing.    5) Weight loss recommended   6) Pt will follow up with pain management as directed   7) Discussed using Topamax but unclear whether he has glaucoma, will defer for now   8) Smoking cessation strongly recommended      Follow-up and Dispositions      ??  Return in about 6 weeks (around 05/22/2018) for follow up.                      HISTORY OF PRESENT ILLNESS:   Jim Shaw is a 48 y.o.  male with history of cervical and lumbar pain and presents to the office today for EMG follow up. He complains of low back pain that radiates  down his right leg into his feet. Pt also complains of more recent left hip pain. He notes that his right knee occasionally gives out and he presents to the office today using a single point cane. Pt has been followed by Dr. Guido Sander regarding  the numbness/tingling  in his hands. He notes that he received two injections in the hands and was prescribed wrist splints. Pt reports intermittent neck pain. Pt notes that he was prescribed a right knee support by his PCP,  [name], MD. He tapered off the Neurontin 600 mg and started Lyrica 75 mg 1 cap BID with minimal relief. Pt notes that he never received the prescription for Lyrica 100 mg and ran out  of Lyrica 75 mg since his last office visit. He denies ever taking Topamax and denies a history of kidney stones. Pt notes that he was told he had elevated eye pressure when he last followed up with his eye specialist. He reports that he is scheduled  to followed up with Dr. Sheron Nightingale on 04/16/2018 for pain management. Pt at this time desires to proceed with a referral to Dr. Liliane Channel for evaluation of the right knee.      Pt notes that over the years he has worked physically demanding jobs in [employer], as an Personnel officer, in Forensic scientist, and as a tow Naval architect.      Pain Scale: 8/10      PCP: [name], MD        Past Medical History:        Diagnosis  Date         ?  Costochondritis       ?  Major depressive disorder  11/08/2017         ?  Psychiatric disorder               Social History          Socioeconomic History         ?  Marital status:  SINGLE              Spouse name:  Not on file         ?  Number of  children:  Not on file     ?  Years of education:  Not on file     ?  Highest education level:  Not on file       Occupational History        ?  Not on file       Social Needs         ?  Financial resource strain:  Not on file        ?  Food insecurity              Worry:  Not on file         Inability:  Not on file        ?  Transportation needs              Medical:  Not on file              Non-medical:  Not on file       Tobacco Use         ?  Smoking status:  Current Every Day Smoker              Packs/day:  0.25         Years:  20.00         Pack years:  5.00         ?  Smokeless tobacco:  Never Used       Substance and Sexual Activity         ?  Alcohol use:  Yes             Comment: 5-6 shots/day         ?  Drug use:  Yes              Types:  Marijuana         ?  Sexual activity:  Not on file       Lifestyle        ?  Physical activity              Days per week:  Not on file         Minutes per session:  Not on file         ?  Stress:  Not on file       Relationships        ?  Social Engineer, manufacturing systems on phone:  Not on file         Gets together:  Not on file         Attends religious service:  Not on file         Active member of club or organization:  Not on file         Attends meetings of clubs or organizations:  Not on file         Relationship status:  Not on file        ?  Intimate partner violence              Fear of current or ex partner:  Not on file         Emotionally abused:  Not on file         Physically abused:  Not on file         Forced sexual activity:  Not on file        Other Topics  Concern         ?  Military Service  Not Asked     ?  Blood Transfusions  Not Asked     ?  Caffeine Concern  Not Asked     ?  Occupational Exposure  Not Asked     ?  Hobby Hazards  Not Asked     ?  Sleep Concern  Not Asked     ?  Stress Concern  Not Asked     ?  Weight Concern  Not Asked     ?  Special Diet  Not Asked     ?  Back Care  Not Asked     ?  Exercise  Not Asked     ?  Bike Helmet   Not Asked     ?  Seat Belt  Not Asked     ?  Self-Exams  Not Asked       Social History Narrative        ?  Not on file             Current Outpatient Medications          Medication  Sig  Dispense  Refill           ?  mirtazapine (REMERON) 15 mg tablet  TK 1 T PO HS               ?  triamcinolone acetonide (KENALOG) 0.1 % ointment           ?  fluticasone propionate (FLONASE) 50 mcg/actuation nasal spray  2 Sprays by Both Nostrils route daily.         ?  ciclopirox (PENLAC) 8 % solution  Apply  to affected area nightly.         ?  celecoxib (CELEBREX) 100 mg capsule  Take 1 Cap by mouth daily.         ?  tiZANidine (ZANAFLEX) 4 mg tablet  Take 1 Tab by mouth three (3) times daily.  90 Tab  1     ?  hydrOXYzine HCl (ATARAX) 50 mg tablet  Take 50 mg by mouth as needed for Anxiety.         ?  venlafaxine-SR (EFFEXOR-XR) 150 mg capsule  Take 1 Cap by mouth daily (with breakfast). Indications: Anxiousness associated with Depression  15 Cap  1     ?  QUEtiapine (SEROQUEL) 50 mg tablet  Take 1 Tab by mouth nightly. Indications: Additional Medications to Treat Depression (Patient taking differently: Take 200 mg by mouth nightly. Indications:  Additional Medications to Treat Depression)  1 Tab  0     ?  prazosin (MINIPRESS) 1 mg capsule  TK 1 C PO QHS         ?  pregabalin (LYRICA) 75 mg capsule  Take 1 Cap by mouth two (2) times a day. Max Daily Amount: 150 mg.  28 Cap  0     ?  pregabalin (LYRICA) 100 mg capsule  Take 1 Cap by mouth three (3) times daily. Max Daily Amount: 300 mg.  90 Cap  1           ?  gabapentin (NEURONTIN) 600 mg tablet  Take 600 mg by mouth three (3) times daily.                 Allergies        Allergen  Reactions         ?  Oxycodone-Acetaminophen  Anaphylaxis and Swelling     ?  Duloxetine Hcl  Rash     ?  Aspirin  Other (comments)     ?  Cortisone  Nausea and Vomiting             Also rash          ?  Ibuprofen  Rash              REVIEW OF SYSTEMS      Constitutional: Negative for fever, chills,  or weight change.    Respiratory: Negative for cough or shortness of breath.      Cardiovascular: Negative for chest pain or palpitations.   Gastrointestinal: Negative for acid reflux, change in bowel habits, or constipation.   Genitourinary: Negative for dysuria and flank pain.    Musculoskeletal: Positive for lumbar and right knee pain. Positive for right leg weakness.   Skin: Negative for rash.    Neurological: Negative for headaches, dizziness, or numbness.   Endo/Heme/Allergies: Negative for increased bruising.    Psychiatric/Behavioral: Negative for difficulty with sleep.        PHYSICAL EXAMINATION   Visit Vitals      BP  (!) 141/94 (BP 1 Location: Right arm, BP Patient Position: Sitting)     Pulse  61     Temp  98.1 ??F (36.7 ??C) (Oral)     Ht   (1.854 m)     Wt  302 lb 9.6 oz (137.3 kg)  SpO2  95%        BMI  39.92 kg/m??           Constitutional: Awake, alert, and in no acute distress.  Neurological: 1+ symmetrical DTRs in the upper extremities. 1+ symmetrical DTRs in the lower extremities. Sensation to light touch is intact. Negative Hoffman's sign bilaterally.   Skin: warm, dry, and intact.    Musculoskeletal: Mild pain with patellar compression test on the right. No ligamental instability. No pain with extension, axial loading, or forward flexion. No pain with internal or external rotation of  his hips. Negative straight leg raise bilaterally.      Patient ambulates with the assistance of a single point cane.                 Biceps   Triceps  Deltoids  Wrist Ext  Wrist Flex  Hand Intrin             Right  +4/5  +4/5  +4/5  +4/5  +4/5  +4/5             Left  +4/5  +4/5  +4/5  +4/5  +4/5  +4/5                  Hip Flex   Quads  Hamstrings  Ankle DF  EHL  Ankle PF             Right  +4/5  +4/5  +4/5  -4/5  +4/5  +4/5             Left  +4/5  +4/5  +4/5  +4/5  +4/5  +4/5        IMAGING:      Hip and knee x-rays from 04/10/2018 were personally reviewed with the patient and demonstrated:   Minimal  degenerative changes.        Cervical MRI from 10/13/2017 was personally reviewed with the patient and demonstrated:            Interface, Powers[facility] Rad Res - 10/13/2017  4:00 PM EDT  EXAM: MRI of cervical spine without contrast    CLINICAL INDICATION/HISTORY: M54.41:  Low back pain with right-sided sciatica    COMPARISON: None    TECHNIQUE: Multiplanar multi-sequential imaging of the cervical spine without contrast. T1W, T2W and STIR sequences were obtained in the sagittal plane. T2W and GRE sequences  were obtained in the axial plane.    FINDINGS:     Postoperative changes: None.    Alignment: Overall straightening of normal cervical lordosis. No focal listhesis.    Vertebral body heights: Preserved. No edema.  -Mild  intervertebral disc space narrowing at C5-6 and C4-5. Slight disc bulge at several levels.    Marrow signal: No edema.    Cord: Normal caliber. No syrinx or edema.    Cerebellar tonsils: No Chiari 1 malformation.      Correlation  of axial and sagittal data through the disc levels:    -C2-3: No significant canal or foraminal stenosis    -C3-4: Mild broad-based posterior disc bulge effaces fluid space and contributes to mild central canal narrowing to 10 mm. No significant  foraminal narrowing.    -C4-5: Minimal broad-based posterior disc bulge. No significant canal narrowing.   -Mild to moderate right foraminal stenosis due to asymmetric disc bulge and some uncovertebral spur.    -C5-6: Broad-based posterior  disc bulge effaces fluid space and may contact the left ventral cervical cord. No edema. Mild  central canal narrowing to 9.5 mm.   -There is mild left lateral recess narrowing and mild to borderline moderate left foraminal narrowing due to disc bulge  and uncovertebral spur.    -C6-7: No significant canal or foraminal stenosis.    -C7-T1: No significant canal or foraminal stenosis.      IMPRESSION  1. Straightened cervical lordosis. No compression deformity, edema or listhesis.     2.  Multilevel degenerative discogenic disease with mild degree of canal narrowing at several levels.  -Ventral cord contact and minimal distortion due to disc bulge C5-C6 as above.    3. Varying degrees of bilateral foraminal narrowing most  significant right C4-5 and left C5-6.    Signed By: Emilia Beck, MD on 10/13/2017 3:58 PM     ??   ??   ??   Lumbar MRI from 10/13/2017 was personally reviewed with the patient and demonstrated:            Interface, Powers[facility] Rad Res - 10/13/2017  4:37 PM EDT  EXAM: MR LUMBAR SPINE WITHOUT CONTRAST    CLINICAL INDICATION/HISTORY:  M54.41:  Low back pain with right-sided sciatica    COMPARISON: None    TECHNIQUE: Multiplanar multi-sequential imaging of the lumbar spine without contrast. T1W, T2W and STIR sequences were obtained in the sagittal plane. Stacked axial T2 and axial  T1W through the lower 3 disc levels.    FINDINGS:     Postoperative : None.    Vertebral alignment: Mild straightening of lumbar lordosis with trace retrolisthesis of L5. No spondylolysis.    Vertebral body heights: Normal.  Mild endplate concavity in L5 and L4.    Marrow signal: Normal.    Cord: Conus medullaris terminates at the L1-L2 level with normal signal.    Soft tissues: Normal.    Correlation of axial and sagittal data through the disc  levels:    -L1-2: No significant disc pathology. No spinal canal or foraminal stenosis.    -L2-3: No significant disc pathology. Mild facet and ligamentous hypertrophy with bilateral facet joint effusion. No central or foraminal stenosis     -L3-4: No significant disc pathology. Similarly, mild bilateral facet and ligamentous hypertrophy with small facet joint effusion. No central or foraminal stenosis.    -L4-5: Mild posterior disc bulge. No central stenosis. More severe facet and  ligamentous hypertrophy with mild facet inflammation. No significant foraminal stenosis    -L5-S1: Disc bulge most prominent. Still no severe central stenosis. Facet and ligamentous  hypertrophy with inflammation. Moderate bilateral foraminal stenosis.  Mild compression of bilateral exiting L5 nerve root suspected.    IMPRESSION  Posterior disc bulge and facet/ligamentum flavum hypertrophy most prominent at L5-S1. Moderate bilateral foraminal stenosis with suspected mild compression bilateral  exiting L5 nerve roots. Facet hypertrophy and inflammation throughout.    Signed By: Gretel Acre, MD on 10/13/2017 4:35 PM     ??      Right upper extremity EMG from 12/27/2008 was personally reviewed with the patient and demonstrated:   NCV & EMG Findings:   All nerve conduction studies (as indicated in the following tables) were within normal limits.     ??   All examined muscles (as indicated in the following table) showed no evidence of electrical instability.     ??   INTERPRETATION   This was a normal nerve conduction and EMG study showing there to be no signs of neuropathy, myopathy, or radiculopathy in the nerves and muscles tested.    ??  CLINICAL INTERPRETATION   There are no electrodiagnostic findings correlating with his symptoms.          Written by Robinette HainesAnn Kelly, [place], as dictated by Delrae Sawyersheresa Lafawn Lenoir, MD.   I, Dr. Delrae Sawyersheresa Alyan Hartline confirm that all documentation is accurate.

## 2018-04-10 NOTE — Patient Instructions (Signed)
Low Back Arthritis: Exercises  Introduction  Here are some examples of typical rehabilitation exercises for your condition. Start each exercise slowly. Ease off the exercise if you start to have pain.  Your doctor or physical therapist will tell you when you can start these exercises and which ones will work best for you.  When you are not being active, find a comfortable position for rest. Some people are comfortable on the floor or a medium-firm bed with a small pillow under their head and another under their knees. Some people prefer to lie on their side with a pillow between their knees. Don't stay in one position for too long.  Take short walks (10 to 20 minutes) every 2 to 3 hours. Avoid slopes, hills, and stairs until you feel better. Walk only distances you can manage without pain, especially leg pain.  How to do the exercises  Pelvic tilt   1. Lie on your back with your knees bent.  2. "Brace" your stomach???tighten your muscles by pulling in and imagining your belly button moving toward your spine.  3. Press your lower back into the floor. You should feel your hips and pelvis rock back.  4. Hold for 6 seconds while breathing smoothly.  5. Relax and allow your pelvis and hips to rock forward.  6. Repeat 8 to 12 times.    Back stretches   1. Get down on your hands and knees on the floor.  2. Relax your head and allow it to droop. Round your back up toward the ceiling until you feel a nice stretch in your upper, middle, and lower back. Hold this stretch for as long as it feels comfortable, or about 15 to 30 seconds.  3. Return to the starting position with a flat back while you are on your hands and knees.  4. Let your back sway by pressing your stomach toward the floor. Lift your buttocks toward the ceiling.  5. Hold this position for 15 to 30 seconds.  6. Repeat 2 to 4 times.    Follow-up care is a key part of your treatment and safety. Be sure to make  and go to all appointments, and call your doctor if you are having problems. It's also a good idea to know your test results and keep a list of the medicines you take.  Where can you learn more?  Go to http://www.healthwise.net/[facility]  Enter T094 in the search box to learn more about "Low Back Arthritis: Exercises."  Current as of: June 26, 2019Content Version: 12.4  ?? 2006-2020 [employer].  Care instructions adapted under license by [employer] (which disclaims liability or warranty for this information). If you have questions about a medical condition or this instruction, always ask your healthcare professional. [employer], [employer] disclaims any warranty or liability for your use of this information.           Knee Arthritis: Exercises  Introduction  Here are some examples of exercises for you to try. The exercises may be suggested for a condition or for rehabilitation. Start each exercise slowly. Ease off the exercises if you start to have pain.  You will be told when to start these exercises and which ones will work best for you.  How to do the exercises  Knee flexion with heel slide   1. Lie on your back with your knees bent.  2. Slide your heel back by bending your affected knee as far as you can. Then hook your other   foot around your ankle to help pull your heel even farther back.  3. Hold for about 6 seconds, then rest for up to 10 seconds.  4. Repeat 8 to 12 times.  5. Switch legs and repeat steps 1 through 4, even if only one knee is sore.    Quad sets   1. Sit with your affected leg straight and supported on the floor or a firm bed. Place a small, rolled-up towel under your knee. Your other leg should be bent, with that foot flat on the floor.  2. Tighten the thigh muscles of your affected leg by pressing the back of your knee down into the towel.  3. Hold for about 6 seconds, then rest for up to 10 seconds.  4. Repeat 8 to 12 times.   5. Switch legs and repeat steps 1 through 4, even if only one knee is sore.    Straight-leg raises to the front   1. Lie on your back with your good knee bent so that your foot rests flat on the floor. Your affected leg should be straight. Make sure that your low back has a normal curve. You should be able to slip your hand in between the floor and the small of your back, with your palm touching the floor and your back touching the back of your hand.  2. Tighten the thigh muscles in your affected leg by pressing the back of your knee flat down to the floor. Hold your knee straight.  3. Keeping the thigh muscles tight and your leg straight, lift your affected leg up so that your heel is about 12 inches off the floor. Hold for about 6 seconds, then lower slowly.  4. Relax for up to 10 seconds between repetitions.  5. Repeat 8 to 12 times.  6. Switch legs and repeat steps 1 through 5, even if only one knee is sore.    Active knee flexion   1. Lie on your stomach with your knees straight. If your kneecap is uncomfortable, roll up a washcloth and put it under your leg just above your kneecap.  2. Lift the foot of your affected leg by bending the knee so that you bring the foot up toward your buttock. If this motion hurts, try it without bending your knee quite as far. This may help you avoid any painful motion.  3. Slowly move your leg up and down.  4. Repeat 8 to 12 times.  5. Switch legs and repeat steps 1 through 4, even if only one knee is sore.    Quadriceps stretch (facedown)   1. Lie flat on your stomach, and rest your face on the floor.  2. Wrap a towel or belt strap around the lower part of your affected leg. Then use the towel or belt strap to slowly pull your heel toward your buttock until you feel a stretch.  3. Hold for about 15 to 30 seconds, then relax your leg against the towel or belt strap.  4. Repeat 2 to 4 times.  5. Switch legs and repeat steps 1 through 4, even if only one knee is sore.     Stationary exercise bike   1. If you do not have a stationary exercise bike at home, you can find one to ride at your local health club or community center.  2. Adjust the height of the bike seat so that your knee is slightly bent when your leg is extended downward. If your knee   hurts when the pedal reaches the top, you can raise the seat so that your knee does not bend as much.  3. Start slowly. At first, try to do 5 to 10 minutes of cycling with little to no resistance. Then increase your time and the resistance bit by bit until you can do 20 to 30 minutes without pain.  4. If you start to have pain, rest your knee until your pain gets back to the level that is normal for you. Or cycle for less time or with less effort.    Follow-up care is a key part of your treatment and safety. Be sure to make and go to all appointments, and call your doctor if you are having problems. It's also a good idea to know your test results and keep a list of the medicines you take.  Where can you learn more?  Go to http://www.healthwise.net/[facility]  Enter C159 in the search box to learn more about "Knee Arthritis: Exercises."  Current as of: June 26, 2019Content Version: 12.4  ?? 2006-2020 [employer].  Care instructions adapted under license by [employer] [facility] (which disclaims liability or warranty for this information). If you have questions about a medical condition or this instruction, always ask your healthcare professional. [employer] disclaims any warranty or liability for your use of this information.

## 2018-04-10 NOTE — Progress Notes (Signed)
[facility]  [address]  [place], [place] [address]  Phone: [phone]  Fax: [phone]    Pt's date of birth: 01/25/1971    ASSESSMENT   Diagnoses and all orders for this visit:    1. Chronic pain of right knee  -     AMB POC XRAY, KNEE; 1/2 VIEWS    2. Left hip pain  -     REFERRAL TO ORTHOPEDICS  -     AMB POC X-RAY RADEX HIP UNI WITH PELVIS 2-3 VIEWS    3. Gait abnormality    4. Right leg weakness    5. Lumbar radiculopathy    6. Lumbar facet arthropathy    7. Obesity, morbid (HCC)    8. BMI 40.0-44.9, adult (HCC)    9. Tobacco use         IMPRESSION AND PLAN:  Jim Shaw is a 48 y.o. male with history of cervical pain. He complains of low back pain that radiates down his right leg into his feet. Pt notes that his right knee occasionally gives out and he presents to the office today using a single point cane. Pt has been followed by Dr. Guido Sander regarding the numbness/tingling in his right hand.    1) Pt was given information on lumbar and knee arthritis exercises.   2) He was referred to Dr. Liliane Channel for evaluation of the right knee and left knee.  3) Mr. Smoak has a reminder for a "due or due soon" health maintenance. I have asked that he contact his primary care provider, [name], MD, for follow-up on this health maintenance.  4) PMP demonstrated consistency with prescribing.   5) Weight loss recommended  6) Pt will follow up with pain management as directed  7) Discussed using Topamax but unclear whether he has glaucoma, will defer for now  8) Smoking cessation strongly recommended  Follow-up and Dispositions    ?? Return in about 6 weeks (around 05/22/2018) for follow up.             HISTORY OF PRESENT ILLNESS:  Jim Shaw is a 48 y.o. male with history of cervical and lumbar pain and presents to the office today for EMG follow up. He  complains of low back pain that radiates down his right leg into his feet. Pt also complains of more recent left hip pain. He notes that his right knee occasionally gives out and he presents to the office today using a single point cane. Pt has been followed by Dr. Guido Sander regarding the numbness/tingling in his hands. He notes that he received two injections in the hands and was prescribed wrist splints. Pt reports intermittent neck pain. Pt notes that he was prescribed a right knee support by his PCP, [name], MD. He tapered off the Neurontin 600 mg and started Lyrica 75 mg 1 cap BID with minimal relief. Pt notes that he never received the prescription for Lyrica 100 mg and ran out of Lyrica 75 mg since his last office visit. He denies ever taking Topamax and denies a history of kidney stones. Pt notes that he was told he had elevated eye pressure when he last followed up with his eye specialist. He reports that he is scheduled to followed up with Dr. Sheron Nightingale on 04/16/2018 for pain management. Pt at this time desires to proceed with a referral to Dr. Liliane Channel for evaluation of the right knee.  Pt notes that over the years he has worked physically demanding jobs in [employer], as an Personnel officer, in Forensic scientist, and as a tow Naval architect.    Pain Scale: 8/10    PCP: [name], MD    Past Medical History:   Diagnosis Date   ??? Costochondritis    ??? Major depressive disorder 11/08/2017   ??? Psychiatric disorder         Social History     Socioeconomic History   ??? Marital status: SINGLE     Spouse name: Not on file   ??? Number of children: Not on file   ??? Years of education: Not on file   ??? Highest education level: Not on file   Occupational History   ??? Not on file   Social Needs   ??? Financial resource strain: Not on file   ??? Food insecurity     Worry: Not on file     Inability: Not on file   ??? Transportation needs     Medical: Not on file     Non-medical: Not on file   Tobacco Use    ??? Smoking status: Current Every Day Smoker     Packs/day: 0.25     Years: 20.00     Pack years: 5.00   ??? Smokeless tobacco: Never Used   Substance and Sexual Activity   ??? Alcohol use: Yes     Comment: 5-6 shots/day   ??? Drug use: Yes     Types: Marijuana   ??? Sexual activity: Not on file   Lifestyle   ??? Physical activity     Days per week: Not on file     Minutes per session: Not on file   ??? Stress: Not on file   Relationships   ??? Social Wellsite geologist on phone: Not on file     Gets together: Not on file     Attends religious service: Not on file     Active member of club or organization: Not on file     Attends meetings of clubs or organizations: Not on file     Relationship status: Not on file   ??? Intimate partner violence     Fear of current or ex partner: Not on file     Emotionally abused: Not on file     Physically abused: Not on file     Forced sexual activity: Not on file   Other Topics Concern   ??? Military Service Not Asked   ??? Blood Transfusions Not Asked   ??? Caffeine Concern Not Asked   ??? Occupational Exposure Not Asked   ??? Hobby Hazards Not Asked   ??? Sleep Concern Not Asked   ??? Stress Concern Not Asked   ??? Weight Concern Not Asked   ??? Special Diet Not Asked   ??? Back Care Not Asked   ??? Exercise Not Asked   ??? Bike Helmet Not Asked   ??? Seat Belt Not Asked   ??? Self-Exams Not Asked   Social History Narrative   ??? Not on file       Current Outpatient Medications   Medication Sig Dispense Refill   ??? mirtazapine (REMERON) 15 mg tablet TK 1 T PO HS     ??? triamcinolone acetonide (KENALOG) 0.1 % ointment      ??? fluticasone propionate (FLONASE) 50 mcg/actuation nasal spray 2 Sprays by Both Nostrils route daily.     ??? ciclopirox (PENLAC)  8 % solution Apply  to affected area nightly.     ??? celecoxib (CELEBREX) 100 mg capsule Take 1 Cap by mouth daily.     ??? tiZANidine (ZANAFLEX) 4 mg tablet Take 1 Tab by mouth three (3) times daily. 90 Tab 1    ??? hydrOXYzine HCl (ATARAX) 50 mg tablet Take 50 mg by mouth as needed for Anxiety.     ??? venlafaxine-SR (EFFEXOR-XR) 150 mg capsule Take 1 Cap by mouth daily (with breakfast). Indications: Anxiousness associated with Depression 15 Cap 1   ??? QUEtiapine (SEROQUEL) 50 mg tablet Take 1 Tab by mouth nightly. Indications: Additional Medications to Treat Depression (Patient taking differently: Take 200 mg by mouth nightly. Indications: Additional Medications to Treat Depression) 1 Tab 0   ??? prazosin (MINIPRESS) 1 mg capsule TK 1 C PO QHS     ??? pregabalin (LYRICA) 75 mg capsule Take 1 Cap by mouth two (2) times a day. Max Daily Amount: 150 mg. 28 Cap 0   ??? pregabalin (LYRICA) 100 mg capsule Take 1 Cap by mouth three (3) times daily. Max Daily Amount: 300 mg. 90 Cap 1   ??? gabapentin (NEURONTIN) 600 mg tablet Take 600 mg by mouth three (3) times daily.         Allergies   Allergen Reactions   ??? Oxycodone-Acetaminophen Anaphylaxis and Swelling   ??? Duloxetine Hcl Rash   ??? Aspirin Other (comments)   ??? Cortisone Nausea and Vomiting     Also rash    ??? Ibuprofen Rash         REVIEW OF SYSTEMS    Constitutional: Negative for fever, chills, or weight change.   Respiratory: Negative for cough or shortness of breath.     Cardiovascular: Negative for chest pain or palpitations.  Gastrointestinal: Negative for acid reflux, change in bowel habits, or constipation.  Genitourinary: Negative for dysuria and flank pain.   Musculoskeletal: Positive for lumbar and right knee pain. Positive for right leg weakness.  Skin: Negative for rash.   Neurological: Negative for headaches, dizziness, or numbness.  Endo/Heme/Allergies: Negative for increased bruising.   Psychiatric/Behavioral: Negative for difficulty with sleep.      PHYSICAL EXAMINATION  Visit Vitals  BP (!) 141/94 (BP 1 Location: Right arm, BP Patient Position: Sitting)   Pulse 61   Temp 98.1 ??F (36.7 ??C) (Oral)   Ht  (1.854 m)   Wt 302 lb 9.6 oz (137.3 kg)   SpO2 95%    BMI 39.92 kg/m??       Constitutional: Awake, alert, and in no acute distress.  Neurological: 1+ symmetrical DTRs in the upper extremities. 1+ symmetrical DTRs in the lower extremities. Sensation to light touch is intact. Negative Hoffman's sign bilaterally.  Skin: warm, dry, and intact.   Musculoskeletal: Mild pain with patellar compression test on the right. No ligamental instability. No pain with extension, axial loading, or forward flexion. No pain with internal or external rotation of his hips. Negative straight leg raise bilaterally.    Patient ambulates with the assistance of a single point cane.      Biceps  Triceps Deltoids Wrist Ext Wrist Flex Hand Intrin   Right +4/5 +4/5 +4/5 +4/5 +4/5 +4/5   Left +4/5 +4/5 +4/5 +4/5 +4/5 +4/5      Hip Flex  Quads Hamstrings Ankle DF EHL Ankle PF   Right +4/5 +4/5 +4/5 -4/5 +4/5 +4/5   Left +4/5 +4/5 +4/5 +4/5 +4/5 +4/5     IMAGING:  Hip and knee x-rays from 04/10/2018 were personally reviewed with the patient and demonstrated:  Minimal degenerative changes.      Cervical MRI from 10/13/2017 was personally reviewed with the patient and demonstrated:     Interface, [facility] Rad Res - 10/13/2017  4:00 PM EDT  EXAM: MRI of cervical spine without contrast    CLINICAL INDICATION/HISTORY: M54.41: Low back pain with right-sided sciatica    COMPARISON: None    TECHNIQUE: Multiplanar multi-sequential imaging of the cervical spine without contrast. T1W, T2W and STIR sequences were obtained in the sagittal plane. T2W and GRE sequences were obtained in the axial plane.    FINDINGS:     Postoperative changes: None.    Alignment: Overall straightening of normal cervical lordosis. No focal listhesis.    Vertebral body heights: Preserved. No edema.  -Mild intervertebral disc space narrowing at C5-6 and C4-5. Slight disc bulge at several levels.    Marrow signal: No edema.    Cord: Normal caliber. No syrinx or edema.    Cerebellar tonsils: No Chiari 1 malformation.       Correlation of axial and sagittal data through the disc levels:    -C2-3: No significant canal or foraminal stenosis    -C3-4: Mild broad-based posterior disc bulge effaces fluid space and contributes to mild central canal narrowing to 10 mm. No significant foraminal narrowing.    -C4-5: Minimal broad-based posterior disc bulge. No significant canal narrowing.   -Mild to moderate right foraminal stenosis due to asymmetric disc bulge and some uncovertebral spur.    -C5-6: Broad-based posterior disc bulge effaces fluid space and may contact the left ventral cervical cord. No edema. Mild central canal narrowing to 9.5 mm.   -There is mild left lateral recess narrowing and mild to borderline moderate left foraminal narrowing due to disc bulge and uncovertebral spur.    -C6-7: No significant canal or foraminal stenosis.    -C7-T1: No significant canal or foraminal stenosis.      IMPRESSION  1. Straightened cervical lordosis. No compression deformity, edema or listhesis.    2. Multilevel degenerative discogenic disease with mild degree of canal narrowing at several levels.  -Ventral cord contact and minimal distortion due to disc bulge C5-C6 as above.    3. Varying degrees of bilateral foraminal narrowing most significant right C4-5 and left C5-6.    Signed By: Emilia Beck, MD on 10/13/2017 3:58 PM   ??  ??  ??  Lumbar MRI from 10/13/2017 was personally reviewed with the patient and demonstrated:     Interface, [facility] Rad Res - 10/13/2017  4:37 PM EDT  EXAM: MR LUMBAR SPINE WITHOUT CONTRAST    CLINICAL INDICATION/HISTORY:  M54.41: Low back pain with right-sided sciatica    COMPARISON: None    TECHNIQUE: Multiplanar multi-sequential imaging of the lumbar spine without contrast. T1W, T2W and STIR sequences were obtained in the sagittal plane. Stacked axial T2 and axial T1W through the lower 3 disc levels.    FINDINGS:     Postoperative : None.    Vertebral alignment: Mild straightening of lumbar lordosis with trace  retrolisthesis of L5. No spondylolysis.    Vertebral body heights: Normal. Mild endplate concavity in L5 and L4.    Marrow signal: Normal.    Cord: Conus medullaris terminates at the L1-L2 level with normal signal.    Soft tissues: Normal.    Correlation of axial and sagittal data through the disc levels:    -L1-2: No significant disc pathology.  No spinal canal or foraminal stenosis.    -L2-3: No significant disc pathology. Mild facet and ligamentous hypertrophy with bilateral facet joint effusion. No central or foraminal stenosis    -L3-4: No significant disc pathology. Similarly, mild bilateral facet and ligamentous hypertrophy with small facet joint effusion. No central or foraminal stenosis.    -L4-5: Mild posterior disc bulge. No central stenosis. More severe facet and ligamentous hypertrophy with mild facet inflammation. No significant foraminal stenosis    -L5-S1: Disc bulge most prominent. Still no severe central stenosis. Facet and ligamentous hypertrophy with inflammation. Moderate bilateral foraminal stenosis. Mild compression of bilateral exiting L5 nerve root suspected.    IMPRESSION  Posterior disc bulge and facet/ligamentum flavum hypertrophy most prominent at L5-S1. Moderate bilateral foraminal stenosis with suspected mild compression bilateral exiting L5 nerve roots. Facet hypertrophy and inflammation throughout.    Signed By: Gretel Acre, MD on 10/13/2017 4:35 PM   ??    Right upper extremity EMG from 12/27/2008 was personally reviewed with the patient and demonstrated:  NCV & EMG Findings:  All nerve conduction studies (as indicated in the following tables) were within normal limits.    ??  All examined muscles (as indicated in the following table) showed no evidence of electrical instability.    ??  INTERPRETATION  This was a normal nerve conduction and EMG study showing there to be no signs of neuropathy, myopathy, or radiculopathy in the nerves and muscles tested.   ??  CLINICAL INTERPRETATION   There are no electrodiagnostic findings correlating with his symptoms.       Written by Robinette Haines, [place], as dictated by Delrae Sawyers, MD.  I, Dr. Delrae Sawyers confirm that all documentation is accurate.

## 2018-04-16 NOTE — Telephone Encounter (Signed)
The patient can hold on appt

## 2018-04-16 NOTE — Telephone Encounter (Signed)
Tried to call patient and received a generic message. I left a VM telling the patient to call us back. If he calls back we can let him know that his appointment is not urgent and his appointment will have to wait until the end of may.

## 2018-04-16 NOTE — Telephone Encounter (Signed)
PT WAS REF FOR [facility] PER DR Jean Rosenthal AND WANTS TO FIND OUT IF HE SHOULD MAKE APPT DUE TO CORONAVIRUS PLEASE CALL PT IF HE CAN BE SCHEDULE THIS WEEK

## 2018-05-07 ENCOUNTER — Encounter: Attending: Family Medicine | Primary: Family Medicine

## 2018-05-23 ENCOUNTER — Encounter: Attending: Family Medicine | Primary: Family Medicine

## 2018-05-23 ENCOUNTER — Telehealth
Admit: 2018-05-23 | Discharge: 2018-05-23 | Payer: PRIVATE HEALTH INSURANCE | Attending: Family Medicine | Primary: Family Medicine

## 2018-05-23 ENCOUNTER — Telehealth: Attending: Family Medicine | Primary: Family Medicine

## 2018-05-23 DIAGNOSIS — M5416 Radiculopathy, lumbar region: Secondary | ICD-10-CM

## 2018-05-23 MED ORDER — METHYLPREDNISOLONE 4 MG TABS IN A DOSE PACK
4 mg | ORAL | 0 refills | Status: DC
Start: 2018-05-23 — End: 2018-07-11

## 2018-05-23 MED ORDER — PREGABALIN 75 MG CAP
75 mg | ORAL_CAPSULE | ORAL | 1 refills | Status: DC
Start: 2018-05-23 — End: 2018-05-28

## 2018-05-23 MED ORDER — CELECOXIB 200 MG CAP
200 mg | ORAL_CAPSULE | Freq: Two times a day (BID) | ORAL | 2 refills | Status: DC
Start: 2018-05-23 — End: 2018-08-22

## 2018-05-23 MED ORDER — TIZANIDINE 4 MG TAB
4 mg | ORAL_TABLET | ORAL | 1 refills | Status: AC
Start: 2018-05-23 — End: ?

## 2018-05-23 NOTE — Progress Notes (Signed)
Progress Notes by Tobie Lords, MD at 05/23/18 0845                Author: Tobie Lords, MD  Service: --  Author Type: Physician       Filed: 05/23/18 1728  Encounter Date: 05/23/2018  Status: Signed          Editor: Tobie Lords, MD (Physician)                         [facility]  [address]   [place], [place] [address]   Phone: [phone]   Fax: [phone]      Pt's date of birth: 11-02-1970      ASSESSMENT   Diagnoses and all orders for this visit:      1. Lumbar radiculopathy   -     pregabalin (Lyrica) 75 mg capsule; Take 1-2 caps by mouth two (2) times daily as directed. Max Daily Amount: 300 mg.      2. Lumbar facet arthropathy   -     methylPREDNISolone (MEDROL DOSEPACK) 4 mg tablet; Per dose pack instructions   -     celecoxib (CeleBREX) 200 mg capsule; Take 1 Cap by mouth two (2) times daily (with meals).      3. Muscle spasm   -     tiZANidine (ZANAFLEX) 4 mg tablet; Take 1 tab by mouth BID-TID as needed for muscle spasm.      4. Obesity, morbid (HCC)      5. Tobacco use             IMPRESSION AND PLAN:   Jim Shaw is a 48 y.o. male with history of cervical and lumbar pain. He complains of pain in the lower back that radiates down the right leg to the foot, unchanged  since his last office visit. Pt admits to progressively worsening right knee pain/giving way but notes that he was unable to follow up with Dr. Liliane Channel due to COVID-19 precautions. He also complains of intermittent neck pain. He notes minimal relief  when taking Neurontin 600 mg, Lyrica 75 mg BID, and Celebrex 100 mg.      1) Pt was given information on cervical and lumbar arthritis exercises.    2) He was prescribed a Medrol Dosepak.   3) Pt will restart Celebrex 200 mg 1 tab BID.   4) He will also restart Lyrica 75 mg 1-2 tabs BID as directed. Discussed increasing this further if needed.    5) Pt was prescribed Zanaflex 4 mg 1 tab BID-TID  prn muscle spasm.   6) I strongly encouraged the patient to quit smoking.   7) [name] has a reminder for  a "due or due soon" health maintenance. I have asked that he contact his  primary care provider, Jim Decree, MD, for follow-up on this health maintenance.   8) PMP demonstrated consistency with prescribing.      Follow-up and Dispositions      ??  Return in about 3 weeks (around 06/13/2018) for Medication follow up.                CPT Codes [phone] for Established Patients may apply to this TeleHealth Visit   Time-based coding, delete if not needed: I spent 29 minutes 8:55 AM to 9:24 AM with this established patient, and >50% of the time was spent counseling and/or coordinating care  regarding his symptoms and medications.       Due to this being a TeleHealth evaluation, many elements of the physical examination are unable to be assessed.       Pursuant to the emergency declaration under the [facility] Act and the [employer] Emergencies Act, 1135 waiver authority and the [employer]onavirus Preparedness and [employer] Supplemental Appropriations Act, this Virtual Visit was conducted, with patient's consent,  to reduce the patient's risk of exposure to COVID-19 and provide continuity of care for an established patient.       Services were provided through a telephone discussion virtually to substitute for in-person clinic visit.      HISTORY OF PRESENT ILLNESS:   Jim Shaw is a 48 y.o.  male with history of cervical and lumbar pain and was evaluated from his  home by telephone at the [facility] location on 05/23/2018 with his  verbal consent for follow up. He complains of pain in the lower back that radiates down the right leg to the foot, unchanged since his last office visit. Pt admits to progressively worsening right knee pain/giving way but notes that he was unable to  follow up with Jim Shaw due to COVID-19 precautions. He also complains of intermittent neck pain. Pt has been followed  by Jim Shaw regarding numbness/tingling in his hands. He notes that he received two injections in the hands and was prescribed  wrist splints. Pt reports temporary relief with aquatic physical therapy. He notes minimal relief when taking Neurontin 600 mg, Lyrica 75 mg BID, and Celebrex 100 mg but denies any side effects with the medications. Pt has tolerated prednisone in the  past and denies a history of diabetes mellitus. He reports that he was previously told he had elevated pressure in his eyes a couple months ago. Pt admits to about a 50 lb weight gain when taking Seroquel. He denies taking muscle relaxants and notes that  he has taken trazodone. Pt states that he was diagnosed with severe apnea a couple days ago but he is unable to obtain a CPAP machine at this time due to COVID-19. Pt at this time desires to proceed with medication evaluation.      Pain Scale: 8/10      PCP: Jim DecreeZamani, Tayari R, MD         Past Medical History:        Diagnosis  Date         ?  Costochondritis       ?  Major depressive disorder  11/08/2017         ?  Psychiatric disorder               Social History          Socioeconomic History         ?  Marital status:  SINGLE              Spouse name:  Not on file         ?  Number of children:  Not on file     ?  Years of education:  Not on file     ?  Highest education level:  Not on file       Occupational History        ?  Not on file       Social Needs         ?  Financial resource strain:  Not on file        ?  Food insecurity              Worry:  Not on file         Inability:  Not on file        ?  Transportation needs              Medical:  Not on file         Non-medical:  Not on file       Tobacco Use         ?  Smoking status:  Current Every Day Smoker              Packs/day:  0.25         Years:  20.00         Pack years:  5.00         ?  Smokeless tobacco:  Never Used       Substance and Sexual Activity         ?  Alcohol use:  Yes             Comment: 5-6 shots/day          ?  Drug use:  Yes              Types:  Marijuana         ?  Sexual activity:  Not on file       Lifestyle        ?  Physical activity              Days per week:  Not on file         Minutes per session:  Not on file         ?  Stress:  Not on file       Relationships        ?  Social Engineer, manufacturing systems on phone:  Not on file         Gets together:  Not on file         Attends religious service:  Not on file         Active member of club or organization:  Not on file         Attends meetings of clubs or organizations:  Not on file         Relationship status:  Not on file        ?  Intimate partner violence              Fear of current or ex partner:  Not on file         Emotionally abused:  Not on file         Physically abused:  Not on file         Forced sexual activity:  Not on file        Other Topics  Concern         ?  Military Service  Not Asked     ?  Blood Transfusions  Not Asked     ?  Caffeine Concern  Not Asked     ?  Occupational Exposure  Not Asked     ?  Hobby Hazards  Not Asked     ?  Sleep Concern  Not Asked     ?  Stress Concern  Not Asked     ?  Weight Concern  Not Asked     ?  Special Diet  Not Asked     ?  Back Care  Not Asked     ?  Exercise  Not Asked     ?  Bike Helmet  Not Asked     ?  Seat Belt  Not Asked     ?  Self-Exams  Not Asked       Social History Narrative        ?  Not on file             Current Outpatient Medications          Medication  Sig  Dispense  Refill           ?  diazePAM (VALIUM) 10 mg tablet  TK 1 T PO  QHS         ?  lamoTRIgine (LaMICtal) 25 mg tablet  TK 1 T PO NIGHTLY FOR 2 WKS THEN 2 TS NIGHTLY         ?  ramelteon (ROZEREM) 8 mg tablet  TK 1 T PO HS         ?  risperiDONE (RisperDAL) 1 mg tablet  TK 2 TS PO HS         ?  methylPREDNISolone (MEDROL DOSEPACK) 4 mg tablet  Per dose pack instructions  1 Dose Pack  0     ?  celecoxib (CeleBREX) 200 mg capsule  Take 1 Cap by mouth two (2) times daily (with meals).  60 Cap  2     ?  pregabalin  (Lyrica) 75 mg capsule  Take 1-2 caps by mouth two (2) times daily as directed. Max Daily Amount: 300 mg.  120 Cap  1     ?  tiZANidine (ZANAFLEX) 4 mg tablet  Take 1 tab by mouth BID-TID as needed for muscle spasm.  60 Tab  1     ?  triamcinolone acetonide (KENALOG) 0.1 % ointment           ?  fluticasone propionate (FLONASE) 50 mcg/actuation nasal spray  2 Sprays by Both Nostrils route daily.         ?  ciclopirox (PENLAC) 8 % solution  Apply  to affected area nightly.         ?  hydrOXYzine HCl (ATARAX) 50 mg tablet  Take 50 mg by mouth as needed for Anxiety.         ?  venlafaxine-SR (EFFEXOR-XR) 150 mg capsule  Take 1 Cap by mouth daily (with breakfast). Indications: Anxiousness associated with Depression  15 Cap  1     ?  prazosin (MINIPRESS) 1 mg capsule  TK 1 C PO QHS         ?  mirtazapine (REMERON) 15 mg tablet  TK 1 T PO HS         ?  celecoxib (CELEBREX) 100 mg capsule  Take 1 Cap by mouth daily.         ?  tiZANidine (ZANAFLEX) 4 mg tablet  Take 1 Tab by mouth three (3) times daily. (Patient not taking: Reported on 05/23/2018)  90 Tab  1     ?  pregabalin (LYRICA) 75 mg capsule  Take 1 Cap by mouth two (2) times a day. Max Daily Amount: 150 mg. (Patient not taking: Reported on 05/23/2018)  28 Cap  0     ?  pregabalin (LYRICA) 100 mg capsule  Take  1 Cap by mouth three (3) times daily. Max Daily Amount: 300 mg. (Patient not taking: Reported on 05/23/2018)  90 Cap  1     ?  gabapentin (NEURONTIN) 600 mg tablet  Take 600 mg by mouth three (3) times daily.               ?  QUEtiapine (SEROQUEL) 50 mg tablet  Take 1 Tab by mouth nightly. Indications: Additional Medications to Treat Depression (Patient not taking: Reported on 05/23/2018)  1 Tab  0             Allergies        Allergen  Reactions         ?  Oxycodone-Acetaminophen  Anaphylaxis and Swelling     ?  Duloxetine Hcl  Rash     ?  Aspirin  Other (comments)     ?  Cortisone  Nausea and Vomiting             Also rash          ?  Ibuprofen  Rash               REVIEW OF SYSTEMS      Constitutional: Negative for fever, chills, or weight change.    Gastrointestinal: Negative for acid reflux, change in bowel habits, or constipation.    Musculoskeletal: Positive for cervical, knee and lumbar pain.   Neurological: Negative for headaches or dizziness. Positive for numbness in the hands.    Psychiatric/Behavioral: Positive for difficulty with sleep.     As per HPI      IMAGING:      Hip and knee x-rays from 04/10/2018 were personally reviewed with the patient and demonstrated:   Minimal degenerative changes.        Cervical MRI from 10/13/2017 was personally reviewed with the patient and demonstrated:     ??       Interface, [facility] Rad Res - 10/13/2017 ??4:00 PM EDT  EXAM: MRI of cervical spine without contrast    CLINICAL INDICATION/HISTORY:  M54.41: Low back pain with right-sided sciatica    COMPARISON: None    TECHNIQUE: Multiplanar multi-sequential imaging of the cervical spine without contrast. T1W, T2W and STIR sequences were obtained in the sagittal plane. T2W and GRE sequences  were obtained in the axial plane.    FINDINGS:     Postoperative changes: None.    Alignment: Overall straightening of normal cervical lordosis. No focal listhesis.    Vertebral body heights: Preserved. No edema.  -Mild  intervertebral disc space narrowing at C5-6 and C4-5. Slight disc bulge at several levels.    Marrow signal: No edema.    Cord: Normal caliber. No syrinx or edema.    Cerebellar tonsils: No Chiari 1 malformation.      Correlation  of axial and sagittal data through the disc levels:    -C2-3: No significant canal or foraminal stenosis    -C3-4: Mild broad-based posterior disc bulge effaces fluid space and contributes to mild central canal narrowing to 10 mm. No significant  foraminal narrowing.    -C4-5: Minimal broad-based posterior disc bulge. No significant canal narrowing.   -Mild to moderate right foraminal stenosis due to asymmetric disc bulge and some uncovertebral  spur.    -C5-6: Broad-based posterior  disc bulge effaces fluid space and may contact the left ventral cervical cord. No edema. Mild central canal narrowing to 9.5 mm.   -There is mild left lateral recess narrowing  and mild to borderline moderate left foraminal narrowing due to disc bulge  and uncovertebral spur.    -C6-7: No significant canal or foraminal stenosis.    -C7-T1: No significant canal or foraminal stenosis.      IMPRESSION  1. Straightened cervical lordosis. No compression deformity, edema or listhesis.     2. Multilevel degenerative discogenic disease with mild degree of canal narrowing at several levels.  -Ventral cord contact and minimal distortion due to disc bulge C5-C6 as above.    3. Varying degrees of bilateral foraminal narrowing most  significant right C4-5 and left C5-6.    Signed By: Emilia Beck, MD on 10/13/2017 3:58 PM     ??   ??   ??   Lumbar MRI from 10/13/2017 was personally reviewed with the patient and demonstrated:     ??       Interface, [facility] Rad Res - 10/13/2017 ??4:37 PM EDT  EXAM: MR LUMBAR SPINE WITHOUT CONTRAST    CLINICAL INDICATION/HISTORY: ??M54.41:  Low back pain with right-sided sciatica    COMPARISON: None    TECHNIQUE: Multiplanar multi-sequential imaging of the lumbar spine without contrast. T1W, T2W and STIR sequences were obtained in the sagittal plane. Stacked axial T2 and axial  T1W through the lower 3 disc levels.    FINDINGS:     Postoperative : None.    Vertebral alignment: Mild straightening of lumbar lordosis with trace retrolisthesis of L5. No spondylolysis.    Vertebral body heights: Normal.  Mild endplate concavity in L5 and L4.    Marrow signal: Normal.    Cord: Conus medullaris terminates at the L1-L2 level with normal signal.    Soft tissues: Normal.    Correlation of axial and sagittal data through the disc  levels:    -L1-2: No significant disc pathology. No spinal canal or foraminal stenosis.    -L2-3: No significant disc pathology. Mild facet and  ligamentous hypertrophy with bilateral facet joint effusion. No central or foraminal stenosis     -L3-4: No significant disc pathology. Similarly, mild bilateral facet and ligamentous hypertrophy with small facet joint effusion. No central or foraminal stenosis.    -L4-5: Mild posterior disc bulge. No central stenosis. More severe facet and  ligamentous hypertrophy with mild facet inflammation. No significant foraminal stenosis    -L5-S1: Disc bulge most prominent. Still no severe central stenosis. Facet and ligamentous hypertrophy with inflammation. Moderate bilateral foraminal stenosis.  Mild compression of bilateral exiting L5 nerve root suspected.    IMPRESSION  Posterior disc bulge and facet/ligamentum flavum hypertrophy most prominent at L5-S1. Moderate bilateral foraminal stenosis with suspected mild compression bilateral  exiting L5 nerve roots. Facet hypertrophy and inflammation throughout.    Signed By: Gretel Acre, MD on 10/13/2017 4:35 PM     ??   ??   Right upper extremity EMG from 12/27/2008 was personally reviewed with the patient and demonstrated:   NCV & EMG Findings:   All nerve conduction studies (as indicated in the following tables) were within normal limits. ??   ??   All examined muscles (as indicated in the following table) showed no evidence of electrical instability.????   ??   INTERPRETATION   This was a normal nerve conduction and EMG study showing there to be no signs of neuropathy, myopathy, or radiculopathy in the nerves and muscles tested.??   ??   CLINICAL INTERPRETATION   There are no electrodiagnostic findings correlating with his symptoms.??   ??  Written by Sebastian Ache, [place], as dictated by Delorise Araeya Lamb, MD.   I, Dr. Delorise Anisten Tomassi confirm that all documentation is accurate.

## 2018-05-23 NOTE — Progress Notes (Signed)
[facility]  [address], [address]  [place], [place] [address]  Phone: [phone]  Fax: [phone]    Pt's date of birth: 02-27-1970    ASSESSMENT   Diagnoses and all orders for this visit:    1. Lumbar radiculopathy  -     pregabalin (Lyrica) 75 mg capsule; Take 1-2 caps by mouth two (2) times daily as directed. Max Daily Amount: 300 mg.    2. Lumbar facet arthropathy  -     methylPREDNISolone (MEDROL DOSEPACK) 4 mg tablet; Per dose pack instructions  -     celecoxib (CeleBREX) 200 mg capsule; Take 1 Cap by mouth two (2) times daily (with meals).    3. Muscle spasm  -     tiZANidine (ZANAFLEX) 4 mg tablet; Take 1 tab by mouth BID-TID as needed for muscle spasm.    4. Obesity, morbid (HCC)    5. Tobacco use         IMPRESSION AND PLAN:  Jim Shaw is a 48 y.o. male with history of cervical and lumbar pain. He complains of pain in the lower back that radiates down the right leg to the foot, unchanged since his last office visit. Pt admits to progressively worsening right knee pain/giving way but notes that he was unable to follow up with Dr. Liliane Channel due to COVID-19 precautions. He also complains of intermittent neck pain. He notes minimal relief when taking Neurontin 600 mg, Lyrica 75 mg BID, and Celebrex 100 mg.    1) Pt was given information on cervical and lumbar arthritis exercises.   2) He was prescribed a Medrol Dosepak.  3) Pt will restart Celebrex 200 mg 1 tab BID.  4) He will also restart Lyrica 75 mg 1-2 tabs BID as directed. Discussed increasing this further if needed.   5) Pt was prescribed Zanaflex 4 mg 1 tab BID-TID prn muscle spasm.  6) I strongly encouraged the patient to quit smoking.  7) Mr. Fawcett has a reminder for a "due or due soon" health maintenance. I have asked that he contact his primary care provider, [name], MD, for follow-up on this health maintenance.  8) PMP demonstrated consistency with prescribing.    Follow-up and Dispositions    ?? Return in about 3 weeks (around 06/13/2018) for Medication follow up.         CPT Codes [id number] for Established Patients may apply to this TeleHealth Visit  Time-based coding, delete if not needed: I spent 29 minutes 8:55 AM to 9:24 AM with this established patient, and >50% of the time was spent counseling and/or coordinating care regarding his symptoms and medications.     Due to this being a TeleHealth evaluation, many elements of the physical examination are unable to be assessed.     Pursuant to the emergency declaration under the [facility] Act and the [employer], 1135 waiver authority and the [employer] Act, this Virtual Visit was conducted, with patient's consent, to reduce the patient's risk of exposure to COVID-19 and provide continuity of care for an established patient.     Services were provided through a telephone discussion virtually to substitute for in-person clinic visit.    HISTORY OF PRESENT ILLNESS:  Jim Shaw is a 48 y.o. male with history of cervical and lumbar pain and was evaluated from his home by telephone at the [facility] location on 05/23/2018 with his verbal consent for follow  up. He complains of pain in the lower back that radiates down the right leg to the foot, unchanged since his last office visit. Pt admits to progressively worsening right knee pain/giving way but notes that he was unable to follow up with Dr. Liliane ChannelBlasdell due to COVID-19 precautions. He also complains of intermittent neck pain. Pt has been followed by Dr. Guido SanderHuttman regarding numbness/tingling in his hands. He notes that he received two injections in the hands and was prescribed wrist splints. Pt reports temporary relief with aquatic physical therapy. He notes minimal relief when taking Neurontin 600 mg, Lyrica 75 mg BID, and Celebrex 100 mg but denies any  side effects with the medications. Pt has tolerated prednisone in the past and denies a history of diabetes mellitus. He reports that he was previously told he had elevated pressure in his eyes a couple months ago. Pt admits to about a 50 lb weight gain when taking Seroquel. He denies taking muscle relaxants and notes that he has taken trazodone. Pt states that he was diagnosed with severe apnea a couple days ago but he is unable to obtain a CPAP machine at this time due to COVID-19. Pt at this time desires to proceed with medication evaluation.    Pain Scale: 8/10    PCP: Eliane DecreeZamani, Quatavious R, MD     Past Medical History:   Diagnosis Date   ??? Costochondritis    ??? Major depressive disorder 11/08/2017   ??? Psychiatric disorder         Social History     Socioeconomic History   ??? Marital status: SINGLE     Spouse name: Not on file   ??? Number of children: Not on file   ??? Years of education: Not on file   ??? Highest education level: Not on file   Occupational History   ??? Not on file   Social Needs   ??? Financial resource strain: Not on file   ??? Food insecurity     Worry: Not on file     Inability: Not on file   ??? Transportation needs     Medical: Not on file     Non-medical: Not on file   Tobacco Use   ??? Smoking status: Current Every Day Smoker     Packs/day: 0.25     Years: 20.00     Pack years: 5.00   ??? Smokeless tobacco: Never Used   Substance and Sexual Activity   ??? Alcohol use: Yes     Comment: 5-6 shots/day   ??? Drug use: Yes     Types: Marijuana   ??? Sexual activity: Not on file   Lifestyle   ??? Physical activity     Days per week: Not on file     Minutes per session: Not on file   ??? Stress: Not on file   Relationships   ??? Social [employer] ge[employer]     Talks on phone: Not on file     Gets together: Not on file     Attends religious service: Not on file     Active member of club or organization: Not on file     Attends meetings of clubs or organizations: Not on file     Relationship status: Not on file    ??? Intimate partner violence     Fear of current or ex partner: Not on file     Emotionally abused: Not on file     Physically abused: Not on file  Forced sexual activity: Not on file   Other Topics Concern   ??? Military Service Not Asked   ??? Blood Transfusions Not Asked   ??? Caffeine Concern Not Asked   ??? Occupational Exposure Not Asked   ??? Hobby Hazards Not Asked   ??? Sleep Concern Not Asked   ??? Stress Concern Not Asked   ??? Weight Concern Not Asked   ??? Special Diet Not Asked   ??? Back Care Not Asked   ??? Exercise Not Asked   ??? Bike Helmet Not Asked   ??? Seat Belt Not Asked   ??? Self-Exams Not Asked   Social History Narrative   ??? Not on file       Current Outpatient Medications   Medication Sig Dispense Refill   ??? diazePAM (VALIUM) 10 mg tablet TK 1 T PO  QHS     ??? lamoTRIgine (LaMICtal) 25 mg tablet TK 1 T PO NIGHTLY FOR 2 WKS THEN 2 TS NIGHTLY     ??? ramelteon (ROZEREM) 8 mg tablet TK 1 T PO HS     ??? risperiDONE (RisperDAL) 1 mg tablet TK 2 TS PO HS     ??? methylPREDNISolone (MEDROL DOSEPACK) 4 mg tablet Per dose pack instructions 1 Dose Pack 0   ??? celecoxib (CeleBREX) 200 mg capsule Take 1 Cap by mouth two (2) times daily (with meals). 60 Cap 2   ??? pregabalin (Lyrica) 75 mg capsule Take 1-2 caps by mouth two (2) times daily as directed. Max Daily Amount: 300 mg. 120 Cap 1   ??? tiZANidine (ZANAFLEX) 4 mg tablet Take 1 tab by mouth BID-TID as needed for muscle spasm. 60 Tab 1   ??? triamcinolone acetonide (KENALOG) 0.1 % ointment      ??? fluticasone propionate (FLONASE) 50 mcg/actuation nasal spray 2 Sprays by Both Nostrils route daily.     ??? ciclopirox (PENLAC) 8 % solution Apply  to affected area nightly.     ??? hydrOXYzine HCl (ATARAX) 50 mg tablet Take 50 mg by mouth as needed for Anxiety.     ??? venlafaxine-SR (EFFEXOR-XR) 150 mg capsule Take 1 Cap by mouth daily (with breakfast). Indications: Anxiousness associated with Depression 15 Cap 1   ??? prazosin (MINIPRESS) 1 mg capsule TK 1 C PO QHS      ??? mirtazapine (REMERON) 15 mg tablet TK 1 T PO HS     ??? celecoxib (CELEBREX) 100 mg capsule Take 1 Cap by mouth daily.     ??? tiZANidine (ZANAFLEX) 4 mg tablet Take 1 Tab by mouth three (3) times daily. (Patient not taking: Reported on 05/23/2018) 90 Tab 1   ??? pregabalin (LYRICA) 75 mg capsule Take 1 Cap by mouth two (2) times a day. Max Daily Amount: 150 mg. (Patient not taking: Reported on 05/23/2018) 28 Cap 0   ??? pregabalin (LYRICA) 100 mg capsule Take 1 Cap by mouth three (3) times daily. Max Daily Amount: 300 mg. (Patient not taking: Reported on 05/23/2018) 90 Cap 1   ??? gabapentin (NEURONTIN) 600 mg tablet Take 600 mg by mouth three (3) times daily.     ??? QUEtiapine (SEROQUEL) 50 mg tablet Take 1 Tab by mouth nightly. Indications: Additional Medications to Treat Depression (Patient not taking: Reported on 05/23/2018) 1 Tab 0       Allergies   Allergen Reactions   ??? Oxycodone-Acetaminophen Anaphylaxis and Swelling   ??? Duloxetine Hcl Rash   ??? Aspirin Other (comments)   ??? Cortisone Nausea and  Vomiting     Also rash    ??? Ibuprofen Rash         REVIEW OF SYSTEMS    Constitutional: Negative for fever, chills, or weight change.   Gastrointestinal: Negative for acid reflux, change in bowel habits, or constipation.   Musculoskeletal: Positive for cervical, knee and lumbar pain.  Neurological: Negative for headaches or dizziness. Positive for numbness in the hands.   Psychiatric/Behavioral: Positive for difficulty with sleep.    As per HPI    IMAGING:    Hip and knee x-rays from 04/10/2018 were personally reviewed with the patient and demonstrated:  Minimal degenerative changes.      Cervical MRI from 10/13/2017 was personally reviewed with the patient and demonstrated:  ??   Interface, Powers[facility] Rad Res - 10/13/2017 ??4:00 PM EDT  EXAM: MRI of cervical spine without contrast    CLINICAL INDICATION/HISTORY: M54.41: Low back pain with right-sided sciatica    COMPARISON: None     TECHNIQUE: Multiplanar multi-sequential imaging of the cervical spine without contrast. T1W, T2W and STIR sequences were obtained in the sagittal plane. T2W and GRE sequences were obtained in the axial plane.    FINDINGS:     Postoperative changes: None.    Alignment: Overall straightening of normal cervical lordosis. No focal listhesis.    Vertebral body heights: Preserved. No edema.  -Mild intervertebral disc space narrowing at C5-6 and C4-5. Slight disc bulge at several levels.    Marrow signal: No edema.    Cord: Normal caliber. No syrinx or edema.    Cerebellar tonsils: No Chiari 1 malformation.      Correlation of axial and sagittal data through the disc levels:    -C2-3: No significant canal or foraminal stenosis    -C3-4: Mild broad-based posterior disc bulge effaces fluid space and contributes to mild central canal narrowing to 10 mm. No significant foraminal narrowing.    -C4-5: Minimal broad-based posterior disc bulge. No significant canal narrowing.   -Mild to moderate right foraminal stenosis due to asymmetric disc bulge and some uncovertebral spur.    -C5-6: Broad-based posterior disc bulge effaces fluid space and may contact the left ventral cervical cord. No edema. Mild central canal narrowing to 9.5 mm.   -There is mild left lateral recess narrowing and mild to borderline moderate left foraminal narrowing due to disc bulge and uncovertebral spur.    -C6-7: No significant canal or foraminal stenosis.    -C7-T1: No significant canal or foraminal stenosis.      IMPRESSION  1. Straightened cervical lordosis. No compression deformity, edema or listhesis.    2. Multilevel degenerative discogenic disease with mild degree of canal narrowing at several levels.  -Ventral cord contact and minimal distortion due to disc bulge C5-C6 as above.    3. Varying degrees of bilateral foraminal narrowing most significant right C4-5 and left C5-6.    Signed By: Emilia Beck, MD on 10/13/2017 3:58 PM   ??  ??  ??   Lumbar MRI from 10/13/2017 was personally reviewed with the patient and demonstrated:  ??   Interface, Powers[facility] Rad Res - 10/13/2017 ??4:37 PM EDT  EXAM: MR LUMBAR SPINE WITHOUT CONTRAST    CLINICAL INDICATION/HISTORY: ??M54.41: Low back pain with right-sided sciatica    COMPARISON: None    TECHNIQUE: Multiplanar multi-sequential imaging of the lumbar spine without contrast. T1W, T2W and STIR sequences were obtained in the sagittal plane. Stacked axial T2 and axial T1W through the lower 3 disc levels.  FINDINGS:     Postoperative : None.    Vertebral alignment: Mild straightening of lumbar lordosis with trace retrolisthesis of L5. No spondylolysis.    Vertebral body heights: Normal. Mild endplate concavity in L5 and L4.    Marrow signal: Normal.    Cord: Conus medullaris terminates at the L1-L2 level with normal signal.    Soft tissues: Normal.    Correlation of axial and sagittal data through the disc levels:    -L1-2: No significant disc pathology. No spinal canal or foraminal stenosis.    -L2-3: No significant disc pathology. Mild facet and ligamentous hypertrophy with bilateral facet joint effusion. No central or foraminal stenosis    -L3-4: No significant disc pathology. Similarly, mild bilateral facet and ligamentous hypertrophy with small facet joint effusion. No central or foraminal stenosis.    -L4-5: Mild posterior disc bulge. No central stenosis. More severe facet and ligamentous hypertrophy with mild facet inflammation. No significant foraminal stenosis    -L5-S1: Disc bulge most prominent. Still no severe central stenosis. Facet and ligamentous hypertrophy with inflammation. Moderate bilateral foraminal stenosis. Mild compression of bilateral exiting L5 nerve root suspected.    IMPRESSION  Posterior disc bulge and facet/ligamentum flavum hypertrophy most prominent at L5-S1. Moderate bilateral foraminal stenosis with suspected mild compression bilateral exiting L5 nerve roots. Facet hypertrophy and  inflammation throughout.    Signed By: Gretel Acre, MD on 10/13/2017 4:35 PM   ??  ??  Right upper extremity EMG from 12/27/2008 was personally reviewed with the patient and demonstrated:  NCV & EMG Findings:  All nerve conduction studies (as indicated in the following tables) were within normal limits. ??  ??  All examined muscles (as indicated in the following table) showed no evidence of electrical instability.????  ??  INTERPRETATION  This was a normal nerve conduction and EMG study showing there to be no signs of neuropathy, myopathy, or radiculopathy in the nerves and muscles tested.??  ??  CLINICAL INTERPRETATION  There are no electrodiagnostic findings correlating with his symptoms.??  ??    Written by Robinette Haines, [place], as dictated by Delrae Sawyers, MD.  I, Dr. Delrae Sawyers confirm that all documentation is accurate.

## 2018-05-23 NOTE — Patient Instructions (Signed)
Neck Arthritis: Exercises  Introduction  Here are some examples of exercises for you to try. The exercises may be suggested for a condition or for rehabilitation. Start each exercise slowly. Ease off the exercises if you start to have pain.  You will be told when to start these exercises and which ones will work best for you.  How to do the exercises  Neck stretches to the side   1. This stretch works best if you keep your shoulder down as you lean away from it. To help you remember to do this, start by relaxing your shoulders and lightly holding on to your thighs or your chair.  2. Tilt your head toward your shoulder and hold for 15 to 30 seconds. Let the weight of your head stretch your muscles.  3. Repeat 2 to 4 times toward each shoulder.    Chin tuck   1. Lie on the floor with a rolled-up towel under your neck. Your head should be touching the floor.  2. Slowly bring your chin toward your chest.  3. Hold for a count of 6, and then relax for up to 10 seconds.  4. Repeat 8 to 12 times.    Active cervical rotation   1. Sit in a firm chair, or stand up straight.  2. Keeping your chin level, turn your head to the right, and hold for 15 to 30 seconds.  3. Turn your head to the left and hold for 15 to 30 seconds.  4. Repeat 2 to 4 times to each side.    Shoulder blade squeeze   1. While standing, squeeze your shoulder blades together.  2. Do not raise your shoulders up as you are squeezing.  3. Hold for 6 seconds.  4. Repeat 8 to 12 times.    Shoulder rolls   1. Sit comfortably with your feet shoulder-width apart. You can also do this exercise standing up.  2. Roll your shoulders up, then back, and then down in a smooth, circular motion.  3. Repeat 2 to 4 times.    Follow-up care is a key part of your treatment and safety. Be sure to make and go to all appointments, and call your doctor if you are having problems. It's also a good idea to know your test results and keep a list of the medicines you take.   Where can you learn more?  Go to http://www.healthwise.net/[employer]Help[employer]  Enter V867 in the search box to learn more about "Neck Arthritis: Exercises."  Current as of: June 26, 2019Content Version: 12.4  ?? 2006-2020 [employer].  Care instructions adapted under license by [employer] (which disclaims liability or warranty for this information). If you have questions about a medical condition or this instruction, always ask your healthcare professional. [employer]wise, Inc[employer] disclaims any warranty or liability for your use of this information.           Low Back Arthritis: Exercises  Introduction  Here are some examples of typical rehabilitation exercises for your condition. Start each exercise slowly. Ease off the exercise if you start to have pain.  Your doctor or physical therapist will tell you when you can start these exercises and which ones will work best for you.  When you are not being active, find a comfortable position for rest. Some people are comfortable on the floor or a medium-firm bed with a small pillow under their head and another under their knees. Some people prefer to lie on their side with   a pillow between their knees. Don't stay in one position for too long.  Take short walks (10 to 20 minutes) every 2 to 3 hours. Avoid slopes, hills, and stairs until you feel better. Walk only distances you can manage without pain, especially leg pain.  How to do the exercises  Pelvic tilt   1. Lie on your back with your knees bent.  2. "Brace" your stomach???tighten your muscles by pulling in and imagining your belly button moving toward your spine.  3. Press your lower back into the floor. You should feel your hips and pelvis rock back.  4. Hold for 6 seconds while breathing smoothly.  5. Relax and allow your pelvis and hips to rock forward.  6. Repeat 8 to 12 times.    Back stretches   1. Get down on your hands and knees on the floor.   2. Relax your head and allow it to droop. Round your back up toward the ceiling until you feel a nice stretch in your upper, middle, and lower back. Hold this stretch for as long as it feels comfortable, or about 15 to 30 seconds.  3. Return to the starting position with a flat back while you are on your hands and knees.  4. Let your back sway by pressing your stomach toward the floor. Lift your buttocks toward the ceiling.  5. Hold this position for 15 to 30 seconds.  6. Repeat 2 to 4 times.    Follow-up care is a key part of your treatment and safety. Be sure to make and go to all appointments, and call your doctor if you are having problems. It's also a good idea to know your test results and keep a list of the medicines you take.  Where can you learn more?  Go to http://www.healthwise.net/[employer]HelpConnections  Enter T094 in the search box to learn more about "Low Back Arthritis: Exercises."  Current as of: June 26, 2019Content Version: 12.4  ?? 2006-2020 [employer].  Care instructions adapted under license by [employer] (which disclaims liability or warranty for this information). If you have questions about a medical condition or this instruction, always ask your healthcare professional. [employer], Inc[employer] disclaims any warranty or liability for your use of this information.

## 2018-05-28 ENCOUNTER — Encounter

## 2018-05-28 MED ORDER — PREGABALIN 75 MG CAP
75 mg | ORAL_CAPSULE | ORAL | 1 refills | Status: AC
Start: 2018-05-28 — End: ?

## 2018-06-13 ENCOUNTER — Telehealth
Admit: 2018-06-13 | Discharge: 2018-06-13 | Payer: PRIVATE HEALTH INSURANCE | Attending: Family Medicine | Primary: Family Medicine

## 2018-06-13 ENCOUNTER — Telehealth: Attending: Family Medicine | Primary: Family Medicine

## 2018-06-13 DIAGNOSIS — M5416 Radiculopathy, lumbar region: Secondary | ICD-10-CM

## 2018-06-13 MED ORDER — CHLORZOXAZONE 500 MG TAB
500 mg | ORAL_TABLET | ORAL | 1 refills | Status: AC
Start: 2018-06-13 — End: ?

## 2018-06-13 NOTE — Progress Notes (Signed)
Progress Notes by Tobie Lords, MD at 06/13/18 0830                Author: Tobie Lords, MD  Service: --  Author Type: Physician       Filed: 06/13/18 1223  Encounter Date: 06/13/2018  Status: Signed          Editor: Tobie Lords, MD (Physician)                         [facility]  [address]   [place], [place] [address]   Phone: [phone]   Fax: [phone]      Pt's date of birth: Feb 16, 1970      ASSESSMENT   Diagnoses and all orders for this visit:      1. Lumbar radiculopathy      2. Lumbar facet arthropathy      3. Muscle spasm   -     chlorzoxazone (Parafon Forte DSC) 500 mg tablet; Take 1 tab by mouth BID-TID as needed for muscle spasm.      4. Chronic pain of right knee   -     REFERRAL TO ORTHOPEDICS      5. Left hip pain   -     REFERRAL TO ORTHOPEDICS      6. Sleep apnea, unspecified type      7. Tobacco use             IMPRESSION AND PLAN:   Jim Shaw is a 48 y.o. male with history of cervical and lumbar pain. He complains of continued pain in the lower back that radiates down the right leg to the foot. Pt also complains  of an occasional catching sensation in the left hip, right knee pain, and numbness in the right hand. He restarted Lyrica 75 mg 1 cap BID and Celebrex 200 mg 1 cap BID since his last office visit with minimal improvement. . Pt denies any change in symptoms  when taking Zanaflex 4 mg. Of note, he denies any history of diabetes mellitus. Pt at this time desires to proceed with medication evaluation.       1) Pt was given information on lumbar arthritis exercises.    2) He will increase his Lyrica 75 mg to 2 caps BID to better manage his symptoms.   3) Pt will continue taking Celebrex 200 mg as prescribed and he does not need a refill at this time.    4) Pt will discontinue Zanaflex since it was not effective.    5) He was prescribed Parafon Forte 1 tab BID-TID prn muscle spasm to take in  place of the Zanaflex.    6) I strongly encouraged the patient to quit smoking and gave information on smoking cessation.   7) Pt was referred to Dr. Liliane Channel for evaluation of his right knee and left hip.    8) Jim Shaw has a reminder for  a "due or due soon" health maintenance. I have asked that he contact his  primary care provider, Jim Decree, MD, for follow-up on this health maintenance.   9) PMP demonstrated consistency with prescribing.      Follow-up and Dispositions      ??  Return in about 6 weeks (around 07/25/2018) for Medication follow up with in office visit.                CPT  Codes [phone] for Established Patients may apply to this TeleHealth Visit   Time-based coding, delete if not needed: I spent 28 minutes and 37 seconds from 8:36 AM to 9:05 AM with this established patient, and >50% of the time was spent counseling and/or coordinating care regarding his symptoms and medications.       Due to this being a TeleHealth evaluation, many elements of the physical examination are unable to be assessed.       Pursuant to the emergency declaration under the [facility] Act and the [employer], 1135 waiver authority and the [employer] Act, this Virtual Visit was conducted, with patient's consent,  to reduce the patient's risk of exposure to COVID-19 and provide continuity of care for an established patient.       Services were provided through a telephone discussion virtually to substitute for in-person clinic visit.      HISTORY OF PRESENT ILLNESS:   Jim Shaw is a 48 y.o.  male with history of cervical and lumbar pain and was evaluated from his  home by telephone at the [facility] location on 06/13/2018 with his  verbal consent for follow up. He complains of continued pain in the lower back that radiates down the right leg to the foot. Pt also complains of an occasional catching sensation in the left hip. He  admits to right knee pain/giving way but notes that  he was unable to follow up with Dr. Liliane Channel due to COVID-19. Pt reports to improvement since wearing a knee support. He reports relief lasting a few weeks when he previously had steroid injections with Dr.Wardell in 11-12/2016. Pt notes that he was then  placed on disability since he was unable to bend, stoop, or lift for his job secondary to pain. He reports numbness in the right hand and minimal relief with previous carpal tunnel injections with Dr. Andrey Campanile and when using a wrist splint. Pt admits to  difficulty with sleep. He reports one episode where he fell asleep at 3 AM, woke up at 4:30-4:45 AM experiencing difficulty breathing. Pt notes that he sat up on the side of the bed and then tried to stand up but fell. He admits to recently being diagnosed  with sleep apnea and reports that his CPAP machine is supposed to be delivered sometime this week. Pt notes that he used to take Seroquel, switched to Valium, but still has difficulty with sleep. He restarted Lyrica 75 mg 1 cap BID and Celebrex 200 mg  1 cap BID since his last office visit without sedation. Pt notes slight improvement in his right leg pain with the medications but reports continued lower back pain. He reports minimal relief with a Medrol Dosepak. Pt denies any change in symptoms when  taking Zanaflex 4 mg. Of note, he denies any history of diabetes mellitus. Pt at this time desires to proceed with medication evaluation.      Of note, he is a smoker.       Pain Scale: 8/10      PCP: Jim Decree, MD         Past Medical History:        Diagnosis  Date         ?  Costochondritis       ?  Major depressive disorder  11/08/2017         ?  Psychiatric disorder  Social History          Socioeconomic History         ?  Marital status:  SINGLE              Spouse name:  Not on file         ?  Number of children:  Not on file     ?  Years of education:  Not on file     ?  Highest  education level:  Not on file       Occupational History        ?  Not on file       Social Needs         ?  Financial resource strain:  Not on file        ?  Food insecurity              Worry:  Not on file         Inability:  Not on file        ?  Transportation needs              Medical:  Not on file         Non-medical:  Not on file       Tobacco Use         ?  Smoking status:  Current Every Day Smoker              Packs/day:  0.25         Years:  20.00         Pack years:  5.00         ?  Smokeless tobacco:  Never Used       Substance and Sexual Activity         ?  Alcohol use:  Yes             Comment: 5-6 shots/day         ?  Drug use:  Yes              Types:  Marijuana         ?  Sexual activity:  Not on file       Lifestyle        ?  Physical activity              Days per week:  Not on file         Minutes per session:  Not on file         ?  Stress:  Not on file       Relationships        ?  Social Engineer, manufacturing systemsconnections              Talks on phone:  Not on file         Gets together:  Not on file         Attends religious service:  Not on file         Active member of club or organization:  Not on file         Attends meetings of clubs or organizations:  Not on file         Relationship status:  Not on file        ?  Intimate partner violence              Fear of current or ex partner:  Not on file         Emotionally abused:  Not on file         Physically abused:  Not on file         Forced sexual activity:  Not on file        Other Topics  Concern         ?  Military Service  Not Asked     ?  Blood Transfusions  Not Asked     ?  Caffeine Concern  Not Asked     ?  Occupational Exposure  Not Asked     ?  Hobby Hazards  Not Asked     ?  Sleep Concern  Not Asked     ?  Stress Concern  Not Asked     ?  Weight Concern  Not Asked     ?  Special Diet  Not Asked     ?  Back Care  Not Asked     ?  Exercise  Not Asked     ?  Bike Helmet  Not Asked     ?  Seat Belt  Not Asked     ?  Self-Exams  Not Asked       Social  History Narrative        ?  Not on file             Current Outpatient Medications          Medication  Sig  Dispense  Refill           ?  pregabalin (Lyrica) 75 mg capsule  Take 1-2 caps by mouth two (2) times daily as directed. Max Daily Amount: 300 mg.  Indications: disorder characterized by stiff, tender & painful muscles  120 Cap  1     ?  diazePAM (VALIUM) 10 mg tablet  TK 1 T PO  QHS         ?  lamoTRIgine (LaMICtal) 25 mg tablet  TK 1 T PO NIGHTLY FOR 2 WKS THEN 2 TS NIGHTLY         ?  ramelteon (ROZEREM) 8 mg tablet  TK 1 T PO HS         ?  risperiDONE (RisperDAL) 1 mg tablet  TK 2 TS PO HS         ?  celecoxib (CeleBREX) 200 mg capsule  Take 1 Cap by mouth two (2) times daily (with meals).  60 Cap  2     ?  tiZANidine (ZANAFLEX) 4 mg tablet  Take 1 tab by mouth BID-TID as needed for muscle spasm.  60 Tab  1     ?  triamcinolone acetonide (KENALOG) 0.1 % ointment           ?  fluticasone propionate (FLONASE) 50 mcg/actuation nasal spray  2 Sprays by Both Nostrils route daily.         ?  ciclopirox (PENLAC) 8 % solution  Apply  to affected area nightly.         ?  hydrOXYzine HCl (ATARAX) 50 mg tablet  Take 50 mg by mouth as needed for Anxiety.               ?  venlafaxine-SR (EFFEXOR-XR) 150 mg capsule  Take 1 Cap by mouth daily (with breakfast). Indications: Anxiousness associated with Depression  15 Cap  1           ?  prazosin (MINIPRESS) 1 mg capsule  TK 1 C PO QHS         ?  methylPREDNISolone (MEDROL DOSEPACK) 4 mg tablet  Per dose pack instructions (Patient not taking: Reported on 06/13/2018)  1 Dose Pack  0     ?  mirtazapine (REMERON) 15 mg tablet  TK 1 T PO HS         ?  celecoxib (CELEBREX) 100 mg capsule  Take 1 Cap by mouth daily.         ?  tiZANidine (ZANAFLEX) 4 mg tablet  Take 1 Tab by mouth three (3) times daily. (Patient not taking: Reported on 05/23/2018)  90 Tab  1     ?  pregabalin (LYRICA) 75 mg capsule  Take 1 Cap by mouth two (2) times a day. Max Daily Amount: 150 mg. (Patient not  taking: Reported on 05/23/2018)  28 Cap  0     ?  pregabalin (LYRICA) 100 mg capsule  Take 1 Cap by mouth three (3) times daily. Max Daily Amount: 300 mg. (Patient not taking: Reported on 05/23/2018)  90 Cap  1     ?  gabapentin (NEURONTIN) 600 mg tablet  Take 600 mg by mouth three (3) times daily.               ?  QUEtiapine (SEROQUEL) 50 mg tablet  Take 1 Tab by mouth nightly. Indications: Additional Medications to Treat Depression (Patient not taking: Reported on 05/23/2018)  1 Tab  0             Allergies        Allergen  Reactions         ?  Oxycodone-Acetaminophen  Anaphylaxis and Swelling     ?  Duloxetine Hcl  Rash     ?  Aspirin  Other (comments)     ?  Cortisone  Nausea and Vomiting             Also rash          ?  Ibuprofen  Rash              REVIEW OF SYSTEMS      Constitutional: Negative for fever, chills, or weight change.    Respiratory: Negative for cough or shortness of breath.      Cardiovascular: Negative for chest pain or palpitations.   Gastrointestinal: Negative for acid reflux, change in bowel habits, or constipation.   Musculoskeletal: Positive for lumbar, knee and hip pain.   Neurological: Negative for headaches, dizziness, positive for numbness.   Psychiatric/Behavioral: Positive for difficulty with sleep.        IMAGING:      Hip and knee x-rays from 04/10/2018 were personally reviewed with the patient and demonstrated:   Minimal degenerative changes.        Cervical MRI from 10/13/2017 was personally reviewed with the patient and demonstrated:     ??       Interface, [facility] Rad Res - 10/13/2017 ??4:00 PM EDT  EXAM: MRI of cervical spine without contrast    CLINICAL INDICATION/HISTORY:  M54.41: Low back pain with right-sided sciatica    COMPARISON: None    TECHNIQUE: Multiplanar multi-sequential imaging of the cervical spine without contrast. T1W, T2W and STIR sequences were obtained in the sagittal plane. T2W and GRE sequences  were obtained in the axial plane.    FINDINGS:      Postoperative changes: None.    Alignment: Overall  straightening of normal cervical lordosis. No focal listhesis.    Vertebral body heights: Preserved. No edema.  -Mild  intervertebral disc space narrowing at C5-6 and C4-5. Slight disc bulge at several levels.    Marrow signal: No edema.    Cord: Normal caliber. No syrinx or edema.    Cerebellar tonsils: No Chiari 1 malformation.      Correlation  of axial and sagittal data through the disc levels:    -C2-3: No significant canal or foraminal stenosis    -C3-4: Mild broad-based posterior disc bulge effaces fluid space and contributes to mild central canal narrowing to 10 mm. No significant  foraminal narrowing.    -C4-5: Minimal broad-based posterior disc bulge. No significant canal narrowing.   -Mild to moderate right foraminal stenosis due to asymmetric disc bulge and some uncovertebral spur.    -C5-6: Broad-based posterior  disc bulge effaces fluid space and may contact the left ventral cervical cord. No edema. Mild central canal narrowing to 9.5 mm.   -There is mild left lateral recess narrowing and mild to borderline moderate left foraminal narrowing due to disc bulge  and uncovertebral spur.    -C6-7: No significant canal or foraminal stenosis.    -C7-T1: No significant canal or foraminal stenosis.      IMPRESSION  1. Straightened cervical lordosis. No compression deformity, edema or listhesis.     2. Multilevel degenerative discogenic disease with mild degree of canal narrowing at several levels.  -Ventral cord contact and minimal distortion due to disc bulge C5-C6 as above.    3. Varying degrees of bilateral foraminal narrowing most  significant right C4-5 and left C5-6.    Signed By: Emilia Beck, MD on 10/13/2017 3:58 PM     ??   ??   ??   Lumbar MRI from 10/13/2017 was personally reviewed with the patient and demonstrated:     ??       Interface, [facility] Rad Res - 10/13/2017 ??4:37 PM EDT  EXAM: MR LUMBAR SPINE WITHOUT CONTRAST    CLINICAL  INDICATION/HISTORY: ??M54.41:  Low back pain with right-sided sciatica    COMPARISON: None    TECHNIQUE: Multiplanar multi-sequential imaging of the lumbar spine without contrast. T1W, T2W and STIR sequences were obtained in the sagittal plane. Stacked axial T2 and axial  T1W through the lower 3 disc levels.    FINDINGS:     Postoperative : None.    Vertebral alignment: Mild straightening of lumbar lordosis with trace retrolisthesis of L5. No spondylolysis.    Vertebral body heights: Normal.  Mild endplate concavity in L5 and L4.    Marrow signal: Normal.    Cord: Conus medullaris terminates at the L1-L2 level with normal signal.    Soft tissues: Normal.    Correlation of axial and sagittal data through the disc  levels:    -L1-2: No significant disc pathology. No spinal canal or foraminal stenosis.    -L2-3: No significant disc pathology. Mild facet and ligamentous hypertrophy with bilateral facet joint effusion. No central or foraminal stenosis     -L3-4: No significant disc pathology. Similarly, mild bilateral facet and ligamentous hypertrophy with small facet joint effusion. No central or foraminal stenosis.    -L4-5: Mild posterior disc bulge. No central stenosis. More severe facet and  ligamentous hypertrophy with mild facet inflammation. No significant foraminal stenosis    -L5-S1: Disc bulge most prominent. Still no severe central stenosis. Facet and ligamentous hypertrophy with inflammation. Moderate bilateral foraminal stenosis.  Mild compression of bilateral exiting L5 nerve root suspected.    IMPRESSION  Posterior disc bulge and facet/ligamentum flavum hypertrophy most prominent at L5-S1. Moderate bilateral foraminal stenosis with suspected mild compression bilateral  exiting L5 nerve roots. Facet hypertrophy and inflammation throughout.    Signed By: Gretel Acre, MD on 10/13/2017 4:35 PM     ??   ??   Right upper extremity EMG from 12/27/2008 was personally reviewed with the patient and demonstrated:    NCV & EMG Findings:   All nerve conduction studies (as indicated in the following tables) were within normal limits. ??   ??   All examined muscles (as indicated in the following table) showed no evidence of electrical instability.????   ??   INTERPRETATION   This was a normal nerve conduction and EMG study showing there to be no signs of neuropathy, myopathy, or radiculopathy in the nerves and muscles tested.??   ??   CLINICAL INTERPRETATION   There are no electrodiagnostic findings correlating with his symptoms.??   ??   Written by Robinette Haines, [place], as dictated by Delrae Sawyers, MD.   I, Dr. Delrae Sawyers confirm that all documentation is accurate.

## 2018-06-13 NOTE — Progress Notes (Signed)
[facility]  [address], [address]  [place], [place] [address]  Phone: [phone]  Fax: [phone]    Pt's date of birth: 12-07-1970    ASSESSMENT   Diagnoses and all orders for this visit:    1. Lumbar radiculopathy    2. Lumbar facet arthropathy    3. Muscle spasm  -     chlorzoxazone (Parafon Forte DSC) 500 mg tablet; Take 1 tab by mouth BID-TID as needed for muscle spasm.    4. Chronic pain of right knee  -     REFERRAL TO ORTHOPEDICS    5. Left hip pain  -     REFERRAL TO ORTHOPEDICS    6. Sleep apnea, unspecified type    7. Tobacco use         IMPRESSION AND PLAN:  [name] is a 48 y.o. male with history of cervical and lumbar pain. He complains of continued pain in the lower back that radiates down the right leg to the foot. Pt also complains of an occasional catching sensation in the left hip, right knee pain, and numbness in the right hand. He restarted Lyrica 75 mg 1 cap BID and Celebrex 200 mg 1 cap BID since his last office visit with minimal improvement. . Pt denies any change in symptoms when taking Zanaflex 4 mg. Of note, he denies any history of diabetes mellitus. Pt at this time desires to proceed with medication evaluation.     1) Pt was given information on lumbar arthritis exercises.   2) He will increase his Lyrica 75 mg to 2 caps BID to better manage his symptoms.  3) Pt will continue taking Celebrex 200 mg as prescribed and he does not need a refill at this time.   4) Pt will discontinue Zanaflex since it was not effective.   5) He was prescribed Parafon Forte 1 tab BID-TID prn muscle spasm to take in place of the Zanaflex.   6) I strongly encouraged the patient to quit smoking and gave information on smoking cessation.  7) Pt was referred to Dr. Liliane Channel for evaluation of his right knee and left hip.   8) Mr. Spada has a reminder for a "due or due soon" health maintenance.  I have asked that he contact his primary care provider, Jim Decree, MD, for follow-up on this health maintenance.  9) PMP demonstrated consistency with prescribing.   Follow-up and Dispositions    ?? Return in about 6 weeks (around 07/25/2018) for Medication follow up with in office visit.         CPT Codes [phone] for Established Patients may apply to this TeleHealth Visit  Time-based coding, delete if not needed: I spent 28 minutes and 37 seconds from 8:36 AM to 9:05 AM with this established patient, and >50% of the time was spent counseling and/or coordinating care regarding his symptoms and medications.     Due to this being a TeleHealth evaluation, many elements of the physical examination are unable to be assessed.     Pursuant to the emergency declaration under the [facility] Act and the [employer], 1135 waiver authority and the [employer] Act, this Virtual Visit was conducted, with patient's consent, to reduce the patient's risk of exposure to COVID-19 and provide continuity of care for an established patient.     Services were provided through a telephone discussion virtually to substitute for in-person clinic visit.  HISTORY OF PRESENT ILLNESS:  Jim Shaw is a 48 y.o. male with history of cervical and lumbar pain and was evaluated from his home by telephone at the [facility] location on 06/13/2018 with his verbal consent for follow up. He complains of continued pain in the lower back that radiates down the right leg to the foot. Pt also complains of an occasional catching sensation in the left hip. He admits to right knee pain/giving way but notes that he was unable to follow up with Dr. Liliane ChannelBlasdell due to COVID-19. Pt reports to improvement since wearing a knee support. He reports relief lasting a few weeks when he previously had steroid injections with Dr.Wardell in  11-12/2016. Pt notes that he was then placed on disability since he was unable to bend, stoop, or lift for his job secondary to pain. He reports numbness in the right hand and minimal relief with previous carpal tunnel injections with Dr. Andrey CampanileWilson and when using a wrist splint. Pt admits to difficulty with sleep. He reports one episode where he fell asleep at 3 AM, woke up at 4:30-4:45 AM experiencing difficulty breathing. Pt notes that he sat up on the side of the bed and then tried to stand up but fell. He admits to recently being diagnosed with sleep apnea and reports that his CPAP machine is supposed to be delivered sometime this week. Pt notes that he used to take Seroquel, switched to Valium, but still has difficulty with sleep. He restarted Lyrica 75 mg 1 cap BID and Celebrex 200 mg 1 cap BID since his last office visit without sedation. Pt notes slight improvement in his right leg pain with the medications but reports continued lower back pain. He reports minimal relief with a Medrol Dosepak. Pt denies any change in symptoms when taking Zanaflex 4 mg. Of note, he denies any history of diabetes mellitus. Pt at this time desires to proceed with medication evaluation.    Of note, he is a smoker.     Pain Scale: 8/10    PCP: Jim Shaw, Avantae R, MD     Past Medical History:   Diagnosis Date   ??? Costochondritis    ??? Major depressive disorder 11/08/2017   ??? Psychiatric disorder         Social History     Socioeconomic History   ??? Marital status: SINGLE     Spouse name: Not on file   ??? Number of children: Not on file   ??? Years of education: Not on file   ??? Highest education level: Not on file   Occupational History   ??? Not on file   Social Needs   ??? Financial resource strain: Not on file   ??? Food insecurity     Worry: Not on file     Inability: Not on file   ??? Transportation needs     Medical: Not on file     Non-medical: Not on file   Tobacco Use   ??? Smoking status: Current Every Day Smoker     Packs/day: 0.25      Years: 20.00     Pack years: 5.00   ??? Smokeless tobacco: Never Used   Substance and Sexual Activity   ??? Alcohol use: Yes     Comment: 5-6 shots/day   ??? Drug use: Yes     Types: Marijuana   ??? Sexual activity: Not on file   Lifestyle   ??? Physical activity     Days per  week: Not on file     Minutes per session: Not on file   ??? Stress: Not on file   Relationships   ??? Social Wellsite geologist on phone: Not on file     Gets together: Not on file     Attends religious service: Not on file     Active member of club or organization: Not on file     Attends meetings of clubs or organizations: Not on file     Relationship status: Not on file   ??? Intimate partner violence     Fear of current or ex partner: Not on file     Emotionally abused: Not on file     Physically abused: Not on file     Forced sexual activity: Not on file   Other Topics Concern   ??? Military Service Not Asked   ??? Blood Transfusions Not Asked   ??? Caffeine Concern Not Asked   ??? Occupational Exposure Not Asked   ??? Hobby Hazards Not Asked   ??? Sleep Concern Not Asked   ??? Stress Concern Not Asked   ??? Weight Concern Not Asked   ??? Special Diet Not Asked   ??? Back Care Not Asked   ??? Exercise Not Asked   ??? Bike Helmet Not Asked   ??? Seat Belt Not Asked   ??? Self-Exams Not Asked   Social History Narrative   ??? Not on file       Current Outpatient Medications   Medication Sig Dispense Refill   ??? pregabalin (Lyrica) 75 mg capsule Take 1-2 caps by mouth two (2) times daily as directed. Max Daily Amount: 300 mg.  Indications: disorder characterized by stiff, tender & painful muscles 120 Cap 1   ??? diazePAM (VALIUM) 10 mg tablet TK 1 T PO  QHS     ??? lamoTRIgine (LaMICtal) 25 mg tablet TK 1 T PO NIGHTLY FOR 2 WKS THEN 2 TS NIGHTLY     ??? ramelteon (ROZEREM) 8 mg tablet TK 1 T PO HS     ??? risperiDONE (RisperDAL) 1 mg tablet TK 2 TS PO HS     ??? celecoxib (CeleBREX) 200 mg capsule Take 1 Cap by mouth two (2) times daily (with meals). 60 Cap 2    ??? tiZANidine (ZANAFLEX) 4 mg tablet Take 1 tab by mouth BID-TID as needed for muscle spasm. 60 Tab 1   ??? triamcinolone acetonide (KENALOG) 0.1 % ointment      ??? fluticasone propionate (FLONASE) 50 mcg/actuation nasal spray 2 Sprays by Both Nostrils route daily.     ??? ciclopirox (PENLAC) 8 % solution Apply  to affected area nightly.     ??? hydrOXYzine HCl (ATARAX) 50 mg tablet Take 50 mg by mouth as needed for Anxiety.     ??? venlafaxine-SR (EFFEXOR-XR) 150 mg capsule Take 1 Cap by mouth daily (with breakfast). Indications: Anxiousness associated with Depression 15 Cap 1   ??? prazosin (MINIPRESS) 1 mg capsule TK 1 C PO QHS     ??? methylPREDNISolone (MEDROL DOSEPACK) 4 mg tablet Per dose pack instructions (Patient not taking: Reported on 06/13/2018) 1 Dose Pack 0   ??? mirtazapine (REMERON) 15 mg tablet TK 1 T PO HS     ??? celecoxib (CELEBREX) 100 mg capsule Take 1 Cap by mouth daily.     ??? tiZANidine (ZANAFLEX) 4 mg tablet Take 1 Tab by mouth three (3) times daily. (Patient not taking: Reported on 05/23/2018)  90 Tab 1   ??? pregabalin (LYRICA) 75 mg capsule Take 1 Cap by mouth two (2) times a day. Max Daily Amount: 150 mg. (Patient not taking: Reported on 05/23/2018) 28 Cap 0   ??? pregabalin (LYRICA) 100 mg capsule Take 1 Cap by mouth three (3) times daily. Max Daily Amount: 300 mg. (Patient not taking: Reported on 05/23/2018) 90 Cap 1   ??? gabapentin (NEURONTIN) 600 mg tablet Take 600 mg by mouth three (3) times daily.     ??? QUEtiapine (SEROQUEL) 50 mg tablet Take 1 Tab by mouth nightly. Indications: Additional Medications to Treat Depression (Patient not taking: Reported on 05/23/2018) 1 Tab 0       Allergies   Allergen Reactions   ??? Oxycodone-Acetaminophen Anaphylaxis and Swelling   ??? Duloxetine Hcl Rash   ??? Aspirin Other (comments)   ??? Cortisone Nausea and Vomiting     Also rash    ??? Ibuprofen Rash         REVIEW OF SYSTEMS    Constitutional: Negative for fever, chills, or weight change.    Respiratory: Negative for cough or shortness of breath.     Cardiovascular: Negative for chest pain or palpitations.  Gastrointestinal: Negative for acid reflux, change in bowel habits, or constipation.  Musculoskeletal: Positive for lumbar, knee and hip pain.  Neurological: Negative for headaches, dizziness, positive for numbness.  Psychiatric/Behavioral: Positive for difficulty with sleep.      IMAGING:    Hip and knee x-rays from 04/10/2018 were personally reviewed with the patient and demonstrated:  Minimal degenerative changes.      Cervical MRI from 10/13/2017 was personally reviewed with the patient and demonstrated:  ??   Interface, [facility] Rad Res - 10/13/2017 ??4:00 PM EDT  EXAM: MRI of cervical spine without contrast    CLINICAL INDICATION/HISTORY: M54.41: Low back pain with right-sided sciatica    COMPARISON: None    TECHNIQUE: Multiplanar multi-sequential imaging of the cervical spine without contrast. T1W, T2W and STIR sequences were obtained in the sagittal plane. T2W and GRE sequences were obtained in the axial plane.    FINDINGS:     Postoperative changes: None.    Alignment: Overall straightening of normal cervical lordosis. No focal listhesis.    Vertebral body heights: Preserved. No edema.  -Mild intervertebral disc space narrowing at C5-6 and C4-5. Slight disc bulge at several levels.    Marrow signal: No edema.    Cord: Normal caliber. No syrinx or edema.    Cerebellar tonsils: No Chiari 1 malformation.      Correlation of axial and sagittal data through the disc levels:    -C2-3: No significant canal or foraminal stenosis    -C3-4: Mild broad-based posterior disc bulge effaces fluid space and contributes to mild central canal narrowing to 10 mm. No significant foraminal narrowing.    -C4-5: Minimal broad-based posterior disc bulge. No significant canal narrowing.   -Mild to moderate right foraminal stenosis due to asymmetric disc bulge and some uncovertebral spur.     -C5-6: Broad-based posterior disc bulge effaces fluid space and may contact the left ventral cervical cord. No edema. Mild central canal narrowing to 9.5 mm.   -There is mild left lateral recess narrowing and mild to borderline moderate left foraminal narrowing due to disc bulge and uncovertebral spur.    -C6-7: No significant canal or foraminal stenosis.    -C7-T1: No significant canal or foraminal stenosis.      IMPRESSION  1. Straightened cervical lordosis.  No compression deformity, edema or listhesis.    2. Multilevel degenerative discogenic disease with mild degree of canal narrowing at several levels.  -Ventral cord contact and minimal distortion due to disc bulge C5-C6 as above.    3. Varying degrees of bilateral foraminal narrowing most significant right C4-5 and left C5-6.    Signed By: RichEmilia Beck MD on 10/13/2017 3:58 PM   ??  ??  ??  Lumbar MRI from 10/13/2017 was personally reviewed with the patient and demonstrated:  ??   Interface, [facility] Rad Res - 10/13/2017 ??4:37 PM EDT  EXAM: MR LUMBAR SPINE WITHOUT CONTRAST    CLINICAL INDICATION/HISTORY: ??M54.41: Low back pain with right-sided sciatica    COMPARISON: None    TECHNIQUE: Multiplanar multi-sequential imaging of the lumbar spine without contrast. T1W, T2W and STIR sequences were obtained in the sagittal plane. Stacked axial T2 and axial T1W through the lower 3 disc levels.    FINDINGS:     Postoperative : None.    Vertebral alignment: Mild straightening of lumbar lordosis with trace retrolisthesis of L5. No spondylolysis.    Vertebral body heights: Normal. Mild endplate concavity in L5 and L4.    Marrow signal: Normal.    Cord: Conus medullaris terminates at the L1-L2 level with normal signal.    Soft tissues: Normal.    Correlation of axial and sagittal data through the disc levels:    -L1-2: No significant disc pathology. No spinal canal or foraminal stenosis.    -L2-3: No significant disc pathology. Mild facet and ligamentous  hypertrophy with bilateral facet joint effusion. No central or foraminal stenosis    -L3-4: No significant disc pathology. Similarly, mild bilateral facet and ligamentous hypertrophy with small facet joint effusion. No central or foraminal stenosis.    -L4-5: Mild posterior disc bulge. No central stenosis. More severe facet and ligamentous hypertrophy with mild facet inflammation. No significant foraminal stenosis    -L5-S1: Disc bulge most prominent. Still no severe central stenosis. Facet and ligamentous hypertrophy with inflammation. Moderate bilateral foraminal stenosis. Mild compression of bilateral exiting L5 nerve root suspected.    IMPRESSION  Posterior disc bulge and facet/ligamentum flavum hypertrophy most prominent at L5-S1. Moderate bilateral foraminal stenosis with suspected mild compression bilateral exiting L5 nerve roots. Facet hypertrophy and inflammation throughout.    Signed By: Gretel Acre, MD on 10/13/2017 4:35 PM   ??  ??  Right upper extremity EMG from 12/27/2008 was personally reviewed with the patient and demonstrated:  NCV & EMG Findings:  All nerve conduction studies (as indicated in the following tables) were within normal limits. ??  ??  All examined muscles (as indicated in the following table) showed no evidence of electrical instability.????  ??  INTERPRETATION  This was a normal nerve conduction and EMG study showing there to be no signs of neuropathy, myopathy, or radiculopathy in the nerves and muscles tested.??  ??  CLINICAL INTERPRETATION  There are no electrodiagnostic findings correlating with his symptoms.??  ??  Written by Robinette Haines, [place], as dictated by Delrae Sawyers, MD.  I, Dr. Delrae Sawyers confirm that all documentation is accurate.

## 2018-06-13 NOTE — Patient Instructions (Signed)
Low Back Arthritis: Exercises  Introduction  Here are some examples of typical rehabilitation exercises for your condition. Start each exercise slowly. Ease off the exercise if you start to have pain.  Your doctor or physical therapist will tell you when you can start these exercises and which ones will work best for you.  When you are not being active, find a comfortable position for rest. Some people are comfortable on the floor or a medium-firm bed with a small pillow under their head and another under their knees. Some people prefer to lie on their side with a pillow between their knees. Don't stay in one position for too long.  Take short walks (10 to 20 minutes) every 2 to 3 hours. Avoid slopes, hills, and stairs until you feel better. Walk only distances you can manage without pain, especially leg pain.  How to do the exercises  Pelvic tilt   1. Lie on your back with your knees bent.  2. "Brace" your stomach???tighten your muscles by pulling in and imagining your belly button moving toward your spine.  3. Press your lower back into the floor. You should feel your hips and pelvis rock back.  4. Hold for 6 seconds while breathing smoothly.  5. Relax and allow your pelvis and hips to rock forward.  6. Repeat 8 to 12 times.    Back stretches   1. Get down on your hands and knees on the floor.  2. Relax your head and allow it to droop. Round your back up toward the ceiling until you feel a nice stretch in your upper, middle, and lower back. Hold this stretch for as long as it feels comfortable, or about 15 to 30 seconds.  3. Return to the starting position with a flat back while you are on your hands and knees.  4. Let your back sway by pressing your stomach toward the floor. Lift your buttocks toward the ceiling.  5. Hold this position for 15 to 30 seconds.  6. Repeat 2 to 4 times.    Follow-up care is a key part of your treatment and safety. Be sure to make  and go to all appointments, and call your doctor if you are having problems. It's also a good idea to know your test results and keep a list of the medicines you take.  Where can you learn more?  Go to http://www.healthwise.net/[facility]  Enter T094 in the search box to learn more about "Low Back Arthritis: Exercises."  Current as of: June 26, 2019Content Version: 12.4  ?? 2006-2020 [employer].  Care instructions adapted under license by [employer] (which disclaims liability or warranty for this information). If you have questions about a medical condition or this instruction, always ask your healthcare professional. [employer] disclaims any warranty or liability for your use of this information.

## 2018-07-02 ENCOUNTER — Encounter: Attending: Specialist | Primary: Family Medicine

## 2018-07-11 ENCOUNTER — Ambulatory Visit
Admit: 2018-07-11 | Discharge: 2018-07-11 | Payer: PRIVATE HEALTH INSURANCE | Attending: Nurse Practitioner | Primary: Family Medicine

## 2018-07-11 ENCOUNTER — Ambulatory Visit: Attending: Nurse Practitioner | Primary: Family Medicine

## 2018-07-11 DIAGNOSIS — M5416 Radiculopathy, lumbar region: Secondary | ICD-10-CM

## 2018-07-11 NOTE — Progress Notes (Signed)
Jim Shaw presents today for   Chief Complaint   Patient presents with   . Back Pain       Is someone accompanying this pt? no    Is the patient using any DME equipment during OV? Yes, cane        Coordination of Care:  1. Have you been to the ER, urgent care clinic since your last visit? no  Hospitalized since your last visit? no    2. Have you seen or consulted any other health care providers outside of the [facility] since your last visit? no Include any pap smears or colon screening. no
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
## 2018-07-11 NOTE — Progress Notes (Signed)
Rodel Dewayne Lust presents today for   Chief Complaint   Patient presents with   ??? Back Pain       Is someone accompanying this pt? no    Is the patient using any DME equipment during OV? Yes, cane        Coordination of Care:  1. Have you been to the ER, urgent care clinic since your last visit? no  Hospitalized since your last visit? no    2. Have you seen or consulted any other health care providers outside of the  Health System since your last visit? no Include any pap smears or colon screening. no

## 2018-07-11 NOTE — Patient Instructions (Signed)
Neck Spasm: Exercises  Introduction  Here are some examples of exercises for you to try. The exercises may be suggested for a condition or for rehabilitation. Start each exercise slowly. Ease off the exercises if you start to have pain.  You will be told when to start these exercises and which ones will work best for you.  How to do the exercises  Levator scapula stretch   1. Sit in a firm chair, or stand up straight.  2. Gently tilt your head toward your left shoulder.  3. Turn your head to look down into your armpit, bending your head slightly forward. Let the weight of your head stretch your neck muscles.  4. Hold for 15 to 30 seconds.  5. Return to your starting position.  6. Follow the same instructions above, but tilt your head toward your right shoulder.  7. Repeat 2 to 4 times toward each shoulder.    Upper trapezius stretch   1. Sit in a firm chair, or stand up straight.  2. This stretch works best if you keep your shoulder down as you lean away from it. To help you remember to do this, start by relaxing your shoulders and lightly holding on to your thighs or your chair.  3. Tilt your head toward your shoulder and hold for 15 to 30 seconds. Let the weight of your head stretch your muscles.  4. If you would like a little added stretch, place your arm behind your back. Use the arm opposite of the direction you are tilting your head. For example, if you are tilting your head to the left, place your right arm behind your back.  5. Repeat 2 to 4 times toward each shoulder.    Neck rotation   1. Sit in a firm chair, or stand up straight.  2. Keeping your chin level, turn your head to the right, and hold for 15 to 30 seconds.  3. Turn your head to the left, and hold for 15 to 30 seconds.  4. Repeat 2 to 4 times to each side.    Chin tuck   1. Lie on the floor with a rolled-up towel under your neck. Your head should be touching the floor.  2. Slowly bring your chin toward the front of your neck.   3. Hold for a count of 6, and then relax for up to 10 seconds.  4. Repeat 8 to 12 times.    Forward neck flexion   1. Sit in a firm chair, or stand up straight.  2. Bend your head forward.  3. Hold for 15 to 30 seconds, then return to your starting position.  4. Repeat 2 to 4 times.    Follow-up care is a key part of your treatment and safety. Be sure to make and go to all appointments, and call your doctor if you are having problems. It's also a good idea to know your test results and keep a list of the medicines you take.  Where can you learn more?  Go to http://www.healthwise.net/[facility]  Enter P962 in the search box to learn more about "Neck Spasm: Exercises."  Current as of: March 26, 2018??????????????????????????????Content Version: 12.5  ?? 2006-2020 [employer].   Care instructions adapted under license by [employer] (which disclaims liability or warranty for this information). If you have questions about a medical condition or this instruction, always ask your healthcare professional. [employer], Inc[employer] disclaims any warranty or liability for your use of this information.
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
## 2018-07-20 ENCOUNTER — Ambulatory Visit
Admit: 2018-07-20 | Discharge: 2018-07-20 | Payer: PRIVATE HEALTH INSURANCE | Attending: Specialist | Primary: Family Medicine

## 2018-07-20 ENCOUNTER — Ambulatory Visit: Attending: Specialist | Primary: Family Medicine

## 2018-07-20 DIAGNOSIS — M25561 Pain in right knee: Secondary | ICD-10-CM

## 2018-07-20 DIAGNOSIS — G8929 Other chronic pain: Secondary | ICD-10-CM

## 2018-07-20 MED ORDER — BETAMETHASONE ACET & SOD PHOS 6 MG/ML SUSP FOR INJECTION
6 mg/mL | Freq: Once | INTRAMUSCULAR | 0 refills | Status: AC
Start: 2018-07-20 — End: 2018-07-20
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
## 2018-07-20 NOTE — Progress Notes (Signed)
Pt states that PT was not helpful, but aquatic PT was. Is signed up to do another round. Pt wears knee brace which helps a little.

## 2018-07-20 NOTE — Progress Notes (Signed)
Verbal order given by Dr. Blasdell to draw up 4 mL 0.25% Sensorcaine and 0.5 mL 30 mg/5mL Betamethasone X 2.
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
## 2018-07-20 NOTE — Progress Notes (Signed)
Pt states that PT was not helpful, but aquatic PT was. Is signed up to do another round. Pt wears knee brace which helps a little.

## 2018-07-30 ENCOUNTER — Inpatient Hospital Stay: Admit: 2018-07-30 | Payer: MEDICAID | Primary: Family Medicine

## 2018-07-30 DIAGNOSIS — M5416 Radiculopathy, lumbar region: Secondary | ICD-10-CM
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
## 2018-07-30 NOTE — Progress Notes (Signed)
 PT DAILY TREATMENT NOTE 10-18    Patient Name: Jim Shaw  Date:07/30/2018  DOB: Feb 24, 1970  [x]   Patient DOB Verified  Payor: [place]ICA[place] / [place]: [place] / Product Type: Managed Care Medicaid /    In time:100  Out time:155  Total Treatment Time (min): 55  Visit #: 1 of 8    Medicare/BCBS Only   Total Timed Codes (min):  na 1:1 Treatment Time:  na       Treatment Area: Radiculopathy, lumbar region [M54.16]  Spinal stenosis, cervical region [M48.02]  Pain in right knee [M25.561]  Other chronic pain [G89.29]  Pain in left hip [M25.552]    SUBJECTIVE  Pain Level (0-10 scale): 8  Any medication changes, allergies to medications, adverse drug reactions, diagnosis change, or new procedure performed?: [x]  No    []  Yes (see summary sheet for update)  Subjective functional status/changes:   []  No changes reported  Pt initial eval today see POC for details    OBJECTIVE      45 min [x] Eval                  [] Re-Eval       10 NC min Therapeutic Exercise:  []  See flow sheet :   Rationale: increase ROM and increase strength to improve the patient's ability to walk, transfer, stand            With   []  TE   []  TA   []  neuro   []  other: Patient Education: [x]  Review HEP    []  Progressed/Changed HEP based on:   []  positioning   []  body mechanics   []  transfers   []  heat/ice application    []  other:      Other Objective/Functional Measures:   Justification for Eval Code Complexity:  Patient History : see POC   Examination see exam   Clinical Presentation: evolving  Clinical Decision Making : FOTO : 36/100       Pain Level (0-10 scale) post treatment: 8    ASSESSMENT/Changes in Function: Pt was instructed in initial HEP required demo, vc, and tc for all exercises to perform correctly. Pt was given hand out detailing exercises and instructed in modification of exercises to tolerance, and in performing exercises safely. Pt verbalized understanding.       Patient will continue to benefit from skilled PT  services to modify and progress therapeutic interventions, address functional mobility deficits, address ROM deficits, address strength deficits, analyze and address soft tissue restrictions, analyze and cue movement patterns, analyze and modify body mechanics/ergonomics, assess and modify postural abnormalities, address imbalance/dizziness and instruct in home and community integration to attain remaining goals.     [x]   See Plan of Care  []   See progress note/recertification  []   See Discharge Summary         Progress towards goals / Updated goals:  No progress as goals were set today    PLAN  [x]   Upgrade activities as tolerated     [x]   Continue plan of care  []   Update interventions per flow sheet       []   Discharge due to:_  []   Other:_      Larraine Puffer 07/30/2018  12:49 PM    Future Appointments   Date Time Provider Department Center   07/30/2018  1:00 PM [facility]   08/01/2018  9:00 AM Tanda Carne A, DO [facility]   08/22/2018  9:15 AM Leonce Zebedee MATSU, MD [facility] SCHED

## 2018-07-30 NOTE — Progress Notes (Signed)
PT DAILY TREATMENT NOTE 10-18    Patient Name: Jim HickmanChristopher Dewayne Shaw  Date:07/30/2018  DOB: 26-Jul-1970  [x]   Patient DOB Verified  Payor: [place] / [place] / Product Type: Managed Care Medicaid /    In time:100  Out time:155  Total Treatment Time (min): 55  Visit #: 1 of 8    Medicare/BCBS Only   Total Timed Codes (min):  na 1:1 Treatment Time:  na       Treatment Area: Radiculopathy, lumbar region [M54.16]  Spinal stenosis, cervical region [M48.02]  Pain in right knee [M25.561]  Other chronic pain [G89.29]  Pain in left hip [M25.552]    SUBJECTIVE  Pain Level (0-10 scale): 8  Any medication changes, allergies to medications, adverse drug reactions, diagnosis change, or new procedure performed?: [x]  No    []  Yes (see summary sheet for update)  Subjective functional status/changes:   []  No changes reported  Pt initial eval today see POC for details    OBJECTIVE      45 min [x] Eval                  [] Re-Eval       10 NC min Therapeutic Exercise:  []  See flow sheet :   Rationale: increase ROM and increase strength to improve the patient???s ability to walk, transfer, stand            With   []  TE   []  TA   []  neuro   []  other: Patient Education: [x]  Review HEP    []  Progressed/Changed HEP based on:   []  positioning   []  body mechanics   []  transfers   []  heat/ice application    []  other:      Other Objective/Functional Measures:   Justification for Eval Code Complexity:  Patient History : see POC   Examination see exam   Clinical Presentation: evolving  Clinical Decision Making : FOTO : 36/100       Pain Level (0-10 scale) post treatment: 8    ASSESSMENT/Changes in Function: Pt was instructed in initial HEP required demo, vc, and tc for all exercises to perform correctly. Pt was given hand out detailing exercises and instructed in modification of exercises to tolerance, and in performing exercises safely. Pt verbalized understanding.        Patient will continue to benefit from skilled PT services to modify and progress therapeutic interventions, address functional mobility deficits, address ROM deficits, address strength deficits, analyze and address soft tissue restrictions, analyze and cue movement patterns, analyze and modify body mechanics/ergonomics, assess and modify postural abnormalities, address imbalance/dizziness and instruct in home and community integration to attain remaining goals.     [x]   See Plan of Care  []   See progress note/recertification  []   See Discharge Summary         Progress towards goals / Updated goals:  No progress as goals were set today    PLAN  [x]   Upgrade activities as tolerated     [x]   Continue plan of care  []   Update interventions per flow sheet       []   Discharge due to:_  []   Other:_      Cora CollumKelsey Mili Piltz 07/30/2018  12:49 PM    Future Appointments   Date Time Provider Department Center   07/30/2018  1:00 [facility]   08/01/2018  9:00 AM Spero GeraldsWilson, Shawn A, DO [facility]   08/22/2018  9:15 AM Tobie Lords, MD [facility] SCHED
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
## 2018-08-01 ENCOUNTER — Ambulatory Visit
Admit: 2018-08-01 | Discharge: 2018-08-01 | Payer: PRIVATE HEALTH INSURANCE | Attending: Orthopaedic Surgery | Primary: Family Medicine

## 2018-08-01 ENCOUNTER — Ambulatory Visit: Attending: Orthopaedic Surgery | Primary: Family Medicine

## 2018-08-01 DIAGNOSIS — G5601 Carpal tunnel syndrome, right upper limb: Secondary | ICD-10-CM

## 2018-08-01 NOTE — Progress Notes (Signed)
Jim Shaw is a 48 y.o. male right handed unknown employment.  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    Vitals:    08/01/18 0842   BP: 119/81   Pulse: 68   Resp: 16   Temp: 96.9 ??F (36.1 ??C)   TempSrc: Oral   SpO2: 95%   Weight: 288 lb 3.2 oz (130.7 kg)   Height: 6\' 1"  (1.854 m)   PainSc:   8   PainLoc: Hand           Chief Complaint   Patient presents with   ??? Hand Pain     bil hand pain       HPI: Follow-up for bilateral hand numbness and right index finger locking s/p right index finger A1 Pulley injection and right carpal tunnel injection    Initial HPI: Patient comes in today with a history of right hand numbness and tingling as well as pain and locking in his right index finger.    Date of onset:  indeterminate    Injury: No    Prior Treatment:  Yes: Comment: right index finger A1 Pulley injection and right carpal tunnel injection    Numbness/ Tingling: Yes: Comment: right hand    ROS: Review of Systems - General ROS: negative  Psychological ROS: negative  Allergy and Immunology ROS: negative  Hematological and Lymphatic ROS: negative  Respiratory ROS: no cough, shortness of breath, or wheezing  Cardiovascular ROS: no chest pain or dyspnea on exertion  Gastrointestinal ROS: no abdominal pain, change in bowel habits, or black or bloody stools  Musculoskeletal ROS: positive for - pain in hand - right  Neurological ROS: positive for - numbness/tingling  Dermatological ROS: negative    Past Medical History:   Diagnosis Date   ??? Costochondritis    ??? Major depressive disorder 11/08/2017   ??? Psychiatric disorder        Past Surgical History:   Procedure Laterality Date   ??? HX ORTHOPAEDIC      Surgery on knee (right knee)        Current Outpatient Medications   Medication Sig Dispense Refill   ??? eszopiclone (LUNESTA) 3 mg tablet Take  by mouth nightly.     ??? chlorzoxazone (Parafon Forte DSC) 500 mg tablet Take 1 tab by mouth BID-TID as needed for muscle spasm. 60 Tab 1   ???  pregabalin (Lyrica) 75 mg capsule Take 1-2 caps by mouth two (2) times daily as directed. Max Daily Amount: 300 mg.  Indications: disorder characterized by stiff, tender & painful muscles 120 Cap 1   ??? diazePAM (VALIUM) 10 mg tablet TK 1 T PO  QHS     ??? lamoTRIgine (LaMICtal) 25 mg tablet Take 100 mg by mouth nightly.     ??? celecoxib (CeleBREX) 200 mg capsule Take 1 Cap by mouth two (2) times daily (with meals). 60 Cap 2   ??? celecoxib (CELEBREX) 100 mg capsule Take 1 Cap by mouth daily.     ??? pregabalin (LYRICA) 75 mg capsule Take 1 Cap by mouth two (2) times a day. Max Daily Amount: 150 mg. 28 Cap 0   ??? prazosin (MINIPRESS) 1 mg capsule TK 1 C PO QHS     ??? DULoxetine (CYMBALTA) 60 mg capsule TK ONE C PO D     ??? ramelteon (ROZEREM) 8 mg tablet TK 1 T PO HS     ??? risperiDONE (RisperDAL) 1 mg tablet TK 2 TS PO HS     ???  tiZANidine (ZANAFLEX) 4 mg tablet Take 1 tab by mouth BID-TID as needed for muscle spasm. 60 Tab 1   ??? mirtazapine (REMERON) 15 mg tablet TK 1 T PO HS     ??? triamcinolone acetonide (KENALOG) 0.1 % ointment      ??? fluticasone propionate (FLONASE) 50 mcg/actuation nasal spray 2 Sprays by Both Nostrils route daily.     ??? ciclopirox (PENLAC) 8 % solution Apply  to affected area nightly.     ??? tiZANidine (ZANAFLEX) 4 mg tablet Take 1 Tab by mouth three (3) times daily. 90 Tab 1   ??? pregabalin (LYRICA) 100 mg capsule Take 1 Cap by mouth three (3) times daily. Max Daily Amount: 300 mg. (Patient not taking: Reported on 05/23/2018) 90 Cap 1   ??? gabapentin (NEURONTIN) 600 mg tablet Take 600 mg by mouth three (3) times daily.     ??? hydrOXYzine HCl (ATARAX) 50 mg tablet Take 50 mg by mouth as needed for Anxiety.     ??? venlafaxine-SR (EFFEXOR-XR) 150 mg capsule Take 1 Cap by mouth daily (with breakfast). Indications: Anxiousness associated with Depression 15 Cap 1   ??? QUEtiapine (SEROQUEL) 50 mg tablet Take 1 Tab by mouth nightly. Indications: Additional Medications to Treat Depression (Patient not taking: Reported  on 05/23/2018) 1 Tab 0       Allergies   Allergen Reactions   ??? Oxycodone-Acetaminophen Anaphylaxis and Swelling   ??? Duloxetine Hcl Rash   ??? Aspirin Other (comments)   ??? Cortisone Nausea and Vomiting     Also rash    ??? Ibuprofen Rash         PE:     Physical Exam  Vitals signs and nursing note reviewed.   Constitutional:       General: He is not in acute distress.     Appearance: Normal appearance. He is not ill-appearing.   Eyes:      Pupils: Pupils are equal, round, and reactive to light.   Neck:      Musculoskeletal: Normal range of motion.   Cardiovascular:      Pulses: Normal pulses.   Pulmonary:      Effort: Pulmonary effort is normal. No respiratory distress.   Abdominal:      General: Abdomen is flat.   Musculoskeletal: Normal range of motion.         General: Tenderness present. No swelling, deformity or signs of injury.      Right lower leg: No edema.      Left lower leg: No edema.   Skin:     General: Skin is warm and dry.      Capillary Refill: Capillary refill takes less than 2 seconds.      Findings: No bruising or erythema.   Neurological:      General: No focal deficit present.      Mental Status: He is alert and oriented to person, place, and time.      Cranial Nerves: No cranial nerve deficit.      Sensory: No sensory deficit.   Psychiatric:         Mood and Affect: Mood normal.         Behavior: Behavior normal.         NEUROVASCULAR    Examination L R Examination L R   Carpal Comp. - + Pronator Comp. - -   Carpal Tinel - + Pronator Tinel - -   Phalen's - + Pronator Stress - -  Cubital Comp. - - Guyon Comp. - -   Cubital Tinel - - Guyon Tinel - -   Elbow Hyperflexion - - Adson's - -   Spurling's - - SC Comp. - -   PCB Median abn - - SC Tinel - -   Radial Tinel - - IC Comp. - -   Digital Tinel - - IC Tinel - -   Radial 2-Pt WNL WNL Ulnar 2-Pt WNL WNL     Radial Pulse: 2+  Capillary Refill: < 2 sec  Allen: Not Performed  Civil Service fast streamerDigital Allen: Not Performed    Hand:    Examination L Digit(s) R Digit(s)    1st CMC Tenderness -  -    1st CMC Grind -  -    Bouchard Nodes -  -    Heberden Nodes -  -    A1 Pulley Tenderness -  + IF   Triggering -  + IF   UCL Instability -  -    RCL Instability -  -    Lateral Stress Pain -  -    Palmar Cords -  -    Tabletop test -  -    Garrod's Pads -  -    Grip Strength       Pinch Strength         ROM: Full      Imaging: none indicated    02/2018 repeat EMG  NCS/EMG FINDINGS:  ?? Evaluation of the Left median motor, the Right median motor, the Left peroneal motor, the Right peroneal motor, the Left tibial motor, the Right tibial motor, the Left ulnar motor, the Right ulnar motor, the Left superficial peroneal sensory, the Right superficial peroneal sensory, the Left sural sensory, and the Right sural sensory nerves were unremarkable.  ?? The Left Median 2nd Digit sensory nerve showed reduced amplitude (12.5 ??V).  ?? The Right Median 2nd Digit sensory nerve showed prolonged distal peak latency (3.6 ms) and reduced amplitude (12.4 ??V).  ?? The Left ulnar sensory and the Right ulnar sensory nerves showed reduced amplitude (L8.6, R6.3 ??V) and decreased conduction velocity (Wrist-5th Digit, L45.8, R47.8 m/s).  ??  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   3-Normal EMG of RLE muscles.       12/2017  NCV & EMG Findings:  All nerve conduction studies (as indicated in the following tables) were within normal limits.    ??  All examined muscles (as indicated in the following table) showed no evidence of electrical instability.    ??  INTERPRETATION  This was a normal nerve conduction and EMG study showing there to be no signs of neuropathy, myopathy, or radiculopathy in the nerves and muscles tested.   ??  CLINICAL INTERPRETATION  There are no electrodiagnostic findings correlating with his symptoms.       ICD-10-CM ICD-9-CM    1. Right carpal tunnel syndrome  G56.01 354.0    2. Trigger index finger of right hand M65.321 727.03    3. Cervical stenosis of spinal canal M48.02 723.0        Plan:     Discussed surgery at length and patient would like to have it but unsure of when given his present living situation.  Also discussed the likelihood that all of his symptoms will not be resolved with the carpal tunnel release as he likely has  symptoms coming from his neck.    The patient was counseled at length about the risks of contracting Covid-19 during their perioperative period and any recovery window from their procedure.  The patient was made aware that contracting Covid-19  may worsen their prognosis for recovering from their procedure and lend to a higher morbidity and/or mortality risk.  All material risks, benefits, and reasonable alternatives including postponing the procedure were discussed. The patient does  wish to proceed with the procedure at this time.      Plan was reviewed with patient, who verbalized agreement and understanding of the plan

## 2018-08-01 NOTE — Progress Notes (Signed)
Jim Shaw is a 48 y.o. male right handed unknown employment.  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    Vitals:    08/01/18 0842   BP: 119/81   Pulse: 68   Resp: 16   Temp: 96.9 ??F (36.1 ??C)   TempSrc: Oral   SpO2: 95%   Weight: 288 lb 3.2 oz (130.7 kg)   Height: 6\' 1"  (1.854 m)   PainSc:   8   PainLoc: Hand           Chief Complaint   Patient presents with   ??? Hand Pain     bil hand pain       HPI: Follow-up for bilateral hand numbness and right index finger locking s/p right index finger A1 Pulley injection and right carpal tunnel injection    Initial HPI: Patient comes in today with a history of right hand numbness and tingling as well as pain and locking in his right index finger.    Date of onset:  indeterminate    Injury: No    Prior Treatment:  Yes: Comment: right index finger A1 Pulley injection and right carpal tunnel injection    Numbness/ Tingling: Yes: Comment: right hand    ROS: Review of Systems - General ROS: negative  Psychological ROS: negative  Allergy and Immunology ROS: negative  Hematological and Lymphatic ROS: negative  Respiratory ROS: no cough, shortness of breath, or wheezing  Cardiovascular ROS: no chest pain or dyspnea on exertion  Gastrointestinal ROS: no abdominal pain, change in bowel habits, or black or bloody stools  Musculoskeletal ROS: positive for - pain in hand - right  Neurological ROS: positive for - numbness/tingling  Dermatological ROS: negative    Past Medical History:   Diagnosis Date   ??? Costochondritis    ??? Major depressive disorder 11/08/2017   ??? Psychiatric disorder        Past Surgical History:   Procedure Laterality Date   ??? HX ORTHOPAEDIC      Surgery on knee (right knee)        Current Outpatient Medications   Medication Sig Dispense Refill   ??? eszopiclone (LUNESTA) 3 mg tablet Take  by mouth nightly.     ??? chlorzoxazone (Parafon Forte DSC) 500 mg tablet Take 1 tab by mouth BID-TID as needed for muscle spasm. 60 Tab 1    ??? pregabalin (Lyrica) 75 mg capsule Take 1-2 caps by mouth two (2) times daily as directed. Max Daily Amount: 300 mg.  Indications: disorder characterized by stiff, tender & painful muscles 120 Cap 1   ??? diazePAM (VALIUM) 10 mg tablet TK 1 T PO  QHS     ??? lamoTRIgine (LaMICtal) 25 mg tablet Take 100 mg by mouth nightly.     ??? celecoxib (CeleBREX) 200 mg capsule Take 1 Cap by mouth two (2) times daily (with meals). 60 Cap 2   ??? celecoxib (CELEBREX) 100 mg capsule Take 1 Cap by mouth daily.     ??? pregabalin (LYRICA) 75 mg capsule Take 1 Cap by mouth two (2) times a day. Max Daily Amount: 150 mg. 28 Cap 0   ??? prazosin (MINIPRESS) 1 mg capsule TK 1 C PO QHS     ??? DULoxetine (CYMBALTA) 60 mg capsule TK ONE C PO D     ??? ramelteon (ROZEREM) 8 mg tablet TK 1 T PO HS     ??? risperiDONE (RisperDAL) 1 mg tablet TK 2 TS PO HS     ???  tiZANidine (ZANAFLEX) 4 mg tablet Take 1 tab by mouth BID-TID as needed for muscle spasm. 60 Tab 1   ??? mirtazapine (REMERON) 15 mg tablet TK 1 T PO HS     ??? triamcinolone acetonide (KENALOG) 0.1 % ointment      ??? fluticasone propionate (FLONASE) 50 mcg/actuation nasal spray 2 Sprays by Both Nostrils route daily.     ??? ciclopirox (PENLAC) 8 % solution Apply  to affected area nightly.     ??? tiZANidine (ZANAFLEX) 4 mg tablet Take 1 Tab by mouth three (3) times daily. 90 Tab 1   ??? pregabalin (LYRICA) 100 mg capsule Take 1 Cap by mouth three (3) times daily. Max Daily Amount: 300 mg. (Patient not taking: Reported on 05/23/2018) 90 Cap 1   ??? gabapentin (NEURONTIN) 600 mg tablet Take 600 mg by mouth three (3) times daily.     ??? hydrOXYzine HCl (ATARAX) 50 mg tablet Take 50 mg by mouth as needed for Anxiety.     ??? venlafaxine-SR (EFFEXOR-XR) 150 mg capsule Take 1 Cap by mouth daily (with breakfast). Indications: Anxiousness associated with Depression 15 Cap 1   ??? QUEtiapine (SEROQUEL) 50 mg tablet Take 1 Tab by mouth nightly. Indications: Additional Medications to Treat Depression (Patient not  taking: Reported on 05/23/2018) 1 Tab 0       Allergies   Allergen Reactions   ??? Oxycodone-Acetaminophen Anaphylaxis and Swelling   ??? Duloxetine Hcl Rash   ??? Aspirin Other (comments)   ??? Cortisone Nausea and Vomiting     Also rash    ??? Ibuprofen Rash         PE:     Physical Exam  Vitals signs and nursing note reviewed.   Constitutional:       General: He is not in acute distress.     Appearance: Normal appearance. He is not ill-appearing.   Eyes:      Pupils: Pupils are equal, round, and reactive to light.   Neck:      Musculoskeletal: Normal range of motion.   Cardiovascular:      Pulses: Normal pulses.   Pulmonary:      Effort: Pulmonary effort is normal. No respiratory distress.   Abdominal:      General: Abdomen is flat.   Musculoskeletal: Normal range of motion.         General: Tenderness present. No swelling, deformity or signs of injury.      Right lower leg: No edema.      Left lower leg: No edema.   Skin:     General: Skin is warm and dry.      Capillary Refill: Capillary refill takes less than 2 seconds.      Findings: No bruising or erythema.   Neurological:      General: No focal deficit present.      Mental Status: He is alert and oriented to person, place, and time.      Cranial Nerves: No cranial nerve deficit.      Sensory: No sensory deficit.   Psychiatric:         Mood and Affect: Mood normal.         Behavior: Behavior normal.         NEUROVASCULAR    Examination L R Examination L R   Carpal Comp. - + Pronator Comp. - -   Carpal Tinel - + Pronator Tinel - -   Phalen's - + Pronator Stress - -  Cubital Comp. - - Guyon Comp. - -   Cubital Tinel - - Guyon Tinel - -   Elbow Hyperflexion - - Adson's - -   Spurling's - - SC Comp. - -   PCB Median abn - - SC Tinel - -   Radial Tinel - - IC Comp. - -   Digital Tinel - - IC Tinel - -   Radial 2-Pt WNL WNL Ulnar 2-Pt WNL WNL     Radial Pulse: 2+  Capillary Refill: < 2 sec  Allen: Not Performed  Civil Service fast streamerDigital Allen: Not Performed    Hand:     Examination L Digit(s) R Digit(s)   1st CMC Tenderness -  -    1st CMC Grind -  -    Bouchard Nodes -  -    Heberden Nodes -  -    A1 Pulley Tenderness -  + IF   Triggering -  + IF   UCL Instability -  -    RCL Instability -  -    Lateral Stress Pain -  -    Palmar Cords -  -    Tabletop test -  -    Garrod's Pads -  -    Grip Strength       Pinch Strength         ROM: Full      Imaging: none indicated    02/2018 repeat EMG  NCS/EMG FINDINGS:  ?? Evaluation of the Left median motor, the Right median motor, the Left peroneal motor, the Right peroneal motor, the Left tibial motor, the Right tibial motor, the Left ulnar motor, the Right ulnar motor, the Left superficial peroneal sensory, the Right superficial peroneal sensory, the Left sural sensory, and the Right sural sensory nerves were unremarkable.  ?? The Left Median 2nd Digit sensory nerve showed reduced amplitude (12.5 ??V).  ?? The Right Median 2nd Digit sensory nerve showed prolonged distal peak latency (3.6 ms) and reduced amplitude (12.4 ??V).  ?? The Left ulnar sensory and the Right ulnar sensory nerves showed reduced amplitude (L8.6, R6.3 ??V) and decreased conduction velocity (Wrist-5th Digit, L45.8, R47.8 m/s).  ??  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   3-Normal EMG of RLE muscles.       12/2017  NCV & EMG Findings:  All nerve conduction studies (as indicated in the following tables) were within normal limits.    ??  All examined muscles (as indicated in the following table) showed no evidence of electrical instability.    ??  INTERPRETATION  This was a normal nerve conduction and EMG study showing there to be no signs of neuropathy, myopathy, or radiculopathy in the nerves and muscles tested.   ??  CLINICAL INTERPRETATION  There are no electrodiagnostic findings correlating with his symptoms.       ICD-10-CM ICD-9-CM     1. Right carpal tunnel syndrome G56.01 354.0    2. Trigger index finger of right hand M65.321 727.03    3. Cervical stenosis of spinal canal M48.02 723.0        Plan:     Discussed surgery at length and patient would like to have it but unsure of when given his present living situation.  Also discussed the likelihood that all of his symptoms will not be resolved with the carpal tunnel release as he likely has  symptoms coming from his neck.    The patient was counseled at length about the risks of contracting Covid-19 during their perioperative period and any recovery window from their procedure.  The patient was made aware that contracting Covid-19  may worsen their prognosis for recovering from their procedure and lend to a higher morbidity and/or mortality risk.  All material risks, benefits, and reasonable alternatives including postponing the procedure were discussed. The patient does  wish to proceed with the procedure at this time.      Plan was reviewed with patient, who verbalized agreement and understanding of the plan

## 2018-08-06 ENCOUNTER — Inpatient Hospital Stay: Admit: 2018-08-06 | Payer: MEDICAID | Primary: Family Medicine

## 2018-08-06 NOTE — Progress Notes (Signed)
PT DAILY TREATMENT NOTE 8-14    Patient Name: Jim HickmanChristopher Dewayne Shaw  Date:08/06/2018  DOB: November 27, 1970  [x]   Patient DOB Verified  Payor: [employer] / [place] [employer] / Product Type: Managed Care Medicaid /    In time: 3:30 pm  Out time: 4:25  Total Treatment Time (min): 55  Total Timed Codes (min): 45  1:1 Treatment Time (min): 45   Visit #: 2 of 8    Treatment Area: Radiculopathy, lumbar region [M54.16]  Spinal stenosis, cervical region [M48.02]  Pain in right knee [M25.561]  Other chronic pain [G89.29]  Pain in left hip [M25.552]    SUBJECTIVE  Pain Level (0-10 scale): 8/10  Any medication changes, allergies to medications, adverse drug reactions, diagnosis change, or new procedure performed?: [x]  No    []  Yes (see summary sheet for update)  Subjective functional status/changes:   []  No changes reported  "My pain is about the same. Nothing really relieves it."    OBJECTIVE  Modality rationale: decrease pain and increase tissue extensibility to improve the patient???s ability to tolerate ambulation, transfers, and ADL's   Min Type Additional Details   10 []  Estim: [] Att   [x] Unatt  [] TENS instruct                 [x] IFC  [] Premod [] NMES                       [] Other:  [] w/US   [] w/ice   [x] w/heat  Position: semi reclined  Location: lumbar spine    []   Traction: []  Cervical       [] Lumbar                       []  Prone          [] Supine                       [] Intermittent   [] Continuous Lbs:  []  before manual  []  after manual    []   Ultrasound: [] Continuous   []  Pulsed                           [] 1MHz   [] 3MHz Location:  W/cm2:    []   Iontophoresis with dexamethasone         Location: []  Take home patch   []  In clinic    []   Ice     []   heat  []   Ice massage Position:  Location:    []   Vasopneumatic Device Pressure: []  lo []  med []  hi   Temp: []  lo []  med []  hi   [x]  Skin assessment post-treatment:  [x] intact [] redness- no adverse reaction       [] redness ??? adverse reaction:       27 min Therapeutic  Exercise:  []  See flow sheet :   Rationale: increase ROM and increase strength to improve the patient???s ability to walk, transfer, stand    8 min Therapeutic Activity:  []   See flow sheet : mini squats with VC and mirror feedback for correct performance   Rationale:      NV min Neuromuscular Re-education:  []   See flow sheet :   Rationale: improve coordination, improve balance and increase proprioception  to improve the patient???s ability to safely ambulate without AD and reduce fall risk     10 min Gait Training:  Gait  in clinic without SPC, side stepping and retro gait in // bars    Rationale:           min Patient Education: [x]  Review HEP    []  Progressed/Changed HEP based on:   encouraged daily compliance of current HEP     Other Objective/Functional Measures:    First FU treatment    Pain Level (0-10 scale) post treatment: 8/10    ASSESSMENT/Changes in Function: Fair tolerance to initial treatment session with patient requiring 100% VC's for all newly introduced exercises. Demo reduced knee flexion AROM on R Edenilson [place] with pain limiting further range of motion. Had single moment of leg "tremor" during mini squats causing leg to appear to buckle, patient able to catch himself in parallel bars. Poor activity tolerance secondary to high pain levels.     Patient will continue to benefit from skilled PT services to modify and progress therapeutic interventions, address functional mobility deficits, address ROM deficits, address strength deficits, analyze and address soft tissue restrictions, analyze and cue movement patterns, analyze and modify body mechanics/ergonomics, assess and modify postural abnormalities, address imbalance/dizziness and instruct in home and community integration to attain remaining goals.     [x]   See Plan of Care  []   See progress note/recertification  []   See Discharge Summary         Progress towards goals / Updated goals:  Short Term Goals:??To be accomplished in 1??weeks:  1) Goal: Patient will  be independent and compliant with HEP in order to progress toward long term goals.  Status at last note/certification: Progressing; fair compliance- will con't to monitor (08/06/18)  Long Term Goals:??To be accomplished in 10??treatments:  1) Goal: Patient will improve FOTO assessment score to 46 pts in order to indicate improved functional abilities.  Status at last note/certification: 36 pts  2) Goal: Patient will improve BIL hip extension to 10 deg for improved posture and gait mechanics  Status at last note/certification: 0 deg  3) Goal: Patient will demonstrate ability to squat to 90 deg hip flexion or better for proper lifting  Status at last note/certification: 096 deg  4) Goal: Patient will report overall at least 65% improvement in function in order to progress toward premorbid status.  Status at last note/certification: n/a  5) Goal: Patient will report ability to walk/stand for 30 mins to buy groceries  Status at last note/certification: 10 mins    PLAN  [x]   Upgrade activities as tolerated     []   Continue plan of care  []   Update interventions per flow sheet       []   Discharge due to:_  []   Other:_  Consider manual HS stretching NV    Almyra Deforest, PT 08/06/2018  7:56 AM

## 2018-08-06 NOTE — Progress Notes (Signed)
PT DAILY TREATMENT NOTE 8-14    Patient Name: Jim Shaw  Date:08/06/2018  DOB: [birth year]  [x]   Patient DOB Verified  Payor: [employer] / [place] / Product Type: Managed Care Medicaid /    In time: 3:30 pm  Out time: 4:25  Total Treatment Time (min): 55  Total Timed Codes (min): 45  1:1 Treatment Time (min): 45   Visit #: 2 of 8    Treatment Area: Radiculopathy, lumbar region [M54.16]  Spinal stenosis, cervical region [M48.02]  Pain in right knee [M25.561]  Other chronic pain [G89.29]  Pain in left hip [M25.552]    SUBJECTIVE  Pain Level (0-10 scale): 8/10  Any medication changes, allergies to medications, adverse drug reactions, diagnosis change, or new procedure performed?: [x]  No    []  Yes (see summary sheet for update)  Subjective functional status/changes:   []  No changes reported  "My pain is about the same. Nothing really relieves it."    OBJECTIVE  Modality rationale: decrease pain and increase tissue extensibility to improve the patient???s ability to tolerate ambulation, transfers, and ADL's   Min Type Additional Details   10 []  Estim: [] Att   [x] Unatt  [] TENS instruct                 [x] IFC  [] Premod [] NMES                       [] Other:  [] w/US   [] w/ice   [x] w/heat  Position: semi reclined  Location: lumbar spine    []   Traction: []  Cervical       [] Lumbar                       []  Prone          [] Supine                       [] Intermittent   [] Continuous Lbs:  []  before manual  []  after manual    []   Ultrasound: [] Continuous   []  Pulsed                           [] 1MHz   [] 3MHz Location:  W/cm2:    []   Iontophoresis with dexamethasone         Location: []  Take home patch   []  In clinic    []   Ice     []   heat  []   Ice massage Position:  Location:    []   Vasopneumatic Device Pressure: []  lo []  med []  hi   Temp: []  lo []  med []  hi   [x]  Skin assessment post-treatment:  [x] intact [] redness- no adverse reaction       [] redness ??? adverse reaction:        27 min Therapeutic Exercise:  []  See flow sheet :   Rationale: increase ROM and increase strength to improve the patient???s ability to walk, transfer, stand    8 min Therapeutic Activity:  []   See flow sheet : mini squats with VC and mirror feedback for correct performance   Rationale:      NV min Neuromuscular Re-education:  []   See flow sheet :   Rationale: improve coordination, improve balance and increase proprioception  to improve the patient???s ability to safely ambulate without AD and reduce fall risk     10 min Gait Training:  Gait  in clinic without SPC, side stepping and retro gait in // bars    Rationale:           min Patient Education: [x]  Review HEP    []  Progressed/Changed HEP based on:   encouraged daily compliance of current HEP     Other Objective/Functional Measures:    First FU treatment    Pain Level (0-10 scale) post treatment: 8/10    ASSESSMENT/Changes in Function: Fair tolerance to initial treatment session with patient requiring 100% VC's for all newly introduced exercises. Demo reduced knee flexion AROM on R LE with pain limiting further range of motion. Had single moment of leg "tremor" during mini squats causing leg to appear to buckle, patient able to catch himself in parallel bars. Poor activity tolerance secondary to high pain levels.     Patient will continue to benefit from skilled PT services to modify and progress therapeutic interventions, address functional mobility deficits, address ROM deficits, address strength deficits, analyze and address soft tissue restrictions, analyze and cue movement patterns, analyze and modify body mechanics/ergonomics, assess and modify postural abnormalities, address imbalance/dizziness and instruct in home and community integration to attain remaining goals.     [x]   See Plan of Care  []   See progress note/recertification  []   See Discharge Summary         Progress towards goals / Updated goals:  Short Term Goals:??To be accomplished in 1??weeks:   1) Goal: Patient will be independent and compliant with HEP in order to progress toward long term goals.  Status at last note/certification: Progressing; fair compliance- will con't to monitor (08/06/18)  Long Term Goals:??To be accomplished in 10??treatments:  1) Goal: Patient will improve FOTO assessment score to 46 pts in order to indicate improved functional abilities.  Status at last note/certification: 36 pts  2) Goal: Patient will improve BIL hip extension to 10 deg for improved posture and gait mechanics  Status at last note/certification: 0 deg  3) Goal: Patient will demonstrate ability to squat to 90 deg hip flexion or better for proper lifting  Status at last note/certification: 259 deg  4) Goal: Patient will report overall at least 65% improvement in function in order to progress toward premorbid status.  Status at last note/certification: n/a  5) Goal: Patient will report ability to walk/stand for 30 mins to buy groceries  Status at last note/certification: 10 mins    PLAN  [x]   Upgrade activities as tolerated     []   Continue plan of care  []   Update interventions per flow sheet       []   Discharge due to:_  []   Other:_  Consider manual HS stretching NV    Almyra Deforest, PT 08/06/2018  7:56 AM

## 2018-08-08 ENCOUNTER — Inpatient Hospital Stay: Admit: 2018-08-08 | Payer: MEDICAID | Primary: Family Medicine

## 2018-08-08 NOTE — Progress Notes (Signed)
PT DAILY TREATMENT NOTE 8-14    Patient Name: Jim Shaw  Date:08/08/2018  DOB: [birth year]  [x]   Patient DOB Verified  Payor: [employer] / [place] [employer] / Product Type: Managed Care Medicaid /    In time: 9:00  Out time: 10:00  Total Treatment Time (min): 60  Total Timed Codes (min): 50  1:1 Treatment Time (min): 50  Visit #: 3 of 8    Treatment Area: Radiculopathy, lumbar region [M54.16]  Spinal stenosis, cervical region [M48.02]  Pain in right knee [M25.561]  Other chronic pain [G89.29]  Pain in left hip [M25.552]    SUBJECTIVE  Pain Level (0-10 scale): 8.5/10  Any medication changes, allergies to medications, adverse drug reactions, diagnosis change, or new procedure performed?: [x]  No    []  Yes (see summary sheet for update)  Subjective functional status/changes:   []  No changes reported  Pt reports increased mm soreness and pain in low back after first FU treatment.     OBJECTIVE  Modality rationale: decrease pain and increase tissue extensibility to improve the patient???s ability to tolerate ambulation, transfers, and ADL's   Min Type Additional Details   10 []  Estim: [] Att   [x] Unatt  [] TENS instruct                 [x] IFC  [] Premod [] NMES                       [] Other:  [] w/US   [] w/ice   [x] w/heat  Position: semi reclined  Location: lumbar spine    []   Traction: []  Cervical       [] Lumbar                       []  Prone          [] Supine                       [] Intermittent   [] Continuous Lbs:  []  before manual  []  after manual    []   Ultrasound: [] Continuous   []  Pulsed                           [] 1MHz   [] 3MHz Location:  W/cm2:    []   Iontophoresis with dexamethasone         Location: []  Take home patch   []  In clinic    []   Ice     []   heat  []   Ice massage Position:  Location:    []   Vasopneumatic Device Pressure: []  lo []  med []  hi   Temp: []  lo []  med []  hi   [x]  Skin assessment post-treatment:  [x] intact [] redness- no adverse reaction       [] redness ??? adverse reaction:        25 min Therapeutic Exercise:  []  See flow sheet :   Rationale: increase ROM and increase strength to improve the patient???s ability to walk, transfer, stand    10 min Therapeutic Activity:  []   See flow sheet : mini squats with VC and mirror feedback for correct performance   Rationale:      5 min Neuromuscular Re-education:  []   See flow sheet : ROM, MSR   Rationale: improve coordination, improve balance and increase proprioception  to improve the patient???s ability to safely ambulate without AD and reduce fall risk     10  min Gait Training:  Gait in clinic without SPC, side stepping and retro gait in // bars    Rationale:           min Patient Education: [x]  Review HEP    []  Progressed/Changed HEP based on:   encouraged daily compliance of current HEP     Other Objective/Functional Measures:    Added incline calf stretch, attempted seated and standing HS stretch with predominant co pain in LB   Added seated hip ABD and ADD    Inc repetitions of isometric exercises    Gait: antalgic, reduced R sided wt shift, trunk flexion, poor heel strike      Pain Level (0-10 scale) post treatment: 8.5/10    ASSESSMENT/Changes in Function: Poor overall activity tolerance with patient reporting increased pain with all exercises, despite modifications or change in positions. Pt's knee "buckled" in an exaggerated manner when attempting MSR with R foot posterior, but able to catch himself on the parallel bars. Required max use of B hands to perform sit to stands, even from elevated surface. Unable to actively flex knee past 30 degrees in semi reclined position today.     Patient will continue to benefit from skilled PT services to modify and progress therapeutic interventions, address functional mobility deficits, address ROM deficits, address strength deficits, analyze and address soft tissue restrictions, analyze and cue movement patterns, analyze and modify body mechanics/ergonomics, assess and modify postural abnormalities,  address imbalance/dizziness and instruct in home and community integration to attain remaining goals.     [x]   See Plan of Care  []   See progress note/recertification  []   See Discharge Summary         Progress towards goals / Updated goals:  Short Term Goals:??To be accomplished in 1??weeks:  1) Goal: Patient will be independent and compliant with HEP in order to progress toward long term goals.  Status at last note/certification: Progressing; fair compliance- will con't to monitor (08/06/18)   Long Term Goals:??To be accomplished in 10??treatments:  1) Goal: Patient will improve FOTO assessment score to 46 pts in order to indicate improved functional abilities.  Status at last note/certification: 36 pts  2) Goal: Patient will improve BIL hip extension to 10 deg for improved posture and gait mechanics  Status at last note/certification: 0 deg  3) Goal: Patient will demonstrate ability to squat to 90 deg hip flexion or better for proper lifting  Status at last note/certification: 130 deg  4) Goal: Patient will report overall at least 65% improvement in function in order to progress toward premorbid status.  Status at last note/certification: n/a  5) Goal: Patient will report ability to walk/stand for 30 mins to buy groceries  Status at last note/certification: Progressing: 15 minutes (08/08/18)     PLAN  [x]   Upgrade activities as tolerated     []   Continue plan of care  []   Update interventions per flow sheet       []   Discharge due to:_  []   Other:_      Lanell MatarJaclyn Powell, PT 08/08/2018  7:56 AM

## 2018-08-08 NOTE — Progress Notes (Signed)
PT DAILY TREATMENT NOTE 8-14    Patient Name: Jim Shaw  Date:08/08/2018  DOB: [birth year]  [x]   Patient DOB Verified  Payor: [employer] / [place] [employer] [place] / Product Type: Managed Care Medicaid /    In time: 9:00  Out time: 10:00  Total Treatment Time (min): 60  Total Timed Codes (min): 50  1:1 Treatment Time (min): 50  Visit #: 3 of 8    Treatment Area: Radiculopathy, lumbar region [M54.16]  Spinal stenosis, cervical region [M48.02]  Pain in right knee [M25.561]  Other chronic pain [G89.29]  Pain in left hip [M25.552]    SUBJECTIVE  Pain Level (0-10 scale): 8.5/10  Any medication changes, allergies to medications, adverse drug reactions, diagnosis change, or new procedure performed?: [x]  No    []  Yes (see summary sheet for update)  Subjective functional status/changes:   []  No changes reported  Pt reports increased mm soreness and pain in low back after first FU treatment.     OBJECTIVE  Modality rationale: decrease pain and increase tissue extensibility to improve the patient???s ability to tolerate ambulation, transfers, and ADL's   Min Type Additional Details   10 []  Estim: [] Att   [x] Unatt  [] TENS instruct                 [x] IFC  [] Premod [] NMES                       [] Other:  [] w/US   [] w/ice   [x] w/heat  Position: semi reclined  Location: lumbar spine    []   Traction: []  Cervical       [] Lumbar                       []  Prone          [] Supine                       [] Intermittent   [] Continuous Lbs:  []  before manual  []  after manual    []   Ultrasound: [] Continuous   []  Pulsed                           [] 1MHz   [] 3MHz Location:  W/cm2:    []   Iontophoresis with dexamethasone         Location: []  Take home patch   []  In clinic    []   Ice     []   heat  []   Ice massage Position:  Location:    []   Vasopneumatic Device Pressure: []  lo []  med []  hi   Temp: []  lo []  med []  hi   [x]  Skin assessment post-treatment:  [x] intact [] redness- no adverse reaction       [] redness ??? adverse reaction:        25 min Therapeutic Exercise:  []  See flow sheet :   Rationale: increase ROM and increase strength to improve the patient???s ability to walk, transfer, stand    10 min Therapeutic Activity:  []   See flow sheet : mini squats with VC and mirror feedback for correct performance   Rationale:      5 min Neuromuscular Re-education:  []   See flow sheet : ROM, MSR   Rationale: improve coordination, improve balance and increase proprioception  to improve the patient???s ability to safely ambulate without AD and reduce fall risk     10  min Gait Training:  Gait in clinic without SPC, side stepping and retro gait in // bars    Rationale:           min Patient Education: [x] Review HEP    [] Progressed/Changed HEP based on:   encouraged daily compliance of current HEP     Other Objective/Functional Measures:    Added incline calf stretch, attempted seated and standing HS stretch with predominant co pain in LB   Added seated hip ABD and ADD    Inc repetitions of isometric exercises    Gait: antalgic, reduced R sided wt shift, trunk flexion, poor heel strike      Pain Level (0-10 scale) post treatment: 8.5/10    ASSESSMENT/Changes in Function: Poor overall activity tolerance with patient reporting increased pain with all exercises, despite modifications or change in positions. Pt's knee "buckled" in an exaggerated manner when attempting MSR with R foot posterior, but able to catch himself on the parallel bars. Required max use of B hands to perform sit to stands, even from elevated surface. Unable to actively flex knee past 30 degrees in semi reclined position today.     Patient will continue to benefit from skilled PT services to modify and progress therapeutic interventions, address functional mobility deficits, address ROM deficits, address strength deficits, analyze and address soft tissue restrictions, analyze and cue movement patterns, analyze and modify body mechanics/ergonomics, assess and modify postural abnormalities,  address imbalance/dizziness and instruct in home and community integration to attain remaining goals.     [x]  See Plan of Care  []  See progress note/recertification  []  See Discharge Summary         Progress towards goals / Updated goals:  Short Term Goals:??To be accomplished in 1??weeks:  1) Goal: Patient will be independent and compliant with HEP in order to progress toward long term goals.  Status at last note/certification: Progressing; fair compliance- will con't to monitor (08/06/18)   Long Term Goals:??To be accomplished in 10??treatments:  1) Goal: Patient will improve FOTO assessment score to 46 pts in order to indicate improved functional abilities.  Status at last note/certification: 36 pts  2) Goal: Patient will improve BIL hip extension to 10 deg for improved posture and gait mechanics  Status at last note/certification: 0 deg  3) Goal: Patient will demonstrate ability to squat to 90 deg hip flexion or better for proper lifting  Status at last note/certification: 130 deg  4) Goal: Patient will report overall at least 65% improvement in function in order to progress toward premorbid status.  Status at last note/certification: n/a  5) Goal: Patient will report ability to walk/stand for 30 mins to buy groceries  Status at last note/certification: Progressing: 15 minutes (08/08/18)     PLAN  [x]  Upgrade activities as tolerated     []  Continue plan of care  []  Update interventions per flow sheet       []  Discharge due to:_  []  Other:_      Monicka Cyran, PT 08/08/2018  7:56 AM

## 2018-08-15 ENCOUNTER — Inpatient Hospital Stay: Admit: 2018-08-15 | Payer: MEDICAID | Primary: Family Medicine

## 2018-08-15 NOTE — Progress Notes (Signed)
PT DAILY TREATMENT NOTE 8-14    Patient Name: Jim Shaw  Date:08/15/2018  DOB: 1970/10/08  [x]   Patient DOB Verified  Payor: [employer] / [place] [employer] [place] / Product Type: Managed Care Medicaid /    In time: 9:01 am  Out time: 10:00  Total Treatment Time (min): 59  Total Timed Codes (min): 49  1:1 Treatment Time (min): 49  Visit #: 4 of 8    Treatment Area: Radiculopathy, lumbar region [M54.16]  Spinal stenosis, cervical region [M48.02]  Pain in right knee [M25.561]  Other chronic pain [G89.29]  Pain in left hip [M25.552]    SUBJECTIVE  Pain Level (0-10 scale): 8/10  Any medication changes, allergies to medications, adverse drug reactions, diagnosis change, or new procedure performed?: [x]  No    []  Yes (see summary sheet for update)  Subjective functional status/changes:   []  No changes reported  Pt continues to report increased soreness post therapy sessions.     OBJECTIVE  Modality rationale: decrease pain and increase tissue extensibility to improve the patient???s ability to tolerate ambulation, transfers, and ADL's   Min Type Additional Details   10 []  Estim: [] Att   [x] Unatt  [] TENS instruct                 [x] IFC  [] Premod [] NMES                       [] Other:  [] w/US   [] w/ice   [x] w/heat  Position: semi reclined  Location: lumbar spine    []   Traction: []  Cervical       [] Lumbar                       []  Prone          [] Supine                       [] Intermittent   [] Continuous Lbs:  []  before manual  []  after manual    []   Ultrasound: [] Continuous   []  Pulsed                           [] 1MHz   [] 3MHz Location:  W/cm2:    []   Iontophoresis with dexamethasone         Location: []  Take home patch   []  In clinic    []   Ice     []   heat  []   Ice massage Position:  Location:    []   Vasopneumatic Device Pressure: []  lo []  med []  hi   Temp: []  lo []  med []  hi   [x]  Skin assessment post-treatment:  [x] intact [] redness- no adverse reaction       [] redness ??? adverse reaction:       23 min  Therapeutic Exercise:  []  See flow sheet :   Rationale: increase ROM and increase strength to improve the patient???s ability to walk, transfer, stand    10 min Therapeutic Activity:  []   See flow sheet : sit to stands, TKE    Rationale:      10 min Neuromuscular Re-education:  []   See flow sheet : ROM, MSR, cone taps   Rationale: improve coordination, improve balance and increase proprioception  to improve the patient???s ability to safely ambulate without AD and reduce fall risk     6 min Gait Training:  Gait in clinic  without SPC, side stepping without AD   Rationale:           min Patient Education: [x]  Review HEP    []  Progressed/Changed HEP based on:   encouraged daily compliance of current HEP     Other Objective/Functional Measures:    Transitioned side stepping outside of the parallel bars   Added cones taps to 4" step    Added standing TKE    Sit to stands: from elevated mat table, arms across chest, VC's for core engagement     Pain Level (0-10 scale) post treatment: 8/10     ASSESSMENT/Changes in Function: Noted improved fluidity of gait with less antalgia present at beginning of session, but gradually increases with fatigue. Able to progress standing exercises today with fair tolerance. Continues to over compensate with Jim Shaw, requiring VC's to obtain equal weight shift. Reduced strength of R quad noted with partial range of LAQ and inability to complete full TKE.     Patient will continue to benefit from skilled PT services to modify and progress therapeutic interventions, address functional mobility deficits, address ROM deficits, address strength deficits, analyze and address soft tissue restrictions, analyze and cue movement patterns, analyze and modify body mechanics/ergonomics, assess and modify postural abnormalities, address imbalance/dizziness and instruct in home and community integration to attain remaining goals.     [x]   See Plan of Care  []   See progress note/recertification  []   See Discharge  Summary         Progress towards goals / Updated goals:  Short Term Goals:??To be accomplished in 1??weeks:  1) Goal: Patient will be independent and compliant with HEP in order to progress toward long term goals.  Status at last note/certification: Progressing; fair compliance- will con't to monitor (08/06/18)   Long Term Goals:??To be accomplished in 10??treatments:  1) Goal: Patient will improve FOTO assessment score to 46 pts in order to indicate improved functional abilities.  Status at last note/certification: 36 pts  2) Goal: Patient will improve BIL hip extension to 10 deg for improved posture and gait mechanics  Status at last note/certification: 0 deg  3) Goal: Patient will demonstrate ability to squat to 90 deg hip flexion or better for proper lifting  Status at last note/certification: 742 deg  4) Goal: Patient will report overall at least 65% improvement in function in order to progress toward premorbid status.  Status at last note/certification: Progressing: pt unsure about % improvement at this time (08/15/18)  5) Goal: Patient will report ability to walk/stand for 30 mins to buy groceries  Status at last note/certification: Progressing: 15 minutes (08/08/18)     PLAN  [x]   Upgrade activities as tolerated     []   Continue plan of care  []   Update interventions per flow sheet       []   Discharge due to:_  []   Other:_      Almyra Deforest, PT 08/15/2018  7:56 AM

## 2018-08-15 NOTE — Progress Notes (Signed)
PT DAILY TREATMENT NOTE 8-14    Patient Name: Jim HickmanChristopher Dewayne Shaw  Date:08/15/2018  DOB: 12/26/1970  [x]   Patient DOB Verified  Payor: [employer] / [place] [employer] [place] / Product Type: Managed Care Medicaid /    In time: 9:01 am  Out time: 10:00  Total Treatment Time (min): 59  Total Timed Codes (min): 49  1:1 Treatment Time (min): 49  Visit #: 4 of 8    Treatment Area: Radiculopathy, lumbar region [M54.16]  Spinal stenosis, cervical region [M48.02]  Pain in right knee [M25.561]  Other chronic pain [G89.29]  Pain in left hip [M25.552]    SUBJECTIVE  Pain Level (0-10 scale): 8/10  Any medication changes, allergies to medications, adverse drug reactions, diagnosis change, or new procedure performed?: [x]  No    []  Yes (see summary sheet for update)  Subjective functional status/changes:   []  No changes reported  Pt continues to report increased soreness post therapy sessions.     OBJECTIVE  Modality rationale: decrease pain and increase tissue extensibility to improve the patient???s ability to tolerate ambulation, transfers, and ADL's   Min Type Additional Details   10 []  Estim: [] Att   [x] Unatt  [] TENS instruct                 [x] IFC  [] Premod [] NMES                       [] Other:  [] w/US   [] w/ice   [x] w/heat  Position: semi reclined  Location: lumbar spine    []   Traction: []  Cervical       [] Lumbar                       []  Prone          [] Supine                       [] Intermittent   [] Continuous Lbs:  []  before manual  []  after manual    []   Ultrasound: [] Continuous   []  Pulsed                           [] 1MHz   [] 3MHz Location:  W/cm2:    []   Iontophoresis with dexamethasone         Location: []  Take home patch   []  In clinic    []   Ice     []   heat  []   Ice massage Position:  Location:    []   Vasopneumatic Device Pressure: []  lo []  med []  hi   Temp: []  lo []  med []  hi   [x]  Skin assessment post-treatment:  [x] intact [] redness- no adverse reaction       [] redness ??? adverse reaction:        23 min Therapeutic Exercise:  []  See flow sheet :   Rationale: increase ROM and increase strength to improve the patient???s ability to walk, transfer, stand    10 min Therapeutic Activity:  []   See flow sheet : sit to stands, TKE    Rationale:      10 min Neuromuscular Re-education:  []   See flow sheet : ROM, MSR, cone taps   Rationale: improve coordination, improve balance and increase proprioception  to improve the patient???s ability to safely ambulate without AD and reduce fall risk     6 min Gait Training:  Gait in clinic  without SPC, side stepping without AD   Rationale:           min Patient Education: [x]  Review HEP    []  Progressed/Changed HEP based on:   encouraged daily compliance of current HEP     Other Objective/Functional Measures:    Transitioned side stepping outside of the parallel bars   Added cones taps to 4" step    Added standing TKE    Sit to stands: from elevated mat table, arms across chest, VC's for core engagement     Pain Level (0-10 scale) post treatment: 8/10     ASSESSMENT/Changes in Function: Noted improved fluidity of gait with less antalgia present at beginning of session, but gradually increases with fatigue. Able to progress standing exercises today with fair tolerance. Continues to over compensate with L LE, requiring VC's to obtain equal weight shift. Reduced strength of R quad noted with partial range of LAQ and inability to complete full TKE.     Patient will continue to benefit from skilled PT services to modify and progress therapeutic interventions, address functional mobility deficits, address ROM deficits, address strength deficits, analyze and address soft tissue restrictions, analyze and cue movement patterns, analyze and modify body mechanics/ergonomics, assess and modify postural abnormalities, address imbalance/dizziness and instruct in home and community integration to attain remaining goals.     [x]   See Plan of Care  []   See progress note/recertification   []   See Discharge Summary         Progress towards goals / Updated goals:  Short Term Goals:??To be accomplished in 1??weeks:  1) Goal: Patient will be independent and compliant with HEP in order to progress toward long term goals.  Status at last note/certification: Progressing; fair compliance- will con't to monitor (08/06/18)   Long Term Goals:??To be accomplished in 10??treatments:  1) Goal: Patient will improve FOTO assessment score to 46 pts in order to indicate improved functional abilities.  Status at last note/certification: 36 pts  2) Goal: Patient will improve BIL hip extension to 10 deg for improved posture and gait mechanics  Status at last note/certification: 0 deg  3) Goal: Patient will demonstrate ability to squat to 90 deg hip flexion or better for proper lifting  Status at last note/certification: 784 deg  4) Goal: Patient will report overall at least 65% improvement in function in order to progress toward premorbid status.  Status at last note/certification: Progressing: pt unsure about % improvement at this time (08/15/18)  5) Goal: Patient will report ability to walk/stand for 30 mins to buy groceries  Status at last note/certification: Progressing: 15 minutes (08/08/18)     PLAN  [x]   Upgrade activities as tolerated     []   Continue plan of care  []   Update interventions per flow sheet       []   Discharge due to:_  []   Other:_      Almyra Deforest, PT 08/15/2018  7:56 AM

## 2018-08-16 ENCOUNTER — Ambulatory Visit
Admit: 2018-08-16 | Discharge: 2018-08-16 | Payer: PRIVATE HEALTH INSURANCE | Attending: Specialist | Primary: Family Medicine

## 2018-08-16 ENCOUNTER — Ambulatory Visit: Attending: Specialist | Primary: Family Medicine

## 2018-08-16 DIAGNOSIS — M1612 Unilateral primary osteoarthritis, left hip: Secondary | ICD-10-CM
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
## 2018-08-17 ENCOUNTER — Inpatient Hospital Stay: Admit: 2018-08-17 | Payer: MEDICAID | Primary: Family Medicine

## 2018-08-17 NOTE — Progress Notes (Signed)
 PT DAILY TREATMENT NOTE 8-14    Patient Name: Jim Shaw  Date:08/17/2018  DOB: 1970-11-22  [x]   Patient DOB Verified  Payor: [employer] / Plan: [place] [employer] / Product Type: Managed Care Medicaid /    In time: 8:00 am       Out time: 9:05 am  Total Treatment Time (min): 65  Total Timed Codes (min): 55  1:1 Treatment Time (min): 55  Visit #: 5 of 8    Treatment Area: Radiculopathy, lumbar region [M54.16]  Spinal stenosis, cervical region [M48.02]  Pain in right knee [M25.561]  Other chronic pain [G89.29]  Pain in left hip [M25.552]    SUBJECTIVE  Pain Level (0-10 scale): 8  Any medication changes, allergies to medications, adverse drug reactions, diagnosis change, or new procedure performed?: [x]  No    []  Yes (see summary sheet for update)  Subjective functional status/changes:   []  No changes reported  Patient notes cont'd pain at all times, although he is able to walk for short distances within his cane. He states he uses his brace at all times when walking to avoid his right knee from buckling this causing a fall.    OBJECTIVE  Modality rationale: decrease pain and increase tissue extensibility to improve the patient's ability to tolerate ambulation, transfers, and ADL's   Min Type Additional Details   10 []  Estim: [] Att   [x] Unatt  [] TENS instruct                 [x] IFC  [] Premod [] NMES                       [] Other:  [] w/US    [] w/ice   [x] w/heat  Position: semi-reclined  Location: lumbar spine    []   Traction: []  Cervical       [] Lumbar                       []  Prone          [] Supine                       [] Intermittent   [] Continuous Lbs:  []  before manual  []  after manual    []   Ultrasound: [] Continuous   []  Pulsed                           []   [] Location:  W/cm2:    []   Iontophoresis with dexamethasone         Location: []  Take home patch   []  In clinic    []   Ice     []   heat  []   Ice massage Position:  Location:    []   Vasopneumatic Device Pressure: []  lo []  med []  hi    Temp: []  lo []  med []  hi   [x]  Skin assessment post-treatment:  [x] intact [] redness- no adverse reaction       [] redness - adverse reaction:     24 min Therapeutic Exercise:  [x]  See flow sheet: progressed TA draw to include SB overhead to challenge upper abdominals associated with OH reaching   Rationale: increase ROM and increase strength to improve the patient's ability to walk, transfer, stand    11 min Therapeutic Activity:  [x]   See flow sheet:    Rationale:  improve coordination, improve balance and increase proprioception  to improve the patient's ability  to perform transfers       14 min Neuromuscular Re-education:  [x]   See flow sheet: added SB L/S flexion 3-way to decrease L/S stiffness and increase core engagement (RF and obliques)   Rationale: improve coordination, improve balance and increase proprioception  to improve the patient's ability to safely ambulate without AD and reduce fall risk     6 min Gait Training:  gait in clinic without SPC (forward/retro), side stepping without AD   Rationale:  increase proprioception, improve balance strategies, increase strength to improve the patient's ability to ambulate with reduced fall risk          X min Patient Education: [x]  Review HEP     Other Objective/Functional Measures:   Modified treatment progression based on availability of area due to COVID-19 precautions: gait training, neuromuscular reeducation, nustep warmup, etc.  Poor LAQ due to quad weakness, approx 40% AROM from full extension in sitting  Poor hip flexor strength, able to demo normal movement patterns to approx 90 deg.  Patient's quad spasmed in an exaggerated manner with 3# LAQs    Gait training: pt demos 1 LOB during retro walking with intact stepping strategy and min (A) required to prevent a fall     Pain Level (0-10 scale) post treatment: 7-8    ASSESSMENT/Changes in Function:   Patient with slow progress with PT, demo'ing steady improvement in overall gait tolerance and quality.  Lacking appropriate quad and hamstring strength for (I) amb without brace at this time.    Patient will continue to benefit from skilled PT services to modify and progress therapeutic interventions, address functional mobility deficits, address ROM deficits, address strength deficits, analyze and address soft tissue restrictions, analyze and cue movement patterns, analyze and modify body mechanics/ergonomics, assess and modify postural abnormalities, address imbalance/dizziness and instruct in home and community integration to attain remaining goals.     [x]   See Plan of Care  []   See progress note/recertification  []   See Discharge Summary         Progress towards goals / Updated goals:  Short Term Goals:To be accomplished in 1weeks:  1) Goal: Patient will be independent and compliant with HEP in order to progress toward long term goals.  Status at last note/certification: Progressing; fair compliance- will con't to monitor (08/06/18)   Long Term Goals:To be accomplished in 10treatments:  1) Goal: Patient will improve FOTO assessment score to 46 pts in order to indicate improved functional abilities.  Status at last note/certification: 36 pts  2) Goal: Patient will improve BIL hip extension to 10 deg for improved posture and gait mechanics  Status at last note/certification: 0 deg  3) Goal: Patient will demonstrate ability to squat to 90 deg hip flexion or better for proper lifting  Status at last note/certification: 130 deg   Current: patient able to perform 60 deg squat to elevated mat (08/17/18)  4) Goal: Patient will report overall at least 65% improvement in function in order to progress toward premorbid status.  Status at last note/certification: Progressing: pt unsure about % improvement at this time (08/15/18)  5) Goal: Patient will report ability to walk/stand for 30 mins to buy groceries  Status at last note/certification: Progressing: 15 minutes (08/08/18)     PLAN  [x]   Upgrade activities as tolerated      [x]   Continue plan of care  []   Update interventions per flow sheet       []   Discharge due to:_  []   Other:_      Jonathan M Quiazon, PTA 08/17/2018

## 2018-08-17 NOTE — Progress Notes (Signed)
PT DAILY TREATMENT NOTE 8-14    Patient Name: Jim Shaw  Date:08/17/2018  DOB: November 05, 1970  [x]   Patient DOB Verified  Payor: [employer] / [place] [employer] / Product Type: Managed Care Medicaid /    In time: 8:00 am       Out time: 9:05 am  Total Treatment Time (min): 65  Total Timed Codes (min): 55  1:1 Treatment Time (min): 55  Visit #: 5 of 8    Treatment Area: Radiculopathy, lumbar region [M54.16]  Spinal stenosis, cervical region [M48.02]  Pain in right knee [M25.561]  Other chronic pain [G89.29]  Pain in left hip [M25.552]    SUBJECTIVE  Pain Level (0-10 scale): 8  Any medication changes, allergies to medications, adverse drug reactions, diagnosis change, or new procedure performed?: [x]  No    []  Yes (see summary sheet for update)  Subjective functional status/changes:   []  No changes reported  Patient notes cont'd pain at all times, although he is able to walk for short distances within his cane. He states he uses his brace at all times when walking to avoid his right knee from buckling this causing a fall.    OBJECTIVE  Modality rationale: decrease pain and increase tissue extensibility to improve the patient???s ability to tolerate ambulation, transfers, and ADL's   Min Type Additional Details   10 []  Estim: [] Att   [x] Unatt  [] TENS instruct                 [x] IFC  [] Premod [] NMES                       [] Other:  [] w/US   [] w/ice   [x] w/heat  Position: semi-reclined  Location: lumbar spine    []   Traction: []  Cervical       [] Lumbar                       []  Prone          [] Supine                       [] Intermittent   [] Continuous Lbs:  []  before manual  []  after manual    []   Ultrasound: [] Continuous   []  Pulsed                           [] 1MHz   [] 3MHz Location:  W/cm2:    []   Iontophoresis with dexamethasone         Location: []  Take home patch   []  In clinic    []   Ice     []   heat  []   Ice massage Position:  Location:    []   Vasopneumatic Device Pressure: []  lo []  med []  hi    Temp: []  lo []  med []  hi   [x]  Skin assessment post-treatment:  [x] intact [] redness- no adverse reaction       [] redness ??? adverse reaction:     24 min Therapeutic Exercise:  [x]  See flow sheet: progressed TA draw to include SB overhead to challenge upper abdominals associated with OH reaching   Rationale: increase ROM and increase strength to improve the patient???s ability to walk, transfer, stand    11 min Therapeutic Activity:  [x]   See flow sheet:    Rationale:  improve coordination, improve balance and increase proprioception  to improve the patient???s ability  to perform transfers       14 min Neuromuscular Re-education:  [x]   See flow sheet: added SB L/S flexion 3-way to decrease L/S stiffness and increase core engagement (RF and obliques)   Rationale: improve coordination, improve balance and increase proprioception  to improve the patient???s ability to safely ambulate without AD and reduce fall risk     6 min Gait Training:  gait in clinic without SPC (forward/retro), side stepping without AD   Rationale:  increase proprioception, improve balance strategies, increase strength to improve the patient's ability to ambulate with reduced fall risk          X min Patient Education: [x]  Review HEP     Other Objective/Functional Measures:   Modified treatment progression based on availability of area due to COVID-19 precautions: gait training, neuromuscular reeducation, nustep warmup, etc.  Poor LAQ due to quad weakness, approx 40% AROM from full extension in sitting  Poor hip flexor strength, able to demo normal movement patterns to approx 90 deg.  Patient's quad spasmed in an exaggerated manner with 3# LAQs    Gait training: pt demos 1 LOB during retro walking with intact stepping strategy and min (A) required to prevent a fall     Pain Level (0-10 scale) post treatment: 7-8    ASSESSMENT/Changes in Function:   Patient with slow progress with PT, demo'ing steady improvement in overall  gait tolerance and quality. Lacking appropriate quad and hamstring strength for (I) amb without brace at this time.    Patient will continue to benefit from skilled PT services to modify and progress therapeutic interventions, address functional mobility deficits, address ROM deficits, address strength deficits, analyze and address soft tissue restrictions, analyze and cue movement patterns, analyze and modify body mechanics/ergonomics, assess and modify postural abnormalities, address imbalance/dizziness and instruct in home and community integration to attain remaining goals.     [x]   See Plan of Care  []   See progress note/recertification  []   See Discharge Summary         Progress towards goals / Updated goals:  Short Term Goals:??To be accomplished in 1??weeks:  1) Goal: Patient will be independent and compliant with HEP in order to progress toward long term goals.  Status at last note/certification: Progressing; fair compliance- will con't to monitor (08/06/18)   Long Term Goals:??To be accomplished in 10??treatments:  1) Goal: Patient will improve FOTO assessment score to 46 pts in order to indicate improved functional abilities.  Status at last note/certification: 36 pts  2) Goal: Patient will improve BIL hip extension to 10 deg for improved posture and gait mechanics  Status at last note/certification: 0 deg  3) Goal: Patient will demonstrate ability to squat to 90 deg hip flexion or better for proper lifting  Status at last note/certification: 130 deg   Current: patient able to perform 60 deg squat to elevated mat (08/17/18)  4) Goal: Patient will report overall at least 65% improvement in function in order to progress toward premorbid status.  Status at last note/certification: Progressing: pt unsure about % improvement at this time (08/15/18)  5) Goal: Patient will report ability to walk/stand for 30 mins to buy groceries  Status at last note/certification: Progressing: 15 minutes (08/08/18)     PLAN   [x]   Upgrade activities as tolerated     [x]   Continue plan of care  []   Update interventions per flow sheet       []   Discharge due to:_  []   Other:_      Carin PrimroseJonathan M Orlie Cundari, PTA 08/17/2018

## 2018-08-21 ENCOUNTER — Inpatient Hospital Stay: Payer: MEDICAID | Primary: Family Medicine

## 2018-08-22 ENCOUNTER — Ambulatory Visit
Admit: 2018-08-22 | Discharge: 2018-08-22 | Payer: PRIVATE HEALTH INSURANCE | Attending: Family Medicine | Primary: Family Medicine

## 2018-08-22 ENCOUNTER — Ambulatory Visit: Attending: Family Medicine | Primary: Family Medicine

## 2018-08-22 DIAGNOSIS — M5416 Radiculopathy, lumbar region: Secondary | ICD-10-CM

## 2018-08-22 MED ORDER — CYCLOBENZAPRINE 10 MG TAB
10 mg | ORAL_TABLET | ORAL | 2 refills | Status: DC
Start: 2018-08-22 — End: 2018-12-11

## 2018-08-22 MED ORDER — CELECOXIB 200 MG CAP
200 mg | ORAL_CAPSULE | ORAL | 5 refills | Status: DC
Start: 2018-08-22 — End: 2018-12-11

## 2018-08-22 MED ORDER — PREGABALIN 150 MG CAP
150 mg | ORAL_CAPSULE | Freq: Three times a day (TID) | ORAL | 5 refills | Status: DC
Start: 2018-08-22 — End: 2018-12-11

## 2018-08-22 NOTE — Progress Notes (Signed)
Progress Notes by Kern Reap, Shaw at 08/22/18 0915                Author: Kern Reap, Shaw  Service: --  Author Type: Physician       Filed: 08/22/18 2131  Encounter Date: 08/22/2018  Status: Signed          Editor: Kern Reap, Shaw (Physician)                         [facility]  [address], [place] [address]   Phone: [phone]   Fax: [phone]      Pt's date of birth: July 06, 1970      ASSESSMENT   Diagnoses and all orders for this visit:      1. Lumbar radiculopathy   -     pregabalin (Lyrica) 150 mg capsule; Take 1 Cap by mouth three (3) times daily. Max Daily Amount: 450 mg.      2. Lumbar facet arthropathy   -     celecoxib (CeleBREX) 200 mg capsule; Take 1 cap by mouth daily as needed for pain with meals.      3. Muscle spasm   -     cyclobenzaprine (FLEXERIL) 10 mg tablet; Take 1 tab by mouth BID-TID as needed for muscle spasm      4. Chronic pain of right knee      5. Sleep apnea, unspecified type      6. Tobacco use      7. BMI 39.0-39.9,adult             IMPRESSION AND PLAN:   Jim Shaw is a 48 y.o. male with history of cervical and lumbar. He complains of pain in the  lower back that radiates down the right leg to the foot, unchanged since his last office visit. He increased his Lyrica 75 mg to 2 caps TID but he denies any change in his symptoms. Pt also takes Celebrex  200 mg 2 caps QHS. He notes minimal relief with Parafon Forte 500 mg.      1) Pt was given information on cervical and lumbar arthritis exercises.    2) He will increase his Lyrica 150 mg to 1 cap TID.   3) Pt will decrease his Celebrex 200 mg to 1 cap daily .   4) He was prescribed Flexeril 10 mg 1 tab BID-TID prn muscle spasm.   5) I recommended that the patient lose weight and he was given information on the Mediterranean diet.    5) Jim Shaw has a reminder for  a "due or due soon" health maintenance. I have asked that he  contact his  primary care provider, Jim Shaw, for follow-up on this health maintenance.   6) PMP demonstrated consistency with prescribing.   7) Smoking cessation also recommended     Follow-up and Dispositions      ??  Return in about 3 months (around 11/22/2018) for Medication follow up.                      HISTORY OF PRESENT ILLNESS:   Jim Shaw is a 49 y.o.  male with history of cervical and lumbar pain and presents to the office today for follow up.  He complains of pain in the lower back that radiates down the right leg to the foot,  unchanged since his last office visit. Pt reports increased aching pain, particularly with cold and rainy weather.  He notes that on Monday, he urinated on himself when he was unable to make it to the bathroom in time. Pt reports that he was followed by urology in 02/2018 and according to the patient, he has narrowing of the urethra. Pt notes that he has not discussed  his recent episode of incontinence with his PCP, Jim Shaw, but reports experiencing similar episodes over the years. He followed  Jim Shaw regarding his hip and knee pain and received injections. Pt admits to improvement in his hip pain with the injection but notes continued pain in the right knee. He states that his knee occasionally gives out. Pt admits to difficulty with  ambulation and presents to the office today wearing a hinged knee support. He notes that he does not have to ambulate with the assistance of his cane when wearing the knee support. Pt is currently enrolled in knee physical therapy and notes that he will  be fitted for a new knee support. He increased his Lyrica 75 mg to 2 caps TID but he denies any change in his symptoms. Pt also takes Celebrex 200 mg 2 caps QHS. He notes minimal relief with Parafon Forte 500 mg. Pt reports that his chart indicated he  is allergic to Percocet, but patient denies any allergy to Percocet. He admits to  difficulty with sleep secondary to sleep apnea and anxiety. Pt at this time desires to proceed with medication evaluation.      Pt notes that he will be moving to [place] within the next 3 months.       Pain Scale: 8/10      PCP: Jim Shaw         Past Medical History:        Diagnosis  Date         ?  Anxiety       ?  Costochondritis       ?  Depression       ?  Major depressive disorder  11/08/2017     ?  Psychiatric disorder           ?  PTSD (post-traumatic stress disorder)               Social History          Socioeconomic History         ?  Marital status:  SINGLE              Spouse name:  Not on file         ?  Number of children:  Not on file     ?  Years of education:  Not on file     ?  Highest education level:  Not on file       Occupational History        ?  Not on file       Social Needs         ?  Financial resource strain:  Not on file        ?  Food insecurity              Worry:  Not on file              Inability:  Not on file        ?  Transportation needs  Medical:  Not on file         Non-medical:  Not on file       Tobacco Use         ?  Smoking status:  Current Every Day Smoker              Packs/day:  0.25         Years:  20.00         Pack years:  5.00         ?  Smokeless tobacco:  Never Used       Substance and Sexual Activity         ?  Alcohol use:  Yes             Comment: 5-6 shots/day         ?  Drug use:  Yes              Types:  Marijuana         ?  Sexual activity:  Not on file       Lifestyle        ?  Physical activity              Days per week:  Not on file         Minutes per session:  Not on file         ?  Stress:  Not on file       Relationships        ?  Social Engineer, manufacturing systems on phone:  Not on file         Gets together:  Not on file         Attends religious service:  Not on file         Active member of club or organization:  Not on file         Attends meetings of clubs or organizations:  Not on file         Relationship  status:  Not on file        ?  Intimate partner violence              Fear of current or ex partner:  Not on file         Emotionally abused:  Not on file         Physically abused:  Not on file         Forced sexual activity:  Not on file        Other Topics  Concern         ?  Military Service  Not Asked     ?  Blood Transfusions  Not Asked     ?  Caffeine Concern  Not Asked     ?  Occupational Exposure  Not Asked     ?  Hobby Hazards  Not Asked     ?  Sleep Concern  Not Asked     ?  Stress Concern  Not Asked     ?  Weight Concern  Not Asked     ?  Special Diet  Not Asked     ?  Back Care  Not Asked     ?  Exercise  Not Asked     ?  Bike Helmet  Not Asked     ?  Seat Belt  Not Asked     ?  Self-Exams  Not Asked       Social History Narrative        ?  Not on file             Current Outpatient Medications          Medication  Sig  Dispense  Refill           ?  omeprazole (PRILOSEC) 20 mg capsule  Take 20 mg by mouth daily.         ?  pregabalin (Lyrica) 150 mg capsule  Take 1 Cap by mouth three (3) times daily. Max Daily Amount: 450 mg.  90 Cap  5     ?  celecoxib (CeleBREX) 200 mg capsule  Take 1 cap by mouth daily as needed for pain with meals.  30 Cap  5     ?  cyclobenzaprine (FLEXERIL) 10 mg tablet  Take 1 tab by mouth BID-TID as needed for muscle spasm  60 Tab  2     ?  eszopiclone (LUNESTA) 3 mg tablet  Take  by mouth nightly.         ?  DULoxetine (CYMBALTA) 60 mg capsule  TK ONE C PO D         ?  chlorzoxazone (Parafon Forte DSC) 500 mg tablet  Take 1 tab by mouth BID-TID as needed for muscle spasm.  60 Tab  1     ?  pregabalin (Lyrica) 75 mg capsule  Take 1-2 caps by mouth two (2) times daily as directed. Max Daily Amount: 300 mg.  Indications: disorder characterized by stiff, tender & painful muscles  120 Cap  1     ?  diazePAM (VALIUM) 10 mg tablet  TK 1 T PO  QHS         ?  lamoTRIgine (LaMICtal) 25 mg tablet  Take 100 mg by mouth nightly.         ?  triamcinolone acetonide (KENALOG) 0.1 % ointment            ?  fluticasone propionate (FLONASE) 50 mcg/actuation nasal spray  2 Sprays by Both Nostrils route daily.         ?  ciclopirox (PENLAC) 8 % solution  Apply  to affected area nightly.         ?  hydrOXYzine HCl (ATARAX) 50 mg tablet  Take 50 mg by mouth as needed for Anxiety.         ?  prazosin (MINIPRESS) 1 mg capsule  TK 1 C PO QHS         ?  ramelteon (ROZEREM) 8 mg tablet  TK 1 T PO HS         ?  risperiDONE (RisperDAL) 1 mg tablet  TK 2 TS PO HS         ?  tiZANidine (ZANAFLEX) 4 mg tablet  Take 1 tab by mouth BID-TID as needed for muscle spasm.  60 Tab  1     ?  mirtazapine (REMERON) 15 mg tablet  TK 1 T PO HS         ?  celecoxib (CELEBREX) 100 mg capsule  Take 1 Cap by mouth daily.         ?  tiZANidine (ZANAFLEX) 4 mg tablet  Take 1 Tab by mouth three (3) times daily.  90 Tab  1           ?  pregabalin (LYRICA) 100 mg capsule  Take 1 Cap by mouth three (3) times daily. Max Daily Amount: 300 mg.  90 Cap  1           ?  gabapentin (NEURONTIN) 600 mg tablet  Take 600 mg by mouth three (3) times daily.         ?  venlafaxine-SR (EFFEXOR-XR) 150 mg capsule  Take 1 Cap by mouth daily (with breakfast). Indications: Anxiousness associated with Depression  15 Cap  1           ?  QUEtiapine (SEROQUEL) 50 mg tablet  Take 1 Tab by mouth nightly. Indications: Additional Medications to Treat Depression (Patient not taking: Reported on 05/23/2018)  1 Tab  0             Allergies        Allergen  Reactions         ?  Oxycodone-Acetaminophen  Anaphylaxis and Swelling     ?  Duloxetine Hcl  Rash     ?  Aspirin  Other (comments)     ?  Cortisone  Nausea and Vomiting             Also rash          ?  Ibuprofen  Rash              REVIEW OF SYSTEMS      Constitutional: Negative for fever, chills, or weight change.    Respiratory: Negative for cough or shortness of breath.      Cardiovascular: Negative for chest pain or palpitations.   Gastrointestinal: Negative for acid reflux, change in bowel habits, or constipation.    Genitourinary: Negative for dysuria and flank pain.    Musculoskeletal: Positive for lumbar and knee pain.    Neurological: Negative for headaches, dizziness, or numbness.   Endo/Heme/Allergies: Negative for increased bruising.    Psychiatric/Behavioral: Positive for difficulty with sleep.     As per HPI      PHYSICAL EXAMINATION   Visit Vitals      BP  114/78 (BP 1 Location: Left arm, BP Patient Position: Sitting)     Pulse  75     Temp  98.1 ??F (36.7 ??C) (Oral)     Ht  6' (1.829 m)     Wt  290 lb 9.6 oz (131.8 kg)     SpO2  97%        BMI  39.41 kg/m??           Constitutional: Awake, alert, and in no acute distress.  Neurological: 1+ symmetrical DTRs in the upper extremities. 1+ symmetrical DTRs in the lower extremities. Sensation to light touch is intact. Negative Hoffman's sign bilaterally.   Skin: warm, dry, and intact.    Musculoskeletal: Tight across the upper trapezius bilaterally. Tenderness to palpation in the lower lumbar region. Decreased range of motion with side to side cervical flexion. Moderate pain with extension and axial loading. No pain with internal or external  rotation of his hips. Negative straight leg raise bilaterally.      Patient ambulates with the assistance of a single point cane. He presents to the office wearing a hinged right knee support.                  Biceps   Triceps  Deltoids  Wrist Ext  Wrist Flex  Hand Intrin             Right  +  4/5  +4/5  +4/5  +4/5  +4/5  +4/5             Left  +4/5  +4/5  +4/5  +4/5  +4/5  +4/5                  Hip Flex   Quads  Hamstrings  Ankle DF  EHL  Ankle PF             Right  4/5  4/5  4/5  4/5  4/5  4/5             Left  +4 to -5/5  +4 to -5/5  +4 to -5/5  +4 to -5/5  +4 to -5/5  +4 to -5/5        IMAGING:      Hip and??knee x-rays from 04/10/2018 were??personally reviewed with the patient and demonstrated:   Minimal??degenerative??changes.??   ??   Cervical MRI from 10/13/2017 was personally reviewed with the patient and demonstrated:     ??        Interface, [facility] - 10/13/2017 ??4:00 PM EDT  EXAM: MRI of cervical spine without contrast    CLINICAL INDICATION/HISTORY:  M54.41: Low back pain with right-sided sciatica    COMPARISON: None    TECHNIQUE: Multiplanar multi-sequential imaging of the cervical spine without contrast. T1W, T2W and STIR sequences were obtained in the sagittal plane. T2W and GRE sequences  were obtained in the axial plane.    FINDINGS:     Postoperative changes: None.    Alignment: Overall straightening of normal cervical lordosis. No focal listhesis.    Vertebral body heights: Preserved. No edema.  -Mild  intervertebral disc space narrowing at C5-6 and C4-5. Slight disc bulge at several levels.    Marrow signal: No edema.    Cord: Normal caliber. No syrinx or edema.    Cerebellar tonsils: No Chiari 1 malformation.      Correlation  of axial and sagittal data through the disc levels:    -C2-3: No significant canal or foraminal stenosis    -C3-4: Mild broad-based posterior disc bulge effaces fluid space and contributes to mild central canal narrowing to 10 mm. No significant  foraminal narrowing.    -C4-5: Minimal broad-based posterior disc bulge. No significant canal narrowing.   -Mild to moderate right foraminal stenosis due to asymmetric disc bulge and some uncovertebral spur.    -C5-6: Broad-based posterior  disc bulge effaces fluid space and may contact the left ventral cervical cord. No edema. Mild central canal narrowing to 9.5 mm.   -There is mild left lateral recess narrowing and mild to borderline moderate left foraminal narrowing due to disc bulge  and uncovertebral spur.    -C6-7: No significant canal or foraminal stenosis.    -C7-T1: No significant canal or foraminal stenosis.      IMPRESSION  1. Straightened cervical lordosis. No compression deformity, edema or listhesis.     2. Multilevel degenerative discogenic disease with mild degree of canal narrowing at several levels.  -Ventral cord contact and minimal  distortion due to disc bulge C5-C6 as above.    3. Varying degrees of bilateral foraminal narrowing most  significant right C4-5 and left C5-6.    Signed By: Emilia Beckichard J Thomas, Shaw on 10/13/2017 3:58 PM     ??   ??   ??   Lumbar MRI from 10/13/2017 was personally reviewed with the patient and demonstrated:     ??  Interface, [facility] Rad Res - 10/13/2017 ??4:37 PM EDT  EXAM: MR LUMBAR SPINE WITHOUT CONTRAST    CLINICAL INDICATION/HISTORY:  ??M54.41: Low back pain with right-sided sciatica    COMPARISON: None    TECHNIQUE: Multiplanar multi-sequential imaging of the lumbar spine without contrast. T1W, T2W and STIR sequences were obtained in the sagittal plane. Stacked axial  T2 and axial T1W through the lower 3 disc levels.    FINDINGS:     Postoperative : None.    Vertebral alignment: Mild straightening of lumbar lordosis with trace retrolisthesis of L5. No spondylolysis.    Vertebral body heights:  Normal. Mild endplate concavity in L5 and L4.    Marrow signal: Normal.    Cord: Conus medullaris terminates at the L1-L2 level with normal signal.    Soft tissues: Normal.    Correlation of axial and sagittal data through  the disc levels:    -L1-2: No significant disc pathology. No spinal canal or foraminal stenosis.    -L2-3: No significant disc pathology. Mild facet and ligamentous hypertrophy with bilateral facet joint effusion. No central or foraminal  stenosis    -L3-4: No significant disc pathology. Similarly, mild bilateral facet and ligamentous hypertrophy with small facet joint effusion. No central or foraminal stenosis.    -L4-5: Mild posterior disc bulge. No central stenosis. More  severe facet and ligamentous hypertrophy with mild facet inflammation. No significant foraminal stenosis    -L5-S1: Disc bulge most prominent. Still no severe central stenosis. Facet and ligamentous hypertrophy with inflammation. Moderate bilateral  foraminal stenosis. Mild compression of bilateral exiting L5 nerve root  suspected.    IMPRESSION  Posterior disc bulge and facet/ligamentum flavum hypertrophy most prominent at L5-S1. Moderate bilateral foraminal stenosis with suspected mild  compression bilateral exiting L5 nerve roots. Facet hypertrophy and inflammation throughout.    Signed By: Gretel Acre, Shaw on 10/13/2017 4:35 PM     ??   ??   Right upper extremity EMG from 12/27/2008 was personally reviewed with the patient and demonstrated:   NCV & EMG Findings:   All nerve conduction studies (as indicated in the following tables) were within normal limits. ??   ??   All examined muscles (as indicated in the following table) showed no evidence of electrical instability.????   ??   INTERPRETATION   This was a normal nerve conduction and EMG study showing there to be no signs of neuropathy, myopathy, or radiculopathy in the nerves and muscles tested.??   ??   CLINICAL INTERPRETATION   There are no electrodiagnostic findings correlating with his symptoms.??         Written by Robinette Haines, [place], as dictated by Delrae Sawyers, Shaw.   I, Dr. Delrae Sawyers confirm that all documentation is accurate.

## 2018-08-22 NOTE — Patient Instructions (Addendum)
Neck Arthritis: Exercises  Introduction  Here are some examples of exercises for you to try. The exercises may be suggested for a condition or for rehabilitation. Start each exercise slowly. Ease off the exercises if you start to have pain.  You will be told when to start these exercises and which ones will work best for you.  How to do the exercises  Neck stretches to the side   1. This stretch works best if you keep your shoulder down as you lean away from it. To help you remember to do this, start by relaxing your shoulders and lightly holding on to your thighs or your chair.  2. Tilt your head toward your shoulder and hold for 15 to 30 seconds. Let the weight of your head stretch your muscles.  3. Repeat 2 to 4 times toward each shoulder.    Chin tuck   1. Lie on the floor with a rolled-up towel under your neck. Your head should be touching the floor.  2. Slowly bring your chin toward your chest.  3. Hold for a count of 6, and then relax for up to 10 seconds.  4. Repeat 8 to 12 times.    Active cervical rotation   1. Sit in a firm chair, or stand up straight.  2. Keeping your chin level, turn your head to the right, and hold for 15 to 30 seconds.  3. Turn your head to the left and hold for 15 to 30 seconds.  4. Repeat 2 to 4 times to each side.    Shoulder blade squeeze   1. While standing, squeeze your shoulder blades together.  2. Do not raise your shoulders up as you are squeezing.  3. Hold for 6 seconds.  4. Repeat 8 to 12 times.    Shoulder rolls   1. Sit comfortably with your feet shoulder-width apart. You can also do this exercise standing up.  2. Roll your shoulders up, then back, and then down in a smooth, circular motion.  3. Repeat 2 to 4 times.    Follow-up care is a key part of your treatment and safety. Be sure to make and go to all appointments, and call your doctor if you are having problems. It's also a good idea to know your test results and keep a list of the medicines you take.   Where can you learn more?  Go to http://www.healthwise.net/[facility]  Enter V867 in the search box to learn more about "Neck Arthritis: Exercises."  Current as of: March 26, 2018??????????????????????????????Content Version: 12.5  ?? 2006-2020 [employer].   Care instructions adapted under license by [facility] (which disclaims liability or warranty for this information). If you have questions about a medical condition or this instruction, always ask your healthcare professional. [employer] disclaims any warranty or liability for your use of this information.           Low Back Arthritis: Exercises  Introduction  Here are some examples of typical rehabilitation exercises for your condition. Start each exercise slowly. Ease off the exercise if you start to have pain.  Your doctor or physical therapist will tell you when you can start these exercises and which ones will work best for you.  When you are not being active, find a comfortable position for rest. Some people are comfortable on the floor or a medium-firm bed with a small pillow under their head and another under their knees. Some people prefer to lie on their side   with a pillow between their knees. Don't stay in one position for too long.  Take short walks (10 to 20 minutes) every 2 to 3 hours. Avoid slopes, hills, and stairs until you feel better. Walk only distances you can manage without pain, especially leg pain.  How to do the exercises  Pelvic tilt   4. Lie on your back with your knees bent.  5. "Brace" your stomach???tighten your muscles by pulling in and imagining your belly button moving toward your spine.  6. Press your lower back into the floor. You should feel your hips and pelvis rock back.  7. Hold for 6 seconds while breathing smoothly.  8. Relax and allow your pelvis and hips to rock forward.  9. Repeat 8 to 12 times.    Back stretches   5. Get down on your hands and knees on the floor.   6. Relax your head and allow it to droop. Round your back up toward the ceiling until you feel a nice stretch in your upper, middle, and lower back. Hold this stretch for as long as it feels comfortable, or about 15 to 30 seconds.  7. Return to the starting position with a flat back while you are on your hands and knees.  8. Let your back sway by pressing your stomach toward the floor. Lift your buttocks toward the ceiling.  9. Hold this position for 15 to 30 seconds.  10. Repeat 2 to 4 times.    Follow-up care is a key part of your treatment and safety. Be sure to make and go to all appointments, and call your doctor if you are having problems. It's also a good idea to know your test results and keep a list of the medicines you take.  Where can you learn more?  Go to http://www.healthwise.net/[facility]  Enter T094 in the search box to learn more about "Low Back Arthritis: Exercises."  Current as of: March 26, 2018??????????????????????????????Content Version: 12.5  ?? 2006-2020 [employer].   Care instructions adapted under license by [facility] (which disclaims liability or warranty for this information). If you have questions about a medical condition or this instruction, always ask your healthcare professional. [employer] disclaims any warranty or liability for your use of this information.           Learning About the Mediterranean Diet  What is the Mediterranean diet?     The Mediterranean diet is a style of eating rather than a diet plan. It features foods eaten in [place], [place], [place] and [place], and other countries along the [place]. It emphasizes eating foods like fish, fruits, vegetables, beans, high-fiber breads and whole grains, nuts, and olive oil. This style of eating includes limited red meat, cheese, and sweets.  Why choose the Mediterranean diet?  A Mediterranean-style diet may improve heart health. It contains more fat  than other heart-healthy diets. But the fats are mainly from nuts, unsaturated oils (such as fish oils and olive oil), and certain nut or seed oils (such as canola, soybean, or flaxseed oil). These fats may help protect the heart and blood vessels.  How can you get started on the Mediterranean diet?  Here are some things you can do to switch to a more Mediterranean way of eating.  What to eat  ?? Eat a variety of fruits and vegetables each day, such as grapes, blueberries, tomatoes, broccoli, peppers, figs, olives, spinach, eggplant, beans, lentils, and chickpeas.  ?? Eat a variety of   whole-grain foods each day, such as oats, brown rice, and whole wheat bread, pasta, and couscous.  ?? Eat fish at least 2 times a week. Try tuna, salmon, mackerel, lake trout, herring, or sardines.  ?? Eat moderate amounts of low-fat dairy products, such as milk, cheese, or yogurt.  ?? Eat moderate amounts of poultry and eggs.  ?? Choose healthy (unsaturated) fats, such as nuts, olive oil, and certain nut or seed oils like canola, soybean, and flaxseed.  ?? Limit unhealthy (saturated) fats, such as butter, palm oil, and coconut oil. And limit fats found in animal products, such as meat and dairy products made with whole milk. Try to eat red meat only a few times a month in very small amounts.  ?? Limit sweets and desserts to only a few times a week. This includes sugar-sweetened drinks like soda.  The Mediterranean diet may also include red wine with your meal???1 glass each day for women and up to 2 glasses a day for men.  Tips for eating at home  ?? Use herbs, spices, garlic, lemon zest, and citrus juice instead of salt to add flavor to foods.  ?? Add avocado slices to your sandwich instead of bacon.  ?? Have fish for lunch or dinner instead of red meat. Brush the fish with olive oil, and broil or grill it.  ?? Sprinkle your salad with seeds or nuts instead of cheese.  ?? Cook with olive or canola oil instead of butter or oils that are high in  saturated fat.  ?? Switch from 2% milk or whole milk to 1% or fat-free milk.  ?? Dip raw vegetables in a vinaigrette dressing or hummus instead of dips made from mayonnaise or sour cream.  ?? Have a piece of fruit for dessert instead of a piece of cake. Try baked apples, or have some dried fruit.  Tips for eating out  ?? Try broiled, grilled, baked, or poached fish instead of having it fried or breaded.  ?? Ask your server to have your meals prepared with olive oil instead of butter.  ?? Order dishes made with marinara sauce or sauces made from olive oil. Avoid sauces made from cream or mayonnaise.  ?? Choose whole-grain breads, whole wheat pasta and pizza crust, brown rice, beans, and lentils.  ?? Cut back on butter or margarine on bread. Instead, you can dip your bread in a small amount of olive oil.  ?? Ask for a side salad or grilled vegetables instead of french fries or chips.  Where can you learn more?  Go to http://www.healthwise.net/[facility]  Enter O407 in the search box to learn more about "Learning About the Mediterranean Diet."  Current as of: September 14, 2017??????????????????????????????Content Version: 12.5  ?? 2006-2020 [employer].   Care instructions adapted under license by [facility] (which disclaims liability or warranty for this information). If you have questions about a medical condition or this instruction, always ask your healthcare professional. [employer] disclaims any warranty or liability for your use of this information.

## 2018-08-22 NOTE — Progress Notes (Signed)
[facility]  [address]  [place], [place] [address]  Phone: [phone]  Fax: [phone]    Pt's date of birth: [birth year]    ASSESSMENT   Diagnoses and all orders for this visit:    1. Lumbar radiculopathy  -     pregabalin (Lyrica) 150 mg capsule; Take 1 Cap by mouth three (3) times daily. Max Daily Amount: 450 mg.    2. Lumbar facet arthropathy  -     celecoxib (CeleBREX) 200 mg capsule; Take 1 cap by mouth daily as needed for pain with meals.    3. Muscle spasm  -     cyclobenzaprine (FLEXERIL) 10 mg tablet; Take 1 tab by mouth BID-TID as needed for muscle spasm    4. Chronic pain of right knee    5. Sleep apnea, unspecified type    6. Tobacco use    7. BMI 39.0-39.9,adult         IMPRESSION AND PLAN:  Jim Shaw is a 48 y.o. male with history of cervical and lumbar. He complains of pain in the lower back that radiates down the right leg to the foot, unchanged since his last office visit. He increased his Lyrica 75 mg to 2 caps TID but he denies any change in his symptoms. Pt also takes Celebrex 200 mg 2 caps QHS. He notes minimal relief with Parafon Forte 500 mg.    1) Pt was given information on cervical and lumbar arthritis exercises.   2) He will increase his Lyrica 150 mg to 1 cap TID.  3) Pt will decrease his Celebrex 200 mg to 1 cap daily .  4) He was prescribed Flexeril 10 mg 1 tab BID-TID prn muscle spasm.  5) I recommended that the patient lose weight and he was given information on the Mediterranean diet.   5) Jim Shaw has a reminder for a "due or due soon" health maintenance. I have asked that he contact his primary care provider, Jim Decree, MD, for follow-up on this health maintenance.  6) PMP demonstrated consistency with prescribing.  7) Smoking cessation also recommended  Follow-up and Dispositions    ?? Return in about 3 months (around 11/22/2018) for Medication follow up.              HISTORY OF PRESENT ILLNESS:  Jim Shaw is a 48 y.o. male with history of cervical and lumbar pain and presents to the office today for follow up. He complains of pain in the lower back that radiates down the right leg to the foot, unchanged since his last office visit. Pt reports increased aching pain, particularly with cold and rainy weather. He notes that on Monday, he urinated on himself when he was unable to make it to the bathroom in time. Pt reports that he was followed by urology in 02/2018 and according to the patient, he has narrowing of the urethra. Pt notes that he has not discussed his recent episode of incontinence with his PCP, Jim Decree, MD, but reports experiencing similar episodes over the years. He followed Dr. Liliane Channel regarding his hip and knee pain and received injections. Pt admits to improvement in his hip pain with the injection but notes continued pain in the right knee. He states that his knee occasionally gives out. Pt admits to difficulty with ambulation and presents to the office today wearing a hinged knee support. He notes that he does not have to ambulate  with the assistance of his cane when wearing the knee support. Pt is currently enrolled in knee physical therapy and notes that he will be fitted for a new knee support. He increased his Lyrica 75 mg to 2 caps TID but he denies any change in his symptoms. Pt also takes Celebrex 200 mg 2 caps QHS. He notes minimal relief with Parafon Forte 500 mg. Pt reports that his chart indicated he is allergic to Percocet, but patient denies any allergy to Percocet. He admits to difficulty with sleep secondary to sleep apnea and anxiety. Pt at this time desires to proceed with medication evaluation.    Pt notes that he will be moving to [name] within the next [place]pr[place].     Pain Scale: 8/10    PCP: Jim DecreeZamani, Faraaz R, MD     Past Medical History:   Diagnosis Date   ??? Anxiety    ??? Costochondritis    ??? Depression     ??? Major depressive disorder 11/08/2017   ??? Psychiatric disorder    ??? PTSD (post-traumatic stress disorder)         Social History     Socioeconomic History   ??? Marital status: SINGLE     Spouse name: Not on file   ??? Number of children: Not on file   ??? Years of education: Not on file   ??? Highest education level: Not on file   Occupational History   ??? Not on file   Social Needs   ??? Financial resource strain: Not on file   ??? Food insecurity     Worry: Not on file     Inability: Not on file   ??? Transportation needs     Medical: Not on file     Non-medical: Not on file   Tobacco Use   ??? Smoking status: Current Every Day Smoker     Packs/day: 0.25     Years: 20.00     Pack years: 5.00   ??? Smokeless tobacco: Never Used   Substance and Sexual Activity   ??? Alcohol use: Yes     Comment: 5-6 shots/day   ??? Drug use: Yes     Types: Marijuana   ??? Sexual activity: Not on file   Lifestyle   ??? Physical activity     Days per week: Not on file     Minutes per session: Not on file   ??? Stress: Not on file   Relationships   ??? Social Wellsite geologistconnections     Talks on phone: Not on file     Gets together: Not on file     Attends religious service: Not on file     Active member of club or organization: Not on file     Attends meetings of clubs or organizations: Not on file     Relationship status: Not on file   ??? Intimate partner violence     Fear of current or ex partner: Not on file     Emotionally abused: Not on file     Physically abused: Not on file     Forced sexual activity: Not on file   Other Topics Concern   ??? Military Service Not Asked   ??? Blood Transfusions Not Asked   ??? Caffeine Concern Not Asked   ??? Occupational Exposure Not Asked   ??? Hobby Hazards Not Asked   ??? Sleep Concern Not Asked   ??? Stress Concern Not Asked   ??? Weight Concern  Not Asked   ??? Special Diet Not Asked   ??? Back Care Not Asked   ??? Exercise Not Asked   ??? Bike Helmet Not Asked   ??? Seat Belt Not Asked   ??? Self-Exams Not Asked   Social History Narrative   ??? Not on file        Current Outpatient Medications   Medication Sig Dispense Refill   ??? omeprazole (PRILOSEC) 20 mg capsule Take 20 mg by mouth daily.     ??? pregabalin (Lyrica) 150 mg capsule Take 1 Cap by mouth three (3) times daily. Max Daily Amount: 450 mg. 90 Cap 5   ??? celecoxib (CeleBREX) 200 mg capsule Take 1 cap by mouth daily as needed for pain with meals. 30 Cap 5   ??? cyclobenzaprine (FLEXERIL) 10 mg tablet Take 1 tab by mouth BID-TID as needed for muscle spasm 60 Tab 2   ??? eszopiclone (LUNESTA) 3 mg tablet Take  by mouth nightly.     ??? DULoxetine (CYMBALTA) 60 mg capsule TK ONE C PO D     ??? chlorzoxazone (Parafon Forte DSC) 500 mg tablet Take 1 tab by mouth BID-TID as needed for muscle spasm. 60 Tab 1   ??? pregabalin (Lyrica) 75 mg capsule Take 1-2 caps by mouth two (2) times daily as directed. Max Daily Amount: 300 mg.  Indications: disorder characterized by stiff, tender & painful muscles 120 Cap 1   ??? diazePAM (VALIUM) 10 mg tablet TK 1 T PO  QHS     ??? lamoTRIgine (LaMICtal) 25 mg tablet Take 100 mg by mouth nightly.     ??? triamcinolone acetonide (KENALOG) 0.1 % ointment      ??? fluticasone propionate (FLONASE) 50 mcg/actuation nasal spray 2 Sprays by Both Nostrils route daily.     ??? ciclopirox (PENLAC) 8 % solution Apply  to affected area nightly.     ??? hydrOXYzine HCl (ATARAX) 50 mg tablet Take 50 mg by mouth as needed for Anxiety.     ??? prazosin (MINIPRESS) 1 mg capsule TK 1 C PO QHS     ??? ramelteon (ROZEREM) 8 mg tablet TK 1 T PO HS     ??? risperiDONE (RisperDAL) 1 mg tablet TK 2 TS PO HS     ??? tiZANidine (ZANAFLEX) 4 mg tablet Take 1 tab by mouth BID-TID as needed for muscle spasm. 60 Tab 1   ??? mirtazapine (REMERON) 15 mg tablet TK 1 T PO HS     ??? celecoxib (CELEBREX) 100 mg capsule Take 1 Cap by mouth daily.     ??? tiZANidine (ZANAFLEX) 4 mg tablet Take 1 Tab by mouth three (3) times daily. 90 Tab 1   ??? pregabalin (LYRICA) 100 mg capsule Take 1 Cap by mouth three (3) times daily. Max Daily Amount: 300 mg. 90 Cap 1    ??? gabapentin (NEURONTIN) 600 mg tablet Take 600 mg by mouth three (3) times daily.     ??? venlafaxine-SR (EFFEXOR-XR) 150 mg capsule Take 1 Cap by mouth daily (with breakfast). Indications: Anxiousness associated with Depression 15 Cap 1   ??? QUEtiapine (SEROQUEL) 50 mg tablet Take 1 Tab by mouth nightly. Indications: Additional Medications to Treat Depression (Patient not taking: Reported on 05/23/2018) 1 Tab 0       Allergies   Allergen Reactions   ??? Oxycodone-Acetaminophen Anaphylaxis and Swelling   ??? Duloxetine Hcl Rash   ??? Aspirin Other (comments)   ??? Cortisone Nausea and Vomiting  Also rash    ??? Ibuprofen Rash         REVIEW OF SYSTEMS    Constitutional: Negative for fever, chills, or weight change.   Respiratory: Negative for cough or shortness of breath.     Cardiovascular: Negative for chest pain or palpitations.  Gastrointestinal: Negative for acid reflux, change in bowel habits, or constipation.  Genitourinary: Negative for dysuria and flank pain.   Musculoskeletal: Positive for lumbar and knee pain.   Neurological: Negative for headaches, dizziness, or numbness.  Endo/Heme/Allergies: Negative for increased bruising.   Psychiatric/Behavioral: Positive for difficulty with sleep.    As per HPI    PHYSICAL EXAMINATION  Visit Vitals  BP 114/78 (BP 1 Location: Left arm, BP Patient Position: Sitting)   Pulse 75   Temp 98.1 ??F (36.7 ??C) (Oral)   Ht 6' (1.829 m)   Wt 290 lb 9.6 oz (131.8 kg)   SpO2 97%   BMI 39.41 kg/m??       Constitutional: Awake, alert, and in no acute distress.  Neurological: 1+ symmetrical DTRs in the upper extremities. 1+ symmetrical DTRs in the lower extremities. Sensation to light touch is intact. Negative Hoffman's sign bilaterally.  Skin: warm, dry, and intact.   Musculoskeletal: Tight across the upper trapezius bilaterally. Tenderness to palpation in the lower lumbar region. Decreased range of motion with side to side cervical flexion. Moderate pain with extension and axial  loading. No pain with internal or external rotation of his hips. Negative straight leg raise bilaterally.    Patient ambulates with the assistance of a single point cane. He presents to the office wearing a hinged right knee support.       Biceps  Triceps Deltoids Wrist Ext Wrist Flex Hand Intrin   Right +4/5 +4/5 +4/5 +4/5 +4/5 +4/5   Left +4/5 +4/5 +4/5 +4/5 +4/5 +4/5      Hip Flex  Quads Hamstrings Ankle DF EHL Ankle PF   Right 4/5 4/5 4/5 4/5 4/5 4/5   Left +4 to -5/5 +4 to -5/5 +4 to -5/5 +4 to -5/5 +4 to -5/5 +4 to -5/5     IMAGING:    Hip and??knee x-rays from 04/10/2018 were??personally reviewed with the patient and demonstrated:  Minimal??degenerative??changes.??  ??  Cervical MRI from 10/13/2017 was personally reviewed with the patient and demonstrated:  ??   Interface, [facility] Rad Res - 10/13/2017 ??4:00 PM EDT  EXAM: MRI of cervical spine without contrast    CLINICAL INDICATION/HISTORY: M54.41: Low back pain with right-sided sciatica    COMPARISON: None    TECHNIQUE: Multiplanar multi-sequential imaging of the cervical spine without contrast. T1W, T2W and STIR sequences were obtained in the sagittal plane. T2W and GRE sequences were obtained in the axial plane.    FINDINGS:     Postoperative changes: None.    Alignment: Overall straightening of normal cervical lordosis. No focal listhesis.    Vertebral body heights: Preserved. No edema.  -Mild intervertebral disc space narrowing at C5-6 and C4-5. Slight disc bulge at several levels.    Marrow signal: No edema.    Cord: Normal caliber. No syrinx or edema.    Cerebellar tonsils: No Chiari 1 malformation.      Correlation of axial and sagittal data through the disc levels:    -C2-3: No significant canal or foraminal stenosis    -C3-4: Mild broad-based posterior disc bulge effaces fluid space and contributes to mild central canal narrowing to 10 mm. No significant foraminal narrowing.    -  C4-5: Minimal broad-based posterior disc bulge. No significant canal  narrowing.   -Mild to moderate right foraminal stenosis due to asymmetric disc bulge and some uncovertebral spur.    -C5-6: Broad-based posterior disc bulge effaces fluid space and may contact the left ventral cervical cord. No edema. Mild central canal narrowing to 9.5 mm.   -There is mild left lateral recess narrowing and mild to borderline moderate left foraminal narrowing due to disc bulge and uncovertebral spur.    -C6-7: No significant canal or foraminal stenosis.    -C7-T1: No significant canal or foraminal stenosis.      IMPRESSION  1. Straightened cervical lordosis. No compression deformity, edema or listhesis.    2. Multilevel degenerative discogenic disease with mild degree of canal narrowing at several levels.  -Ventral cord contact and minimal distortion due to disc bulge C5-C6 as above.    3. Varying degrees of bilateral foraminal narrowing most significant right C4-5 and left C5-6.    Signed By: Emilia Beckichard J Thomas, MD on 10/13/2017 3:58 PM   ??  ??  ??  Lumbar MRI from 10/13/2017 was personally reviewed with the patient and demonstrated:  ??   Interface, [facility] Rad Res - 10/13/2017 ??4:37 PM EDT  EXAM: MR LUMBAR SPINE WITHOUT CONTRAST    CLINICAL INDICATION/HISTORY: ??M54.41: Low back pain with right-sided sciatica    COMPARISON: None    TECHNIQUE: Multiplanar multi-sequential imaging of the lumbar spine without contrast. T1W, T2W and STIR sequences were obtained in the sagittal plane. Stacked axial T2 and axial T1W through the lower 3 disc levels.    FINDINGS:     Postoperative : None.    Vertebral alignment: Mild straightening of lumbar lordosis with trace retrolisthesis of L5. No spondylolysis.    Vertebral body heights: Normal. Mild endplate concavity in L5 and L4.    Marrow signal: Normal.    Cord: Conus medullaris terminates at the L1-L2 level with normal signal.    Soft tissues: Normal.    Correlation of axial and sagittal data through the disc levels:     -L1-2: No significant disc pathology. No spinal canal or foraminal stenosis.    -L2-3: No significant disc pathology. Mild facet and ligamentous hypertrophy with bilateral facet joint effusion. No central or foraminal stenosis    -L3-4: No significant disc pathology. Similarly, mild bilateral facet and ligamentous hypertrophy with small facet joint effusion. No central or foraminal stenosis.    -L4-5: Mild posterior disc bulge. No central stenosis. More severe facet and ligamentous hypertrophy with mild facet inflammation. No significant foraminal stenosis    -L5-S1: Disc bulge most prominent. Still no severe central stenosis. Facet and ligamentous hypertrophy with inflammation. Moderate bilateral foraminal stenosis. Mild compression of bilateral exiting L5 nerve root suspected.    IMPRESSION  Posterior disc bulge and facet/ligamentum flavum hypertrophy most prominent at L5-S1. Moderate bilateral foraminal stenosis with suspected mild compression bilateral exiting L5 nerve roots. Facet hypertrophy and inflammation throughout.    Signed By: Gretel Acrehan V Nguyen, MD on 10/13/2017 4:35 PM   ??  ??  Right upper extremity EMG from 12/27/2008 was personally reviewed with the patient and demonstrated:  NCV & EMG Findings:  All nerve conduction studies (as indicated in the following tables) were within normal limits. ??  ??  All examined muscles (as indicated in the following table) showed no evidence of electrical instability.????  ??  INTERPRETATION  This was a normal nerve conduction and EMG study showing there to be no signs of neuropathy,  myopathy, or radiculopathy in the nerves and muscles tested.??  ??  CLINICAL INTERPRETATION  There are no electrodiagnostic findings correlating with his symptoms.??      Written by Sebastian Ache, [place], as dictated by Delorise Leylany Nored, MD.  I, Dr. Delorise Goran Olden confirm that all documentation is accurate.

## 2018-08-24 ENCOUNTER — Inpatient Hospital Stay: Admit: 2018-08-24 | Payer: MEDICAID | Primary: Family Medicine
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
## 2018-08-24 NOTE — Progress Notes (Signed)
PT DAILY TREATMENT NOTE 8-14    Patient Name: Jim Shaw  Date:08/24/2018  DOB: [birth year]  [x]   Patient DOB Verified  Payor: [employer] / Plan: [place] / Product Type: Managed Care Medicaid /    In time: 8:03 am      Out time: 9:06  Total Treatment Time (min):  63  Total Timed Codes (min): 53  1:1 Treatment Time (min): 53  Visit #: 6 of 8    Treatment Area: Radiculopathy, lumbar region [M54.16]  Spinal stenosis, cervical region [M48.02]  Pain in right knee [M25.561]  Other chronic pain [G89.29]  Pain in left hip [M25.552]    SUBJECTIVE  Pain Level (0-10 scale): 8.5/10  Any medication changes, allergies to medications, adverse drug reactions, diagnosis change, or new procedure performed?: [x]  No    []  Yes (see summary sheet for update)  Subjective functional status/changes:   []  No changes reported  Pt reports increased soreness and generally achy for unknown reason.     OBJECTIVE  Modality rationale: decrease pain and increase tissue extensibility to improve the patient???s ability to tolerate ambulation, transfers, and ADL's   Min Type Additional Details   10 []  Estim: [] Att   [x] Unatt  [] TENS instruct                 [x] IFC  [] Premod [] NMES                       [] Other:  [] w/US   [] w/ice   [x] w/heat  Position: semi-reclined  Location: lumbar spine    []   Traction: []  Cervical       [] Lumbar                       []  Prone          [] Supine                       [] Intermittent   [] Continuous Lbs:  []  before manual  []  after manual    []   Ultrasound: [] Continuous   []  Pulsed                           [] 1MHz   [] 3MHz Location:  W/cm2:    []   Iontophoresis with dexamethasone         Location: []  Take home patch   []  In clinic    []   Ice     []   heat  []   Ice massage Position:  Location:    []   Vasopneumatic Device Pressure: []  lo []  med []  hi   Temp: []  lo []  med []  hi   [x]  Skin assessment post-treatment:  [x] intact [] redness- no adverse reaction       [] redness ??? adverse reaction:      25 min Therapeutic Exercise:  [x]  See flow sheet: Including re-assessment and FOTO completion    Rationale: increase ROM and increase strength to improve the patient???s ability to walk, transfer, stand    12 min Therapeutic Activity:  [x]   See flow sheet:    Rationale:  improve coordination, improve balance and increase proprioception  to improve the patient???s ability to perform transfers       10 min Neuromuscular Re-education:  [x]   See flow sheet: re-assessment of SLS, ROM    Rationale: improve coordination, improve balance and increase proprioception  to improve the patient???s ability  to safely ambulate without AD and reduce fall risk     6 min Gait Training:  For re-assessment    Rationale:  increase proprioception, improve balance strategies, increase strength to improve the patient's ability to ambulate with reduced fall risk          X min Patient Education: [x]  Review HEP     Other Objective/Functional Measures:     FOTO: 43/100   Subjective % improvement: 20%   Improvements: walking short distances without the cane  Deficits: back pain, R knee pain, walking and standing endurance   Pain: (C) 8.5/10 (B) 8/10  (W) 10/10   Knee AROM - measured in sitting: (R) 5-98 , (L) 0-122  Strength: Hip flex R 3-, L 4+ , ext  , abd   Knee flex R 3-, L 4+ , ext R 3-, L 4+   SLR - only able raise leg 2 inches off of table   Bridge - unable   5xSTS: 61 seconds from standard chair height with B UE's   Gait: with SPC and R knee brace; slow cadence, shortened stride length, fluctuates between step to and step through gait pattern, trunk flexion - poor posture, occasional sporadic LOB  Squat: heavy reliance on UE's for support;  minimal squat depth ~45 degrees without  UE support   ROM: EO 18 sec, EC 2 sec  SLS: L 13 sec (moderate sway), R - unable to safely attempt    Pain Level (0-10 scale) post treatment: 7-8    ASSESSMENT/Changes in Function:   See PN.     Patient will continue to benefit from skilled PT services to modify  and progress therapeutic interventions, address functional mobility deficits, address ROM deficits, address strength deficits, analyze and address soft tissue restrictions, analyze and cue movement patterns, analyze and modify body mechanics/ergonomics, assess and modify postural abnormalities, address imbalance/dizziness and instruct in home and community integration to attain remaining goals.     [x]   See Plan of Care  []   See progress note/recertification  []   See Discharge Summary         Progress towards goals / Updated goals:  Short Term Goals:??To be accomplished in 1??weeks:  1) Goal: Patient will be independent and compliant with HEP in order to progress toward long term goals.  Status at last note/certification: Progressing; fair compliance - only performs 2 exercises (08/24/18)   Long Term Goals:??To be accomplished in 10??treatments:  1) Goal: Patient will improve FOTO assessment score to 46 pts in order to indicate improved functional abilities.  Status at last note/certification: Progressing: 43/100 - improved from 36 at IE (08/24/18)  2) Goal: Patient will improve BIL hip extension to 10 deg for improved posture and gait mechanics  Status at last note/certification: 0 deg   Current: 0 deg (08/24/18)   3) Goal: Patient will demonstrate ability to squat to 90 deg hip flexion or better for proper lifting  Status at last note/certification: 295 deg   Current: 45 deg without UE, 60 deg with UE support (08/24/18)   4) Goal: Patient will report overall at least 65% improvement in function in order to progress toward premorbid status.  Status at last note/certification: Progressing: reports 20% improvement (08/24/18)   5) Goal: Patient will report ability to walk/stand for 30 mins to buy groceries  Status at last note/certification: Goal met: reports ability to walk/stand for 30 minutes (08/24/18)    PLAN  [x]   Upgrade activities as tolerated     [  x]  Continue plan of care  []   Update interventions per flow sheet        []   Discharge due to:_  []   Other:_  Cont 2x/wk for 4 weeks.     Almyra Deforest, PT 08/24/2018
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
## 2018-08-24 NOTE — Progress Notes (Signed)
PT DAILY TREATMENT NOTE 8-14    Patient Name: Jim Shaw  Date:08/24/2018  DOB: [birth year]  [x]   Patient DOB Verified  Payor: [employer] / Plan: [place] / Product Type: Managed Care Medicaid /    In time: 8:03 am      Out time: 9:06  Total Treatment Time (min):  63  Total Timed Codes (min): 53  1:1 Treatment Time (min): 53  Visit #: 6 of 8    Treatment Area: Radiculopathy, lumbar region [M54.16]  Spinal stenosis, cervical region [M48.02]  Pain in right knee [M25.561]  Other chronic pain [G89.29]  Pain in left hip [M25.552]    SUBJECTIVE  Pain Level (0-10 scale): 8.5/10  Any medication changes, allergies to medications, adverse drug reactions, diagnosis change, or new procedure performed?: [x]  No    []  Yes (see summary sheet for update)  Subjective functional status/changes:   []  No changes reported  Pt reports increased soreness and generally achy for unknown reason.     OBJECTIVE  Modality rationale: decrease pain and increase tissue extensibility to improve the patient???s ability to tolerate ambulation, transfers, and ADL's   Min Type Additional Details   10 []  Estim: [] Att   [x] Unatt  [] TENS instruct                 [x] IFC  [] Premod [] NMES                       [] Other:  [] w/US   [] w/ice   [x] w/heat  Position: semi-reclined  Location: lumbar spine    []   Traction: []  Cervical       [] Lumbar                       []  Prone          [] Supine                       [] Intermittent   [] Continuous Lbs:  []  before manual  []  after manual    []   Ultrasound: [] Continuous   []  Pulsed                           [] 1MHz   [] 3MHz Location:  W/cm2:    []   Iontophoresis with dexamethasone         Location: []  Take home patch   []  In clinic    []   Ice     []   heat  []   Ice massage Position:  Location:    []   Vasopneumatic Device Pressure: []  lo []  med []  hi   Temp: []  lo []  med []  hi   [x]  Skin assessment post-treatment:  [x] intact [] redness- no adverse reaction       [] redness ??? adverse reaction:      25 min Therapeutic Exercise:  [x]  See flow sheet: Including re-assessment and FOTO completion    Rationale: increase ROM and increase strength to improve the patient???s ability to walk, transfer, stand    12 min Therapeutic Activity:  [x]   See flow sheet:    Rationale:  improve coordination, improve balance and increase proprioception  to improve the patient???s ability to perform transfers       10 min Neuromuscular Re-education:  [x]   See flow sheet: re-assessment of SLS, ROM    Rationale: improve coordination, improve balance and increase proprioception  to improve the patient???s ability  to safely ambulate without AD and reduce fall risk     6 min Gait Training:  For re-assessment    Rationale:  increase proprioception, improve balance strategies, increase strength to improve the patient's ability to ambulate with reduced fall risk          X min Patient Education: '[x]'$  Review HEP     Other Objective/Functional Measures:     FOTO: 43/100   Subjective % improvement: 20%   Improvements: walking short distances without the cane  Deficits: back pain, R knee pain, walking and standing endurance   Pain: (C) 8.5/10 (B) 8/10  (W) 10/10   Knee AROM - measured in sitting: (R) 5-98 , (L) 0-122  Strength: Hip flex R 3-, L 4+ , ext  , abd   Knee flex R 3-, L 4+ , ext R 3-, L 4+   SLR - only able raise leg 2 inches off of table   Bridge - unable   5xSTS: 61 seconds from standard chair height with B UE's   Gait: with SPC and R knee brace; slow cadence, shortened stride length, fluctuates between step to and step through gait pattern, trunk flexion - poor posture, occasional sporadic LOB  Squat: heavy reliance on UE's for support;  minimal squat depth ~45 degrees without  UE support   ROM: EO 18 sec, EC 2 sec  SLS: L 13 sec (moderate sway), R - unable to safely attempt    Pain Level (0-10 scale) post treatment: 7-8    ASSESSMENT/Changes in Function:   See PN.     Patient will continue to benefit from skilled PT services to modify and  progress therapeutic interventions, address functional mobility deficits, address ROM deficits, address strength deficits, analyze and address soft tissue restrictions, analyze and cue movement patterns, analyze and modify body mechanics/ergonomics, assess and modify postural abnormalities, address imbalance/dizziness and instruct in home and community integration to attain remaining goals.     '[x]'$   See Plan of Care  '[]'$   See progress note/recertification  '[]'$   See Discharge Summary         Progress towards goals / Updated goals:  Short Term Goals:??To be accomplished in 1??weeks:  1) Goal: Patient will be independent and compliant with HEP in order to progress toward long term goals.  Status at last note/certification: Progressing; fair compliance - only performs 2 exercises (08/24/18)   Long Term Goals:??To be accomplished in 10??treatments:  1) Goal: Patient will improve FOTO assessment score to 46 pts in order to indicate improved functional abilities.  Status at last note/certification: Progressing: 43/100 - improved from 36 at IE (08/24/18)  2) Goal: Patient will improve BIL hip extension to 10 deg for improved posture and gait mechanics  Status at last note/certification: 0 deg   Current: 0 deg (08/24/18)   3) Goal: Patient will demonstrate ability to squat to 90 deg hip flexion or better for proper lifting  Status at last note/certification: 678 deg   Current: 45 deg without UE, 60 deg with UE support (08/24/18)   4) Goal: Patient will report overall at least 65% improvement in function in order to progress toward premorbid status.  Status at last note/certification: Progressing: reports 20% improvement (08/24/18)   5) Goal: Patient will report ability to walk/stand for 30 mins to buy groceries  Status at last note/certification: Goal met: reports ability to walk/stand for 30 minutes (08/24/18)    PLAN  '[x]'$   Upgrade activities as tolerated     [  x]  Continue plan of care  []   Update interventions per flow sheet        []   Discharge due to:_  []   Other:_  Cont 2x/wk for 4 weeks.     Almyra Deforest, PT 08/24/2018

## 2018-08-29 ENCOUNTER — Ambulatory Visit
Admit: 2018-08-29 | Discharge: 2018-08-29 | Payer: PRIVATE HEALTH INSURANCE | Attending: Orthopaedic Surgery | Primary: Family Medicine

## 2018-08-29 ENCOUNTER — Ambulatory Visit: Attending: Orthopaedic Surgery | Primary: Family Medicine

## 2018-08-29 DIAGNOSIS — G5601 Carpal tunnel syndrome, right upper limb: Secondary | ICD-10-CM

## 2018-08-29 MED ORDER — TRIAMCINOLONE ACETONIDE 10 MG/ML SUSP FOR INJECTION
10 mg/mL | Freq: Once | INTRAMUSCULAR | 0 refills | Status: AC
Start: 2018-08-29 — End: 2018-08-29

## 2018-08-29 NOTE — Progress Notes (Signed)
Jim Shaw is a 48 y.o. male right handed unknown employment.  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    Vitals:    08/29/18 0918   BP: 114/74   Pulse: 84   Temp: 97.2 ??F (36.2 ??C)   Weight: 289 lb 9.6 oz (131.4 kg)   Height: 6' (1.829 m)   PainSc:   7   PainLoc: Wrist           Chief Complaint   Patient presents with   ??? Wrist Pain     Rt       HPI: Patient comes in today for follow-up of right hand numbness and tingling.  He is previously had injections and at his last visit we discussed surgical release which he said he needed to think about.  He says he is still not ready for surgery at this time but may consider an injection today.  He is also previously had a right index finger trigger injection that he says has resolved.    Previous HPI: Follow-up for bilateral hand numbness and right index finger locking s/p right index finger A1 Pulley injection and right carpal tunnel injection    Initial HPI: Patient comes in today with a history of right hand numbness and tingling as well as pain and locking in his right index finger.    Date of onset:  indeterminate    Injury: No    Prior Treatment:  Yes: Comment: right index finger A1 Pulley injection and right carpal tunnel injection    Numbness/ Tingling: Yes: Comment: right hand    ROS: Review of Systems - General ROS: negative  Psychological ROS: negative  Allergy and Immunology ROS: negative  Hematological and Lymphatic ROS: negative  Respiratory ROS: no cough, shortness of breath, or wheezing  Cardiovascular ROS: no chest pain or dyspnea on exertion  Gastrointestinal ROS: no abdominal pain, change in bowel habits, or black or bloody stools  Musculoskeletal ROS: positive for - pain in hand - right  Neurological ROS: positive for - numbness/tingling  Dermatological ROS: negative    Past Medical History:   Diagnosis Date   ??? Anxiety    ??? Costochondritis    ??? Depression    ??? Major depressive disorder 11/08/2017   ??? Psychiatric  disorder    ??? PTSD (post-traumatic stress disorder)        Past Surgical History:   Procedure Laterality Date   ??? HX ORTHOPAEDIC      Surgery on knee (right knee)        Current Outpatient Medications   Medication Sig Dispense Refill   ??? triamcinolone acetonide (KENALOG) 10 mg/mL injection 1 mL by Intra artICUlar route once for 1 dose. 1 Vial 0   ??? omeprazole (PRILOSEC) 20 mg capsule Take 20 mg by mouth daily.     ??? pregabalin (Lyrica) 150 mg capsule Take 1 Cap by mouth three (3) times daily. Max Daily Amount: 450 mg. 90 Cap 5   ??? celecoxib (CeleBREX) 200 mg capsule Take 1 cap by mouth daily as needed for pain with meals. 30 Cap 5   ??? cyclobenzaprine (FLEXERIL) 10 mg tablet Take 1 tab by mouth BID-TID as needed for muscle spasm 60 Tab 2   ??? eszopiclone (LUNESTA) 3 mg tablet Take  by mouth nightly.     ??? DULoxetine (CYMBALTA) 60 mg capsule TK ONE C PO D     ??? chlorzoxazone (Parafon Forte DSC) 500 mg tablet Take 1 tab by mouth  BID-TID as needed for muscle spasm. 60 Tab 1   ??? diazePAM (VALIUM) 10 mg tablet TK 1 T PO  QHS     ??? lamoTRIgine (LaMICtal) 25 mg tablet Take 100 mg by mouth nightly.     ??? ramelteon (ROZEREM) 8 mg tablet TK 1 T PO HS     ??? risperiDONE (RisperDAL) 1 mg tablet TK 2 TS PO HS     ??? triamcinolone acetonide (KENALOG) 0.1 % ointment      ??? fluticasone propionate (FLONASE) 50 mcg/actuation nasal spray 2 Sprays by Both Nostrils route daily.     ??? ciclopirox (PENLAC) 8 % solution Apply  to affected area nightly.     ??? hydrOXYzine HCl (ATARAX) 50 mg tablet Take 50 mg by mouth as needed for Anxiety.     ??? prazosin (MINIPRESS) 1 mg capsule TK 1 C PO QHS     ??? pregabalin (Lyrica) 75 mg capsule Take 1-2 caps by mouth two (2) times daily as directed. Max Daily Amount: 300 mg.  Indications: disorder characterized by stiff, tender & painful muscles 120 Cap 1   ??? tiZANidine (ZANAFLEX) 4 mg tablet Take 1 tab by mouth BID-TID as needed for muscle spasm. 60 Tab 1   ??? mirtazapine (REMERON) 15 mg tablet TK 1 T PO HS      ??? celecoxib (CELEBREX) 100 mg capsule Take 1 Cap by mouth daily.     ??? tiZANidine (ZANAFLEX) 4 mg tablet Take 1 Tab by mouth three (3) times daily. 90 Tab 1   ??? pregabalin (LYRICA) 100 mg capsule Take 1 Cap by mouth three (3) times daily. Max Daily Amount: 300 mg. 90 Cap 1   ??? gabapentin (NEURONTIN) 600 mg tablet Take 600 mg by mouth three (3) times daily.     ??? venlafaxine-SR (EFFEXOR-XR) 150 mg capsule Take 1 Cap by mouth daily (with breakfast). Indications: Anxiousness associated with Depression 15 Cap 1   ??? QUEtiapine (SEROQUEL) 50 mg tablet Take 1 Tab by mouth nightly. Indications: Additional Medications to Treat Depression (Patient not taking: Reported on 05/23/2018) 1 Tab 0       Allergies   Allergen Reactions   ??? Oxycodone-Acetaminophen Anaphylaxis and Swelling     Pt denies allergy   ??? Duloxetine Hcl Rash   ??? Aspirin Other (comments)   ??? Cortisone Nausea and Vomiting     Also rash    ??? Ibuprofen Rash         PE:     Physical Exam  Vitals signs and nursing note reviewed.   Constitutional:       General: He is not in acute distress.     Appearance: Normal appearance. He is not ill-appearing.   Eyes:      Pupils: Pupils are equal, round, and reactive to light.   Neck:      Musculoskeletal: Normal range of motion.   Cardiovascular:      Pulses: Normal pulses.   Pulmonary:      Effort: Pulmonary effort is normal. No respiratory distress.   Abdominal:      General: Abdomen is flat.   Musculoskeletal: Normal range of motion.         General: No swelling, tenderness, deformity or signs of injury.      Right lower leg: No edema.      Left lower leg: No edema.   Skin:     General: Skin is warm and dry.      Capillary Refill: Capillary  refill takes less than 2 seconds.      Findings: No bruising or erythema.   Neurological:      General: No focal deficit present.      Mental Status: He is alert and oriented to person, place, and time.      Cranial Nerves: No cranial nerve deficit.      Sensory: No sensory deficit.    Psychiatric:         Mood and Affect: Mood normal.         Behavior: Behavior normal.         NEUROVASCULAR    Examination L R Examination L R   Carpal Comp. - + Pronator Comp. - -   Carpal Tinel - + Pronator Tinel - -   Phalen's - + Pronator Stress - -   Cubital Comp. - - Guyon Comp. - -   Cubital Tinel - - Guyon Tinel - -   Elbow Hyperflexion - - Adson's - -   Spurling's - - SC Comp. - -   PCB Median abn - - SC Tinel - -   Radial Tinel - - IC Comp. - -   Digital Tinel - - IC Tinel - -   Radial 2-Pt WNL WNL Ulnar 2-Pt WNL WNL     Radial Pulse: 2+  Capillary Refill: < 2 sec  Allen: Not Performed  Digital Allen: Not Performed      Imaging: none indicated    02/2018 repeat EMG  NCS/EMG FINDINGS:  ?? Evaluation of the Left median motor, the Right median motor, the Left peroneal motor, the Right peroneal motor, the Left tibial motor, the Right tibial motor, the Left ulnar motor, the Right ulnar motor, the Left superficial peroneal sensory, the Right superficial peroneal sensory, the Left sural sensory, and the Right sural sensory nerves were unremarkable.  ?? The Left Median 2nd Digit sensory nerve showed reduced amplitude (12.5 ??V).  ?? The Right Median 2nd Digit sensory nerve showed prolonged distal peak latency (3.6 ms) and reduced amplitude (12.4 ??V).  ?? The Left ulnar sensory and the Right ulnar sensory nerves showed reduced amplitude (L8.6, R6.3 ??V) and decreased conduction velocity (Wrist-5th Digit, L45.8, R47.8 m/s).  ??  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   3-Normal EMG of RLE muscles.       12/2017  NCV & EMG Findings:  All nerve conduction studies (as indicated in the following tables) were within normal limits.    ??  All examined muscles (as indicated in the following table) showed no evidence of electrical instability.    ??  INTERPRETATION  This was a normal nerve  conduction and EMG study showing there to be no signs of neuropathy, myopathy, or radiculopathy in the nerves and muscles tested.   ??  CLINICAL INTERPRETATION  There are no electrodiagnostic findings correlating with his symptoms.       ICD-10-CM ICD-9-CM    1. Right carpal tunnel syndrome  G56.01 354.0 TRIAMCINOLONE ACETONIDE INJ      triamcinolone acetonide (KENALOG) 10 mg/mL injection      INJECT CARPAL TUNNEL       Plan:     At this point we will delay surgery at this time as he says he is not ready.  We will try another carpal tunnel injection.    Follow-up and Dispositions    ??  Return if symptoms worsen or fail to improve.         Plan was reviewed with patient, who verbalized agreement and understanding of the plan    [facility]  OFFICE PROCEDURE PROGRESS NOTE        Chart reviewed for the following:   I, Maygan Koeller A Audrianna Driskill, DO, have reviewed the History, Physical and updated the Allergic reactions for [facility]     TIME OUT performed immediately prior to start of procedure:   I, Jene Oravec A Dajha Urquilla, DO, have performed the following reviews [facility] prior to the start of the procedure:            * Patient was identified by name and date of birth   * Agreement on procedure being performed was verified  * Risks and Benefits explained to the patient  * Procedure site verified and marked as necessary  * Patient was positioned for comfort  * Consent was signed and verified     Time: 10 AM      Date of procedure: 08/29/2018    Procedure performed by:  Peggyann ShoalsShawn A Avalynne Diver, DO    Provider assisted by: Thana FarrJessica Castillo LPN    Patient assisted by: self    How tolerated by patient: tolerated the procedure well with no complications    Post Procedural Pain Scale: 0 - No Hurt    Comments: none    Procedure:  After consent was obtained, using sterile technique the carpal tunnel was prepped. Local anesthetic used: 1% lidocaine. Kenalog 5 mg and was then  injected and the needle withdrawn.  The procedure was well tolerated.  The patient is asked to continue to rest the area for a few more days before resuming regular activities.  It may be more painful for the first 1-2 days.  Watch for fever, or increased swelling or persistent pain in the joint. Call or return to clinic prn if such symptoms occur or there is failure to improve as anticipated.

## 2018-08-29 NOTE — Progress Notes (Signed)
Jim HickmanChristopher Dewayne Shaw is a 48 y.o. male right handed unknown employment.  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    Vitals:    08/29/18 0918   BP: 114/74   Pulse: 84   Temp: 97.2 ??F (36.2 ??C)   Weight: 289 lb 9.6 oz (131.4 kg)   Height: 6' (1.829 m)   PainSc:   7   PainLoc: Wrist           Chief Complaint   Patient presents with   ??? Wrist Pain     Rt       HPI: Patient comes in today for follow-up of right hand numbness and tingling.  He is previously had injections and at his last visit we discussed surgical release which he said he needed to think about.  He says he is still not ready for surgery at this time but may consider an injection today.  He is also previously had a right index finger trigger injection that he says has resolved.    Previous HPI: Follow-up for bilateral hand numbness and right index finger locking s/p right index finger A1 Pulley injection and right carpal tunnel injection    Initial HPI: Patient comes in today with a history of right hand numbness and tingling as well as pain and locking in his right index finger.    Date of onset:  indeterminate    Injury: No    Prior Treatment:  Yes: Comment: right index finger A1 Pulley injection and right carpal tunnel injection    Numbness/ Tingling: Yes: Comment: right hand    ROS: Review of Systems - General ROS: negative  Psychological ROS: negative  Allergy and Immunology ROS: negative  Hematological and Lymphatic ROS: negative  Respiratory ROS: no cough, shortness of breath, or wheezing  Cardiovascular ROS: no chest pain or dyspnea on exertion  Gastrointestinal ROS: no abdominal pain, change in bowel habits, or black or bloody stools  Musculoskeletal ROS: positive for - pain in hand - right  Neurological ROS: positive for - numbness/tingling  Dermatological ROS: negative    Past Medical History:   Diagnosis Date   ??? Anxiety    ??? Costochondritis    ??? Depression    ??? Major depressive disorder 11/08/2017    ??? Psychiatric disorder    ??? PTSD (post-traumatic stress disorder)        Past Surgical History:   Procedure Laterality Date   ??? HX ORTHOPAEDIC      Surgery on knee (right knee)        Current Outpatient Medications   Medication Sig Dispense Refill   ??? triamcinolone acetonide (KENALOG) 10 mg/mL injection 1 mL by Intra artICUlar route once for 1 dose. 1 Vial 0   ??? omeprazole (PRILOSEC) 20 mg capsule Take 20 mg by mouth daily.     ??? pregabalin (Lyrica) 150 mg capsule Take 1 Cap by mouth three (3) times daily. Max Daily Amount: 450 mg. 90 Cap 5   ??? celecoxib (CeleBREX) 200 mg capsule Take 1 cap by mouth daily as needed for pain with meals. 30 Cap 5   ??? cyclobenzaprine (FLEXERIL) 10 mg tablet Take 1 tab by mouth BID-TID as needed for muscle spasm 60 Tab 2   ??? eszopiclone (LUNESTA) 3 mg tablet Take  by mouth nightly.     ??? DULoxetine (CYMBALTA) 60 mg capsule TK ONE C PO D     ??? chlorzoxazone (Parafon Forte DSC) 500 mg tablet Take 1 tab by mouth  BID-TID as needed for muscle spasm. 60 Tab 1   ??? diazePAM (VALIUM) 10 mg tablet TK 1 T PO  QHS     ??? lamoTRIgine (LaMICtal) 25 mg tablet Take 100 mg by mouth nightly.     ??? ramelteon (ROZEREM) 8 mg tablet TK 1 T PO HS     ??? risperiDONE (RisperDAL) 1 mg tablet TK 2 TS PO HS     ??? triamcinolone acetonide (KENALOG) 0.1 % ointment      ??? fluticasone propionate (FLONASE) 50 mcg/actuation nasal spray 2 Sprays by Both Nostrils route daily.     ??? ciclopirox (PENLAC) 8 % solution Apply  to affected area nightly.     ??? hydrOXYzine HCl (ATARAX) 50 mg tablet Take 50 mg by mouth as needed for Anxiety.     ??? prazosin (MINIPRESS) 1 mg capsule TK 1 C PO QHS     ??? pregabalin (Lyrica) 75 mg capsule Take 1-2 caps by mouth two (2) times daily as directed. Max Daily Amount: 300 mg.  Indications: disorder characterized by stiff, tender & painful muscles 120 Cap 1   ??? tiZANidine (ZANAFLEX) 4 mg tablet Take 1 tab by mouth BID-TID as needed for muscle spasm. 60 Tab 1    ??? mirtazapine (REMERON) 15 mg tablet TK 1 T PO HS     ??? celecoxib (CELEBREX) 100 mg capsule Take 1 Cap by mouth daily.     ??? tiZANidine (ZANAFLEX) 4 mg tablet Take 1 Tab by mouth three (3) times daily. 90 Tab 1   ??? pregabalin (LYRICA) 100 mg capsule Take 1 Cap by mouth three (3) times daily. Max Daily Amount: 300 mg. 90 Cap 1   ??? gabapentin (NEURONTIN) 600 mg tablet Take 600 mg by mouth three (3) times daily.     ??? venlafaxine-SR (EFFEXOR-XR) 150 mg capsule Take 1 Cap by mouth daily (with breakfast). Indications: Anxiousness associated with Depression 15 Cap 1   ??? QUEtiapine (SEROQUEL) 50 mg tablet Take 1 Tab by mouth nightly. Indications: Additional Medications to Treat Depression (Patient not taking: Reported on 05/23/2018) 1 Tab 0       Allergies   Allergen Reactions   ??? Oxycodone-Acetaminophen Anaphylaxis and Swelling     Pt denies allergy   ??? Duloxetine Hcl Rash   ??? Aspirin Other (comments)   ??? Cortisone Nausea and Vomiting     Also rash    ??? Ibuprofen Rash         PE:     Physical Exam  Vitals signs and nursing note reviewed.   Constitutional:       General: He is not in acute distress.     Appearance: Normal appearance. He is not ill-appearing.   Eyes:      Pupils: Pupils are equal, round, and reactive to light.   Neck:      Musculoskeletal: Normal range of motion.   Cardiovascular:      Pulses: Normal pulses.   Pulmonary:      Effort: Pulmonary effort is normal. No respiratory distress.   Abdominal:      General: Abdomen is flat.   Musculoskeletal: Normal range of motion.         General: No swelling, tenderness, deformity or signs of injury.      Right lower leg: No edema.      Left lower leg: No edema.   Skin:     General: Skin is warm and dry.      Capillary Refill: Capillary  refill takes less than 2 seconds.      Findings: No bruising or erythema.   Neurological:      General: No focal deficit present.      Mental Status: He is alert and oriented to person, place, and time.       Cranial Nerves: No cranial nerve deficit.      Sensory: No sensory deficit.   Psychiatric:         Mood and Affect: Mood normal.         Behavior: Behavior normal.         NEUROVASCULAR    Examination L R Examination L R   Carpal Comp. - + Pronator Comp. - -   Carpal Tinel - + Pronator Tinel - -   Phalen's - + Pronator Stress - -   Cubital Comp. - - Guyon Comp. - -   Cubital Tinel - - Guyon Tinel - -   Elbow Hyperflexion - - Adson's - -   Spurling's - - SC Comp. - -   PCB Median abn - - SC Tinel - -   Radial Tinel - - IC Comp. - -   Digital Tinel - - IC Tinel - -   Radial 2-Pt WNL WNL Ulnar 2-Pt WNL WNL     Radial Pulse: 2+  Capillary Refill: < 2 sec  Allen: Not Performed  Digital Allen: Not Performed      Imaging: none indicated    02/2018 repeat EMG  NCS/EMG FINDINGS:  ?? Evaluation of the Left median motor, the Right median motor, the Left peroneal motor, the Right peroneal motor, the Left tibial motor, the Right tibial motor, the Left ulnar motor, the Right ulnar motor, the Left superficial peroneal sensory, the Right superficial peroneal sensory, the Left sural sensory, and the Right sural sensory nerves were unremarkable.  ?? The Left Median 2nd Digit sensory nerve showed reduced amplitude (12.5 ??V).  ?? The Right Median 2nd Digit sensory nerve showed prolonged distal peak latency (3.6 ms) and reduced amplitude (12.4 ??V).  ?? The Left ulnar sensory and the Right ulnar sensory nerves showed reduced amplitude (L8.6, R6.3 ??V) and decreased conduction velocity (Wrist-5th Digit, L45.8, R47.8 m/s).  ??  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   3-Normal EMG of RLE muscles.       12/2017  NCV & EMG Findings:  All nerve conduction studies (as indicated in the following tables) were within normal limits.    ??  All examined muscles (as indicated in the following table) showed no  evidence of electrical instability.    ??  INTERPRETATION  This was a normal nerve conduction and EMG study showing there to be no signs of neuropathy, myopathy, or radiculopathy in the nerves and muscles tested.   ??  CLINICAL INTERPRETATION  There are no electrodiagnostic findings correlating with his symptoms.       ICD-10-CM ICD-9-CM    1. Right carpal tunnel syndrome  G56.01 354.0 TRIAMCINOLONE ACETONIDE INJ      triamcinolone acetonide (KENALOG) 10 mg/mL injection      INJECT CARPAL TUNNEL       Plan:     At this point we will delay surgery at this time as he says he is not ready.  We will try another carpal tunnel injection.    Follow-up and Dispositions    ??  Return if symptoms worsen or fail to improve.         Plan was reviewed with patient, who verbalized agreement and understanding of the plan    [facility]  OFFICE PROCEDURE PROGRESS NOTE        Chart reviewed for the following:   I, Scharlene Catalina A Deneane Stifter, DO, have reviewed the History, Physical and updated the Allergic reactions [facility]     TIME OUT performed immediately prior to start of procedure:   I, Dorea Duff A Savonna Birchmeier, DO, have performed the following [facility] prior to the start of the procedure:            * Patient was identified by name and date of birth   * Agreement on procedure being performed was verified  * Risks and Benefits explained to the patient  * Procedure site verified and marked as necessary  * Patient was positioned for comfort  * Consent was signed and verified     Time: 10 AM      Date of procedure: 08/29/2018    Procedure performed by:  Peggyann ShoalsShawn A Mazen Marcin, DO    Provider assisted by: Thana FarrJessica Castillo LPN    Patient assisted by: self    How tolerated by patient: tolerated the procedure well with no complications    Post Procedural Pain Scale: 0 - No Hurt    Comments: none    Procedure:   After consent was obtained, using sterile technique the carpal tunnel was prepped. Local anesthetic used: 1% lidocaine. Kenalog 5 mg and was then injected and the needle withdrawn.  The procedure was well tolerated.  The patient is asked to continue to rest the area for a few more days before resuming regular activities.  It may be more painful for the first 1-2 days.  Watch for fever, or increased swelling or persistent pain in the joint. Call or return to clinic prn if such symptoms occur or there is failure to improve as anticipated.

## 2018-08-31 ENCOUNTER — Inpatient Hospital Stay: Payer: MEDICAID | Primary: Family Medicine

## 2018-08-31 DIAGNOSIS — M5416 Radiculopathy, lumbar region: Secondary | ICD-10-CM

## 2018-09-05 ENCOUNTER — Inpatient Hospital Stay: Admit: 2018-09-05 | Payer: MEDICAID | Primary: Family Medicine

## 2018-09-05 NOTE — Progress Notes (Signed)
 PT DAILY TREATMENT NOTE 8-14    Patient Name: Jim Shaw  Date:09/05/2018  DOB: Nov 18, 1970  [x]   Patient DOB Verified  Payor: [employer] / [place] [employer] / Product Type: Managed Care Medicaid /    In time: 830am      Out time: 920  Total Treatment Time (min):  50  Visit #: 1 of 8    Treatment Area: Radiculopathy, lumbar region [M54.16]  Spinal stenosis, cervical region [M48.02]  Pain in right knee [M25.561]  Other chronic pain [G89.29]  Pain in left hip [M25.552]    SUBJECTIVE  Pain Level (0-10 scale): 8/10 LS   Any medication changes, allergies to medications, adverse drug reactions, diagnosis change, or new procedure performed?: [x]  No    []  Yes (see summary sheet for update)  Subjective functional status/changes:   []  No changes reported  Pt reports co of feeling woozie but reports he is ok to participate in PT     OBJECTIVE  Modality rationale: decrease pain and increase tissue extensibility to improve the patient's ability to tolerate ambulation, transfers, and ADL's   Min Type Additional Details   10 []  Estim: [] Att   [x] Unatt  [] TENS instruct                 [x] IFC  [] Premod [] NMES                       [] Other:  [] w/US    [] w/ice   [x] w/heat  Position: semi-reclined  Location: lumbar spine    []   Traction: []  Cervical       [] Lumbar                       []  Prone          [] Supine                       [] Intermittent   [] Continuous Lbs:  []  before manual  []  after manual    []   Ultrasound: [] Continuous   []  Pulsed                           []   [] Location:  W/cm2:    []   Iontophoresis with dexamethasone         Location: []  Take home patch   []  In clinic    []   Ice     []   heat  []   Ice massage Position:  Location:    []   Vasopneumatic Device Pressure: []  lo []  med []  hi   Temp: []  lo []  med []  hi   [x]  Skin assessment post-treatment:  [x] intact [] redness- no adverse reaction       [] redness - adverse reaction:     12 min Therapeutic Exercise:  [x]  See flow sheet:  Assess while on Nustep    Rationale: increase ROM and increase strength to improve the patient's ability to walk, transfer, stand    20 min Therapeutic Activity:  [x]   See flow sheet:    Rationale:  improve coordination, improve balance and increase proprioception  to improve the patient's ability to perform transfers       8 min Neuromuscular Re-education:  [x]   See flow sheet:   Rationale: improve coordination, improve balance and increase proprioception  to improve the patient's ability to safely ambulate without AD and reduce fall risk  NT  min Gait Training:  Held today sec to fatigue    Rationale:  increase proprioception, improve balance strategies, increase strength to improve the patient's ability to ambulate with reduced fall risk          X min Patient Education: [x]  Review HEP      Other Objective/Functional Measures:     Squat progression : butt taps on high table (positioned at mid thigh)  Sit to stand: no UE, wide BOS     Balance fair to poor.     Pain Level (0-10 scale) post treatment: 7    ASSESSMENT/Changes in Function:   Patient movements are slow and labored requiring increased time to perform requiring verbal encouragement throughout treatment.  Patient would appear to be asleep at times but reported he was fine. Set a timer between sets for 1 min for proper rest. Noted m fasciculations with LE therex indicating poor LE strength. Held stnading therex sec to poor stability noted in first half of treatment.  COnt ES for pain relief to increase function     Patient will continue to benefit from skilled PT services to modify and progress therapeutic interventions, address functional mobility deficits, address ROM deficits, address strength deficits, analyze and address soft tissue restrictions, analyze and cue movement patterns, analyze and modify body mechanics/ergonomics, assess and modify postural abnormalities, address imbalance/dizziness and instruct in home and community integration to  attain remaining goals.     []   See Plan of Care  [x]   See progress note/recertification 08/24/2018   []   See Discharge Summary         Progress towards goals / Updated goals:  New Goals to be achieved in __8-10__  treatments:  1.  Patient will improve FOTO assessment score to46pts in order to indicate improved functional abilities.   2.  Patient willdemonstrate ability to squat to 90 deg hip flexion or better for proper lifting goal progressing - patient able to perform butt taps to 30 deg without UE support 09/05/2018   3.  Patient will report overall at least 65% improvement in function in order to progress toward premorbid status.   4. Pt will demo SLS on R LE for >3 seconds to show improved stance phase stability for safe ambulation without AD    5.  Pt will demo ability to ambulate without AD 150 ft for progression of safe ambulation in community       PLAN  [x]   Upgrade activities as tolerated     [x]   Continue plan of care  []   Update interventions per flow sheet       []   Discharge due to:_  []   Other:_  Cont 2x/wk for 4 weeks.     Ami Rea GEANNIE Leodis, PT 09/05/2018   Future Appointments   Date Time Provider Department Center   09/07/2018  8:00 AM [facility] HERO, PT [facility]   09/11/2018  8:30 AM Griselda Dorn HERO, PTA [facility]   09/13/2018 10:45 AM [facility]   09/18/2018 10:00 AM [facility]   09/21/2018  8:00 AM Griselda Dorn HERO, PTA [facility]   11/21/2018  9:30 AM Leonce Zebedee MATSU, MD [facility]

## 2018-09-05 NOTE — Progress Notes (Signed)
PT DAILY TREATMENT NOTE 8-14    Patient Name: Jim Shaw  Date:09/05/2018  DOB: 1971-01-22  [x]   Patient DOB Verified  Payor: [employer] / [place] [employer] / Product Type: Managed Care Medicaid /    In time: 830am      Out time: 920  Total Treatment Time (min):  50  Visit #: 1 of 8    Treatment Area: Radiculopathy, lumbar region [M54.16]  Spinal stenosis, cervical region [M48.02]  Pain in right knee [M25.561]  Other chronic pain [G89.29]  Pain in left hip [M25.552]    SUBJECTIVE  Pain Level (0-10 scale): 8/10 LS   Any medication changes, allergies to medications, adverse drug reactions, diagnosis change, or new procedure performed?: [x]  No    []  Yes (see summary sheet for update)  Subjective functional status/changes:   []  No changes reported  Pt reports co of feeling "woozie" but reports he is ok to participate in PT     OBJECTIVE  Modality rationale: decrease pain and increase tissue extensibility to improve the patient???s ability to tolerate ambulation, transfers, and ADL's   Min Type Additional Details   10 []  Estim: [] Att   [x] Unatt  [] TENS instruct                 [x] IFC  [] Premod [] NMES                       [] Other:  [] w/US   [] w/ice   [x] w/heat  Position: semi-reclined  Location: lumbar spine    []   Traction: []  Cervical       [] Lumbar                       []  Prone          [] Supine                       [] Intermittent   [] Continuous Lbs:  []  before manual  []  after manual    []   Ultrasound: [] Continuous   []  Pulsed                           [] 1MHz   [] 3MHz Location:  W/cm2:    []   Iontophoresis with dexamethasone         Location: []  Take home patch   []  In clinic    []   Ice     []   heat  []   Ice massage Position:  Location:    []   Vasopneumatic Device Pressure: []  lo []  med []  hi   Temp: []  lo []  med []  hi   [x]  Skin assessment post-treatment:  [x] intact [] redness- no adverse reaction       [] redness ??? adverse reaction:      12 min Therapeutic Exercise:  [x]  See flow sheet: Assess while on Nustep    Rationale: increase ROM and increase strength to improve the patient???s ability to walk, transfer, stand    20 min Therapeutic Activity:  [x]   See flow sheet:    Rationale:  improve coordination, improve balance and increase proprioception  to improve the patient???s ability to perform transfers       8 min Neuromuscular Re-education:  [x]   See flow sheet:   Rationale: improve coordination, improve balance and increase proprioception  to improve the patient???s ability to safely ambulate without AD and reduce fall risk  NT  min Gait Training:  Held today sec to fatigue    Rationale:  increase proprioception, improve balance strategies, increase strength to improve the patient's ability to ambulate with reduced fall risk          X min Patient Education: [x]  Review HEP      Other Objective/Functional Measures:     Squat progression : butt taps on high table (positioned at mid thigh)  Sit to stand: no UE, wide BOS     Balance fair to poor.     Pain Level (0-10 scale) post treatment: 7    ASSESSMENT/Changes in Function:   Patient movements are slow and labored requiring increased time to perform requiring verbal encouragement throughout treatment.  Patient would appear to be asleep at times but reported he was fine. Set a timer between sets for 1 min for proper rest. Noted m fasciculations with LE therex indicating poor LE strength. Held stnading therex sec to poor stability noted in first half of treatment.  COnt ES for pain relief to increase function     Patient will continue to benefit from skilled PT services to modify and progress therapeutic interventions, address functional mobility deficits, address ROM deficits, address strength deficits, analyze and address soft tissue restrictions, analyze and cue movement patterns, analyze and modify body mechanics/ergonomics, assess and modify postural abnormalities,  address imbalance/dizziness and instruct in home and community integration to attain remaining goals.     []   See Plan of Care  [x]   See progress note/recertification 01/25/7508   []   See Discharge Summary         Progress towards goals / Updated goals:  New Goals to be achieved in __8-10__  treatments:  1.  Patient will improve FOTO assessment score to??46??pts in order to indicate improved functional abilities.   2.  Patient will??demonstrate ability to squat to 90 deg hip flexion or better for proper lifting goal progressing - patient able to perform butt taps to 30 deg without UE support 09/05/2018   3.  Patient will report overall at least 65% improvement in function in order to progress toward premorbid status.   4. Pt will demo SLS on R LE for >3 seconds to show improved stance phase stability for safe ambulation without AD    5.  Pt will demo ability to ambulate without AD 150 ft for progression of safe ambulation in community       PLAN  [x]   Upgrade activities as tolerated     [x]   Continue plan of care  []   Update interventions per flow sheet       []   Discharge due to:_  []   Other:_  Cont 2x/wk for 4 weeks.     Rolene Course, PT 09/05/2018   Future Appointments   Date Time Provider [place]   09/07/2018  8:00 AM Johnnye Lana, PT [facility]   09/11/2018  8:30 AM Creta Levin, PTA [facility]   09/13/2018 10:45 AM [place] PT [facility] 2 [facility] [place]   09/18/2018 10:00 AM [place] PT [facility] 2 [facility] [place]   09/21/2018  8:00 AM Creta Levin, PTA [facility]   11/21/2018  9:30 AM Kern Reap, MD [place]

## 2018-09-07 ENCOUNTER — Inpatient Hospital Stay: Admit: 2018-09-07 | Payer: MEDICAID | Primary: Family Medicine

## 2018-09-07 NOTE — Progress Notes (Signed)
 PT DAILY TREATMENT NOTE 8-14    Patient Name: Jim Shaw  Date:09/07/2018  DOB: [birth year]  [x]   Patient DOB Verified  Payor: [employer] / [place] / Product Type: Managed Care Medicaid /    In time:8:00  Out time:8:54  Total Treatment Time (min): 54  Total Timed Codes (min): 44  1:1 Treatment Time (min): 44   Visit #: 2 of 8    Treatment Area: Radiculopathy, lumbar region [M54.16]  Spinal stenosis, cervical region [M48.02]  Pain in right knee [M25.561]  Other chronic pain [G89.29]  Pain in left hip [M25.552]    SUBJECTIVE  Pain Level (0-10 scale): 8  Any medication changes, allergies to medications, adverse drug reactions, diagnosis change, or new procedure performed?: [x]  No    []  Yes (see summary sheet for update)  Subjective functional status/changes:   []  No changes reported  Just hurting  Better: sometimes standing, mostly in a recliner.   Pt notes that seated slumped increases pain     OBJECTIVE  Modality rationale: decrease pain and increase tissue extensibility to improve the patient's ability to ambulate, t/f, ADLs    Min Type Additional Details   10 []  Estim: [] Att   [] Unatt        [] TENS instruct                  [x] IFC  [] Premod   [] NMES                     [] Other:  [] w/US    [] w/ice   [x] w/heat  Position:semireclined  Location: lumbar spine    [x]  Skin assessment post-treatment:  [x] intact [] redness- no adverse reaction       [] redness - adverse reaction:       12 min Therapeutic Exercise:  [x]  See flow sheet : progressed marching and LAQ   Rationale: increase ROM and increase strength to improve the patient's ability to ambulate, stand, balance     12 min Therapeutic Activity:  [x]   See flow sheet : initiate bridges in semi-reclined    Rationale:  To improve sitting, squatting, t/f movements      12 min Neuromuscular Re-education:  [x]   See flow sheet : MSR, Romberg, toe taps, SB roll outs    Rationale: improve coordination, improve balance and increase  proprioception  to improve the patient's ability to ambulate safely     8 min Gait Training:  Retro gait in bars, side stepping   Rationale:          x min Patient Education: [x]  Review HEP    []  Progressed/Changed HEP based on:   []  positioning   []  body mechanics   []  transfers   []  heat/ice application        Other Objective/Functional Measures:     Difficulty with R MSR at bunion, with multiple LOB.    Demo's very slow and exaggerated R LE hip flexion for step taps.  Pt can tolerate approx 1 of SLS on the R LE during side stepping after VC for stepping, not shuffling, gait and steady lift of the L LE while in SLS on the R.   Pt demo's R LE spasms with both concentric and eccentric butt taps/squats after 3 sets of 5 reps with 1-2 min rest between sets.   Pt demo's clonus-like movement of the LE while doing seated Heel slides, but has (-) for clonus when tested at the ankle by a PT.  R knee AROM (sitting): 98, supine: 100 (AAROM)   Pt demo's a 10-25% bridge in semi-reclined     Pain Level (0-10 scale) post treatment: 8    ASSESSMENT/Changes in Function: Pt making slow progress towards gains with con't pain levels of 8/10, exaggerated difficulty with all movements of the R LE into hip flexion, knee flexion and balance activities.  Pt demo's <2/5 MMT of all mm of the R LE today. Con't to demo significant difficult with all t/f, bed mobility, squats (although pt demo's ease to stand to sit when asked to sit on edge of bed for LAQ's).  When walking to car at end of session pt demo's normalized speed of ambulation and minimally antalgic on the R. Spinal flexion demo'ed to reach down and unlock car door and pt able to squat into car seat with improved speed vs squatting movements in clinic.       Patient will continue to benefit from skilled PT services to modify and progress therapeutic interventions, address functional mobility deficits, address ROM deficits, address strength deficits, analyze and address soft  tissue restrictions, analyze and cue movement patterns, analyze and modify body mechanics/ergonomics, assess and modify postural abnormalities, address imbalance/dizziness and instruct in home and community integration to attain remaining goals.     []   See Plan of Care  []   See progress note/recertification  []   See Discharge Summary         Progress towards goals / Updated goals:  1. Patient will improve FOTO assessment score to46pts in order to indicate improved functional abilities.   2. Patient willdemonstrate ability to squat to 90 deg hip flexion or better for proper lifting goal progressing - patient able to perform butt taps to 30 deg without UE support 09/05/2018   3. Patient will report overall at least 65% improvement in function in order to progress toward premorbid status.   4. Pt will demo SLS on R LE for >3 seconds to show improved stance phase stability for safe ambulation without AD Very challenged by MSR today at bunion with multiple LOB with R and with L back (09/07/2018) ; see note above on side stepping and 1 of SLS (09/07/2018)    5. Pt will demo ability to ambulate without AD 150 ft for progression of safe ambulation in community  Pt notes ability to ambulate without it for 10-15 minutes (09/07/2018)      PLAN  [x]   Upgrade activities as tolerated     [x]   Continue plan of care  []   Update interventions per flow sheet       []   Discharge due to:_  []   Other:_      [place] CHRISTELLA Shin, PT 09/07/2018   8:00am     Future Appointments   Date Time Provider Department Center   09/07/2018  8:00 AM [facility] CHRISTELLA, PT [facility]   09/11/2018  8:30 AM Griselda Dorn CHRISTELLA, PTA [facility]   09/13/2018 10:45 AM [facility]   09/18/2018 10:00 AM [facility]   09/21/2018  8:00 AM Griselda Dorn CHRISTELLA, PTA [facility]   11/21/2018  9:30 AM Leonce Zebedee MATSU, MD [facility]

## 2018-09-07 NOTE — Progress Notes (Addendum)
PT DAILY TREATMENT NOTE 8-14    Patient Name: Jim Shaw  Date:09/07/2018  DOB: [birth year]  [x]   Patient DOB Verified  Payor: [employer] MEDICAID / Plan: [place] / Product Type: Managed Care Medicaid /    In time:8:00  Out time:8:54  Total Treatment Time (min): 54  Total Timed Codes (min): 44  1:1 Treatment Time (min): 44   Visit #: 2 of 8    Treatment Area: Radiculopathy, lumbar region [M54.16]  Spinal stenosis, cervical region [M48.02]  Pain in right knee [M25.561]  Other chronic pain [G89.29]  Pain in left hip [M25.552]    SUBJECTIVE  Pain Level (0-10 scale): 8  Any medication changes, allergies to medications, adverse drug reactions, diagnosis change, or new procedure performed?: [x]  No    []  Yes (see summary sheet for update)  Subjective functional status/changes:   []  No changes reported  "Just hurting"  Better: sometimes standing, mostly in a recliner.   Pt notes that seated slumped increases pain     OBJECTIVE  Modality rationale: decrease pain and increase tissue extensibility to improve the patient???s ability to ambulate, t/f, ADLs    Min Type Additional Details   10 []  Estim: [] Att   [] Unatt        [] TENS instruct                  [x] IFC  [] Premod   [] NMES                     [] Other:  [] w/US   [] w/ice   [x] w/heat  Position:semireclined  Location: lumbar spine    [x]  Skin assessment post-treatment:  [x] intact [] redness- no adverse reaction       [] redness ??? adverse reaction:       12 min Therapeutic Exercise:  [x]  See flow sheet : progressed marching and LAQ   Rationale: increase ROM and increase strength to improve the patient???s ability to ambulate, stand, balance     12 min Therapeutic Activity:  [x]   See flow sheet : initiate bridges in semi-reclined    Rationale:  To improve sitting, squatting, t/f movements      12 min Neuromuscular Re-education:  [x]   See flow sheet : MSR, Romberg, toe taps, SB roll outs    Rationale: improve coordination, improve balance and increase  proprioception  to improve the patient???s ability to ambulate safely     8 min Gait Training:  Retro gait in bars, side stepping   Rationale:          x min Patient Education: [x]  Review HEP    []  Progressed/Changed HEP based on:   []  positioning   []  body mechanics   []  transfers   []  heat/ice application        Other Objective/Functional Measures:     Difficulty with R MSR at bunion, with multiple LOB.    Demo's very slow and exaggerated R LE hip flexion for step taps.  Pt can tolerate approx 1" of SLS on the R LE during side stepping after VC for stepping, not shuffling, gait and steady lift of the L LE while in SLS on the R.   Pt demo's R LE spasms with both concentric and eccentric butt taps/squats after 3 sets of 5 reps with 1-2 min rest between sets.   Pt demo's clonus-like movement of the LE while doing seated Heel slides, but has (-) for clonus when tested at the ankle by a PT.  R knee AROM (sitting): 98, supine: 100 (AAROM)   Pt demo's a 10-25% bridge in semi-reclined     Pain Level (0-10 scale) post treatment: 8    ASSESSMENT/Changes in Function: Pt making slow progress towards gains with con't pain levels of 8/10, exaggerated difficulty with all movements of the R LE into hip flexion, knee flexion and balance activities.  Pt demo's <2/5 MMT of all mm of the R LE today. Con't to demo significant difficult with all t/f, bed mobility, squats (although pt demo's ease to stand to sit when asked to sit on edge of bed for LAQ's).  When walking to car at end of session pt demo's normalized speed of ambulation and minimally antalgic on the R. Spinal flexion demo'ed to reach down and unlock car door and pt able to squat into car seat with improved speed vs squatting movements in clinic.       Patient will continue to benefit from skilled PT services to modify and progress therapeutic interventions, address functional mobility deficits, address ROM deficits, address strength deficits, analyze and address soft  tissue restrictions, analyze and cue movement patterns, analyze and modify body mechanics/ergonomics, assess and modify postural abnormalities, address imbalance/dizziness and instruct in home and community integration to attain remaining goals.     []  See Plan of Care  []  See progress note/recertification  []  See Discharge Summary         Progress towards goals / Updated goals:  1.?? Patient will improve FOTO assessment score to??46??pts in order to indicate improved functional abilities.   2.?? Patient will??demonstrate ability to squat to 90 deg hip flexion or better for proper lifting goal progressing - patient able to perform butt taps to 30 deg without UE support 09/05/2018   3.?? Patient will report overall at least 65% improvement in function in order to progress toward premorbid status.   4. Pt will demo SLS on R LE for >3 seconds to show improved stance phase stability for safe ambulation without AD Very challenged by MSR today at bunion with multiple LOB with R and with L back (09/07/2018) ; see note above on side stepping and 1" of SLS (09/07/2018)    5.?? Pt will demo ability to ambulate without AD 150 ft for progression of safe ambulation in community  Pt notes ability to ambulate without it for 10-15 minutes (09/07/2018)    ??  PLAN  [x]  Upgrade activities as tolerated     [x]  Continue plan of care  []  Update interventions per flow sheet       []  Discharge due to:_  []  Other:_      Archie Atilano M Randye Treichler, PT 09/07/2018   8:00am     Future Appointments   Date Time Provider Department Center   09/07/2018  8:00 AM Isaia Hassell M, PT [facility]   09/11/2018  8:30 AM Quiazon, Jonathan M, PTA [facility]   09/13/2018 10:45 AM [facility]   09/18/2018 10:00 AM [facility]   09/21/2018  8:00 AM Quiazon, Jonathan M, PTA [facility]   11/21/2018  9:30 AM Jackson, Theresa G, MD [facility]

## 2018-09-11 ENCOUNTER — Inpatient Hospital Stay: Admit: 2018-09-11 | Payer: MEDICAID | Primary: Family Medicine

## 2018-09-11 NOTE — Progress Notes (Signed)
PT DAILY TREATMENT NOTE 8-14    Patient Name: Jim Shaw  Date:09/07/2018  DOB: 1971/01/25  [x]   Patient DOB Verified  Payor: [employer] / [place] [employer] / Product Type: Managed Care Medicaid /    In time: 8:37 am            Out time: 9:40 am  Total Treatment Time (min): 63  Visit #: 3 of 8    Treatment Area: Radiculopathy, lumbar region [M54.16]  Spinal stenosis, cervical region [M48.02]  Pain in right knee [M25.561]  Other chronic pain [G89.29]  Pain in left hip [M25.552]    SUBJECTIVE  Pain Level (0-10 scale): 8  Any medication changes, allergies to medications, adverse drug reactions, diagnosis change, or new procedure performed?: [x]  No    []  Yes (see summary sheet for update)  Subjective functional status/changes:   []  No changes reported  Patient reports no significant changes since last session.    OBJECTIVE  Modality rationale: decrease pain and increase tissue extensibility to improve the patient's ability to ambulate, t/f, ADLs    Min Type Additional Details   10 []  Estim: [] Att   [] Unatt        [] TENS instruct                  [x] IFC  [] Premod   [] NMES                     [] Other:  [] w/US   [] w/ice   [x] w/heat  Position:semi-reclined  Location: lumbar spine    [x]  Skin assessment post-treatment:  [x] intact [] redness- no adverse reaction       [] redness - adverse reaction:       17 min Therapeutic Exercise:  [x]  See flow sheet:    Rationale: increase ROM and increase strength to improve the patient's ability to ambulate, stand, balance     16 min Therapeutic Activity:  [x]   See flow sheet: initiated hurdle retraining for curb negotiation   Rationale:  to improve sitting, squatting, t/f movements      12 min Neuromuscular Re-education:  [x]   See flow sheet: MSR, Romberg, toe taps, SB roll outs    Rationale: improve coordination, improve balance and increase proprioception  to improve the patient's ability to ambulate safely     8 min Gait Training:  retro gait in bars, side  stepping   Rationale: increase proprioception, improve balance strategies, increase strength to improve the patient's ability to ambulate with reduced fall risk          X min Patient Education: [x]  Review HEP     Other Objective/Functional Measures:  MSR bunion EO: (L) 30", (R) 8"    Pain Level (0-10 scale) post treatment: 8    ASSESSMENT/Changes in Function:   Patient with improved static balance; demos (I), but slow cadence with gait, although with decreased endurance limited to approx 60 feet increments. Noted patient with improved and normalized cadence with gait out in parking lot post-treatment.    Patient will continue to benefit from skilled PT services to modify and progress therapeutic interventions, address functional mobility deficits, address ROM deficits, address strength deficits, analyze and address soft tissue restrictions, analyze and cue movement patterns, analyze and modify body mechanics/ergonomics, assess and modify postural abnormalities, address imbalance/dizziness and instruct in home and community integration to attain remaining goals.     []   See Plan of Care  []   See progress note/recertification  []   See  Discharge Summary         Progress towards goals / Updated goals:  1. Patient will improve FOTO assessment score to46pts in order to indicate improved functional abilities.   2. Patient willdemonstrate ability to squat to 90 deg hip flexion or better for proper lifting goal progressing - patient able to perform butt taps to 30 deg without UE support 09/05/2018   3. Patient will report overall at least 65% improvement in function in order to progress toward premorbid status.   4. Pt will demo SLS on R LE for >3 seconds to show improved stance phase stability for safe ambulation without AD Very challenged by MSR today at bunion with multiple LOB with R and with L back (09/07/2018) ; see note above on side stepping and 1" of SLS (09/07/2018)    5. Pt will demo ability to ambulate  without AD 150 ft for progression of safe ambulation in community  Pt notes ability to ambulate without it for 10-15 minutes (09/07/2018)      PLAN  [x]   Upgrade activities as tolerated     [x]   Continue plan of care  []   Update interventions per flow sheet       []   Discharge due to:_  []   Other:_      Carin Primrose, PTA 09/11/18    Future Appointments   Date Time Provider Department Center   09/13/2018 10:45 AM [facility] PT [facility] 2 [facility]   09/18/2018 10:00 AM [facility] PT [facility]   09/21/2018  8:00 AM Carin Primrose, PTA [facility]   11/21/2018  9:30 AM Tobie Lords, MD [facility]

## 2018-09-11 NOTE — Progress Notes (Signed)
PT DAILY TREATMENT NOTE 8-14    Patient Name: Jim Shaw  Date:09/07/2018  DOB: 1970/05/01  [x]   Patient DOB Verified  Payor: [employer] / [place] [employer] / Product Type: Managed Care Medicaid /    In time: 8:37 am            Out time: 9:40 am  Total Treatment Time (min): 63  Visit #: 3 of 8    Treatment Area: Radiculopathy, lumbar region [M54.16]  Spinal stenosis, cervical region [M48.02]  Pain in right knee [M25.561]  Other chronic pain [G89.29]  Pain in left hip [M25.552]    SUBJECTIVE  Pain Level (0-10 scale): 8  Any medication changes, allergies to medications, adverse drug reactions, diagnosis change, or new procedure performed?: [x]  No    []  Yes (see summary sheet for update)  Subjective functional status/changes:   []  No changes reported  Patient reports no significant changes since last session.    OBJECTIVE  Modality rationale: decrease pain and increase tissue extensibility to improve the patient???s ability to ambulate, t/f, ADLs    Min Type Additional Details   10 []  Estim: [] Att   [] Unatt        [] TENS instruct                  [x] IFC  [] Premod   [] NMES                     [] Other:  [] w/US   [] w/ice   [x] w/heat  Position:semi-reclined  Location: lumbar spine    [x]  Skin assessment post-treatment:  [x] intact [] redness- no adverse reaction       [] redness ??? adverse reaction:       17 min Therapeutic Exercise:  [x]  See flow sheet:    Rationale: increase ROM and increase strength to improve the patient???s ability to ambulate, stand, balance     16 min Therapeutic Activity:  [x]   See flow sheet: initiated hurdle retraining for curb negotiation   Rationale:  to improve sitting, squatting, t/f movements      12 min Neuromuscular Re-education:  [x]   See flow sheet: MSR, Romberg, toe taps, SB roll outs    Rationale: improve coordination, improve balance and increase proprioception  to improve the patient???s ability to ambulate safely      8 min Gait Training:  retro gait in bars, side stepping   Rationale: increase proprioception, improve balance strategies, increase strength to improve the patient's ability to ambulate with reduced fall risk          X min Patient Education: [x]  Review HEP     Other Objective/Functional Measures:  MSR bunion EO: (L) 30", (R) 8"    Pain Level (0-10 scale) post treatment: 8    ASSESSMENT/Changes in Function:   Patient with improved static balance; demos (I), but slow cadence with gait, although with decreased endurance limited to approx 60 feet increments. Noted patient with improved and normalized cadence with gait out in parking lot post-treatment.    Patient will continue to benefit from skilled PT services to modify and progress therapeutic interventions, address functional mobility deficits, address ROM deficits, address strength deficits, analyze and address soft tissue restrictions, analyze and cue movement patterns, analyze and modify body mechanics/ergonomics, assess and modify postural abnormalities, address imbalance/dizziness and instruct in home and community integration to attain remaining goals.     []   See Plan of Care  []   See progress note/recertification  []   See  Discharge Summary         Progress towards goals / Updated goals:  1.?? Patient will improve FOTO assessment score to??46??pts in order to indicate improved functional abilities.   2.?? Patient will??demonstrate ability to squat to 90 deg hip flexion or better for proper lifting goal progressing - patient able to perform butt taps to 30 deg without UE support 09/05/2018   3.?? Patient will report overall at least 65% improvement in function in order to progress toward premorbid status.   4. Pt will demo SLS on R LE for >3 seconds to show improved stance phase stability for safe ambulation without AD Very challenged by MSR today at bunion with multiple LOB with R and with L back (09/07/2018) ; see note  above on side stepping and 1" of SLS (09/07/2018)    5.?? Pt will demo ability to ambulate without AD 150 ft for progression of safe ambulation in community  Pt notes ability to ambulate without it for 10-15 minutes (09/07/2018)    ??  PLAN  [x]   Upgrade activities as tolerated     [x]   Continue plan of care  []   Update interventions per flow sheet       []   Discharge due to:_  []   Other:_      Creta Levin, PTA 09/11/18    Future Appointments   Date Time Provider [place]   09/13/2018 10:45 AM [facility] PT [facility] 2 [facility]   09/18/2018 10:00 AM [place] PT [facility] 2 [facility] [place]   09/21/2018  8:00 AM Creta Levin, PTA [facility]   11/21/2018  9:30 AM Kern Reap, MD [place]

## 2018-09-13 ENCOUNTER — Inpatient Hospital Stay: Admit: 2018-09-13 | Payer: MEDICAID | Primary: Family Medicine

## 2018-09-13 NOTE — Progress Notes (Signed)
PT DAILY TREATMENT NOTE 8-14    Patient Name: Jim HickmanChristopher Dewayne Shaw  Date:09/07/2018  DOB: 03-Apr-1970  [x]   Patient DOB Verified  Payor: [employer] / [place] [employer] / Product Type: Managed Care Medicaid /    In time: 1045           Out time: 11:45  Total Treatment Time (min): 60  Visit #: 4 of 8    Treatment Area: Radiculopathy, lumbar region [M54.16]  Spinal stenosis, cervical region [M48.02]  Pain in right knee [M25.561]  Other chronic pain [G89.29]  Pain in left hip [M25.552]    SUBJECTIVE  Pain Level (0-10 scale): 8.5-9/10   Any medication changes, allergies to medications, adverse drug reactions, diagnosis change, or new procedure performed?: [x]  No    []  Yes (see summary sheet for update)  Subjective functional status/changes:   []  No changes reported  Patient brought new Breg knee brace to session, stating "I'm not sure how to put this on".   "I messed up" - Reports increased pain today sec to walking long distance to the grocery store yesterday without using SPC.     OBJECTIVE  Modality rationale: decrease pain and increase tissue extensibility to improve the patient???s ability to ambulate, t/f, ADLs    Min Type Additional Details   10 []  Estim: [] Att   [] Unatt        [] TENS instruct                  [x] IFC  [] Premod   [] NMES                     [] Other:  [] w/US   [] w/ice   [x] w/heat  Position:semi-reclined  Location: lumbar spine    [x]  Skin assessment post-treatment:  [x] intact [] redness- no adverse reaction       [] redness ??? adverse reaction:       16 min Therapeutic Exercise:  [x]  See flow sheet:    Rationale: increase ROM and increase strength to improve the patient???s ability to ambulate, stand, balance     16 min Therapeutic Activity:  [x]   See flow sheet: initiated hurdle retraining for curb negotiation   Rationale:  to improve sitting, squatting, t/f movements      12 min Neuromuscular Re-education:  [x]   See flow sheet: MSR, Romberg, toe taps, SB roll outs    Rationale: improve  coordination, improve balance and increase proprioception  to improve the patient???s ability to ambulate safely     6 min Gait Training: side stepping, ambulation around clinic w/o AD   Rationale: increase proprioception, improve balance strategies, increase strength to improve the patient's ability to ambulate with reduced fall risk          X min Patient Education: [x]  Review HEP     Other Objective/Functional Measures:  Unable to achieve full LAQ on R Jim Shaw in clinic, but able to step up onto curb without instability (outside of clinic)  Minimal knee flexion AROM to 90 deg during seated heel slides, but able to flex knee past 90 degrees when donning/doffing brace  Demo single moment of sudden knee "buckling" with arms raising in the air (instead of utilizing protective extension to grab onto //bar) and still able to prevent fall.     Gait: reduced R stance time, limited R hip and knee flexion during swing, lack of heel strike     Pain Level (0-10 scale) post treatment: 8    ASSESSMENT/Changes in Function:  Instructed patient in appropriate donning of new knee brace. Pt continues to demonstrate self limiting behaviors throughout session with slow and labored movements requiring additional time. There are marked discrepancies between patients claimed impairments and objective findings (see above). Lack of appreciable progress being made towards goals at this time, therefore will likely DC at the end of the month.       Patient will continue to benefit from skilled PT services to modify and progress therapeutic interventions, address functional mobility deficits, address ROM deficits, address strength deficits, analyze and address soft tissue restrictions, analyze and cue movement patterns, analyze and modify body mechanics/ergonomics, assess and modify postural abnormalities, address imbalance/dizziness and instruct in home and community integration to attain remaining goals.     []   See Plan of Care  []   See progress  note/recertification  []   See Discharge Summary         Progress towards goals / Updated goals:  1.?? Patient will improve FOTO assessment score to??46??pts in order to indicate improved functional abilities.   2.?? Patient will??demonstrate ability to squat to 90 deg hip flexion or better for proper lifting goal progressing - patient able to perform butt taps to 30 deg without UE support 09/05/2018   3.?? Patient will report overall at least 65% improvement in function in order to progress toward premorbid status.   4. Pt will demo SLS on R Jim Shaw for >3 seconds to show improved stance phase stability for safe ambulation without AD Very challenged by MSR today at bunion with multiple LOB with R and with L back (09/07/2018) ; see note above on side stepping and 1" of SLS (09/07/2018)    5.?? Pt will demo ability to ambulate without AD 150 ft for progression of safe ambulation in community  Pt notes ability to ambulate without it for 10-15 minutes (09/07/2018)    ??  PLAN  [x]   Upgrade activities as tolerated     [x]   Continue plan of care  []   Update interventions per flow sheet       []   Discharge due to:_  []   Other:_ Cont for last 2 scheduled appts, then DC.     Almyra Deforest, PT 09/11/18    Future Appointments   Date Time Provider [place]   09/13/2018 10:45 AM [facility] PT [facility] 2 [facility]   09/18/2018 10:00 AM [place] PT [facility] 2 [facility] [place]   09/21/2018  8:00 AM Creta Levin, [place] [facility]   11/21/2018  9:30 AM Kern Reap, MD [place]

## 2018-09-13 NOTE — Progress Notes (Signed)
PT DAILY TREATMENT NOTE 8-14    Patient Name: Jim Shaw  Date:09/07/2018  DOB: May 02, 1970  [x]   Patient DOB Verified  Payor: [employer] / [place] / Product Type: Managed Care Medicaid /    In time: 1045           Out time: 11:45  Total Treatment Time (min): 60  Visit #: 4 of 8    Treatment Area: Radiculopathy, lumbar region [M54.16]  Spinal stenosis, cervical region [M48.02]  Pain in right knee [M25.561]  Other chronic pain [G89.29]  Pain in left hip [M25.552]    SUBJECTIVE  Pain Level (0-10 scale): 8.5-9/10   Any medication changes, allergies to medications, adverse drug reactions, diagnosis change, or new procedure performed?: [x]  No    []  Yes (see summary sheet for update)  Subjective functional status/changes:   []  No changes reported  Patient brought new Breg knee brace to session, stating "I'm not sure how to put this on".   "I messed up" - Reports increased pain today sec to walking long distance to the grocery store yesterday without using SPC.     OBJECTIVE  Modality rationale: decrease pain and increase tissue extensibility to improve the patient???s ability to ambulate, t/f, ADLs    Min Type Additional Details   10 []  Estim: [] Att   [] Unatt        [] TENS instruct                  [x] IFC  [] Premod   [] NMES                     [] Other:  [] w/US   [] w/ice   [x] w/heat  Position:semi-reclined  Location: lumbar spine    [x]  Skin assessment post-treatment:  [x] intact [] redness- no adverse reaction       [] redness ??? adverse reaction:       16 min Therapeutic Exercise:  [x]  See flow sheet:    Rationale: increase ROM and increase strength to improve the patient???s ability to ambulate, stand, balance     16 min Therapeutic Activity:  [x]   See flow sheet: initiated hurdle retraining for curb negotiation   Rationale:  to improve sitting, squatting, t/f movements      12 min Neuromuscular Re-education:  [x]   See flow sheet: MSR, Romberg, toe taps, SB roll outs     Rationale: improve coordination, improve balance and increase proprioception  to improve the patient???s ability to ambulate safely     6 min Gait Training: side stepping, ambulation around clinic w/o AD   Rationale: increase proprioception, improve balance strategies, increase strength to improve the patient's ability to ambulate with reduced fall risk          X min Patient Education: [x]  Review HEP     Other Objective/Functional Measures:  Unable to achieve full LAQ on R LE in clinic, but able to step up onto curb without instability (outside of clinic)  Minimal knee flexion AROM to 90 deg during seated heel slides, but able to flex knee past 90 degrees when donning/doffing brace  Demo single moment of sudden knee "buckling" with arms raising in the air (instead of utilizing protective extension to grab onto //bar) and still able to prevent fall.     Gait: reduced R stance time, limited R hip and knee flexion during swing, lack of heel strike     Pain Level (0-10 scale) post treatment: 8    ASSESSMENT/Changes in Function:  Instructed patient in appropriate donning of new knee brace. Pt continues to demonstrate self limiting behaviors throughout session with slow and labored movements requiring additional time. There are marked discrepancies between patients claimed impairments and objective findings (see above). Lack of appreciable progress being made towards goals at this time, therefore will likely DC at the end of the month.       Patient will continue to benefit from skilled PT services to modify and progress therapeutic interventions, address functional mobility deficits, address ROM deficits, address strength deficits, analyze and address soft tissue restrictions, analyze and cue movement patterns, analyze and modify body mechanics/ergonomics, assess and modify postural abnormalities, address imbalance/dizziness and instruct in home and community integration to attain remaining goals.      []   See Plan of Care  []   See progress note/recertification  []   See Discharge Summary         Progress towards goals / Updated goals:  1.?? Patient will improve FOTO assessment score to??46??pts in order to indicate improved functional abilities.   2.?? Patient will??demonstrate ability to squat to 90 deg hip flexion or better for proper lifting goal progressing - patient able to perform butt taps to 30 deg without UE support 09/05/2018   3.?? Patient will report overall at least 65% improvement in function in order to progress toward premorbid status.   4. Pt will demo SLS on R LE for >3 seconds to show improved stance phase stability for safe ambulation without AD Very challenged by MSR today at bunion with multiple LOB with R and with L back (09/07/2018) ; see note above on side stepping and 1" of SLS (09/07/2018)    5.?? Pt will demo ability to ambulate without AD 150 ft for progression of safe ambulation in community  Pt notes ability to ambulate without it for 10-15 minutes (09/07/2018)    ??  PLAN  [x]   Upgrade activities as tolerated     [x]   Continue plan of care  []   Update interventions per flow sheet       []   Discharge due to:_  []   Other:_ Cont for last 2 scheduled appts, then DC.     Almyra Deforest, PT 09/11/18    Future Appointments   Date Time Provider [place]   09/13/2018 10:45 AM [facility] PT [facility] 2 [facility]   09/18/2018 10:00 AM [place] PT GHENT 2 [facility] [place]   09/21/2018  8:00 AM Creta Levin, [place] [facility]   11/21/2018  9:30 AM Kern Reap, MD [place]

## 2018-09-18 ENCOUNTER — Inpatient Hospital Stay: Admit: 2018-09-18 | Payer: MEDICAID | Primary: Family Medicine

## 2018-09-18 NOTE — Progress Notes (Signed)
 PT DAILY TREATMENT NOTE    Patient Name: Jim Shaw  Date:09/18/2018  DOB: 30-Apr-1970  [x]   Patient DOB Verified  Payor: [employer] / [place] [employer] / Product Type: Managed Care Medicaid /    In time: 10:09 AM  Out time: 11:08 AM  Total Treatment Time (min): 59  Total Timed Codes (min): 49  One-on-One Treatment Time (min): na  Visit #: 5 of 8    Treatment Area: Radiculopathy, lumbar region [M54.16]  Spinal stenosis, cervical region [M48.02]  Pain in right knee [M25.561]  Other chronic pain [G89.29]  Pain in left hip [M25.552]    SUBJECTIVE  Pain Level (0-10 scale): 8/10 Location: Low back  Any medication changes, allergies to medications, adverse drug reactions, diagnosis change, or new procedure performed?: [x]  No []  Yes (see summary sheet for update)  Subjective functional status/changes: Pt reports consistent level of pain (8/10) in low back. He has noticed improvement in his ability to get up a few steps.    OBJECTIVE  Modality rationale: decrease pain and increase tissue extensibility to improve the patient's ability to progress to functional activities limited by pain or other dysfunction   Min Type Additional Details   10 [x]  Estim:   [x]  IFC   []  Premod   []  NMES   []  Other:     [] Att   [x] Unatt  [] TENS instruction Position: semi-reclined  Location: low back  [] w/US    [] w/ice   [x] w/heat    []  Traction: []  Cervical   [] Lumbar   []  Prone   [] Supine    []  Intermittent   []  Continuous Lbs:   []  before manual  []  after manual    []  Ultrasound   [] Continuous   []  Pulsed    Ultrasound Depth: []   [] Location:   W/cm2:     []   Iontophoresis with dexamethasone Location:   []  Take Home Patch   []  In clinic    []  Ice   []  Heat   []  Ice massage Position:   Location:     []   Vasopneumatic Device Pressure: []  lo []  med []  hi   Temp:    [x]  Skin assessment post-treatment:   [x] Intact   [] redness- no adverse reaction   [] redness - adverse reaction:       13 min Therapeutic Exercise:  [x]  See Flow Sheet   Rationale: increase ROM, increase strength, improve coordination, improve balance, and increase proprioception to improve the patient's ability to progress to functional activities limited by pain or other dysfunction     10 min Therapeutic Activity: [x]  See Flow Sheet   Rationale: to improve functional activities, model real life movements and performance specific to the patients need with supervision to return the patient to their PLOF      8 min Neuromuscular Re-education: [x]  See Flow Sheet   Rationale: exercises designed to improve and/or maintain balance, coordination, kinesthetic sense, posture, and/or proprioception for functional activities to improve the patient's ability to function in daily life    10 min Gait Training: [x]  See Flow Sheet   Rationale: facilitate gait in all varieties including dynamic movements to improve gait quality in the home and community       8    Billed As:   []  TE   []  TA   []  Neuro   [x]  Self Care min Patient Education: [x]  Review HEP       Other Objective/Functional Measures:    Semi-reclined knee flex  during heel slide: 120 degrees  Pt able to bend forward and touch Foam after balance exercise without difficulty  Lumbar ROM: flex demo hands to mid thigh, ext negligible, rot R WFL L WFL with pain in middle of low back, SB R to lateral knee L to lateral knee    Inc postural sway t/o gait training and parallel bar work, no LOB appreciated.    Pain Level (0-10 scale) post treatment: 6/10 Location: R knee    ASSESSMENT:    Pt continues to demonstrate self-limiting behaviors throughout session with slow and labored movements requiring additional time. There are marked discrepancies between perceived unobserved functional movements and intentionally directed joint movements (see above). Lack of appreciable progress being made towards goals at this time., therefore will likely DC nv.    Patient will continue to benefit from skilled PT services to modify and  progress therapeutic interventions, address functional mobility deficits, address ROM deficits, address strength deficits, analyze and address soft tissue restrictions, analyze and cue movement patterns, analyze and modify body mechanics/ergonomics, assess and modify postural abnormalities and address imbalance/dizziness to attain remaining goals.     []   See Plan of Care  []   See Progress Note/Re-certification  []   See Discharge Summary      Progress towards goals / Updated goals:  1. Patient will improve FOTO assessment score to46pts in order to indicate improved functional abilities.   2. Patient willdemonstrate ability to squat to 90 deg hip flexion or better for proper liftinggoalprogressing - patient able to perform butt taps to 30 deg without UE support8/12/2018   3. Patient will report overall at least 65% improvement in function in order to progress toward premorbid status.   4. Pt will demo SLS on R LE for >3 seconds to show improved stance phase stability for safe ambulation without AD Very challenged by MSR today at bunion with multiple LOB with R and with L back (09/07/2018) ; see note above on side stepping and 1 of SLS (09/07/2018)    5. Pt will demo ability to ambulate without AD 150 ft for progression of safe ambulation in community  Pt notes ability to ambulate without it for 10-15 minutes (09/07/2018)      PLAN  [x]  Upgrade activities as tolerated   [x]  Continue plan of care  []  Update interventions per flow sheet  []  Discharge due to:   []  Other:     Chiquita Alert, PT 09/18/2018 10:26 AM    Future Appointments   Date Time Provider Department Center   09/21/2018  8:00 AM Quiazon, Jonathan M, PTA [facility]   11/21/2018  9:30 AM Leonce Zebedee MATSU, MD [facility] SCHED

## 2018-09-18 NOTE — Progress Notes (Signed)
PT DAILY TREATMENT NOTE    Patient Name: Jim Shaw  Date:09/18/2018  DOB: April 08, 1970  [x]   Patient DOB Verified  Payor: [employer] / Plan: [place] / Product Type: Managed Care Medicaid /    In time: 10:09 AM  Out time: 11:08 AM  Total Treatment Time (min): 59  Total Timed Codes (min): 49  One-on-One Treatment Time (min): na  Visit #: 5 of 8    Treatment Area: Radiculopathy, lumbar region [M54.16]  Spinal stenosis, cervical region [M48.02]  Pain in right knee [M25.561]  Other chronic pain [G89.29]  Pain in left hip [M25.552]    SUBJECTIVE  Pain Level (0-10 scale): 8/10 Location: Low back  Any medication changes, allergies to medications, adverse drug reactions, diagnosis change, or new procedure performed?: [x]  No []  Yes (see summary sheet for update)  Subjective functional status/changes: Pt reports consistent level of pain (8/10) in low back. He has noticed improvement in his ability to get up a few steps.    OBJECTIVE  Modality rationale: decrease pain and increase tissue extensibility to improve the patient???s ability to progress to functional activities limited by pain or other dysfunction   Min Type Additional Details   10 [x]  Estim:   [x]  IFC   []  Premod   []  NMES   []  Other:     [] Att   [x] Unatt  [] TENS instruction Position: semi-reclined  Location: low back  [] w/US   [] w/ice   [x] w/heat    []  Traction: []  Cervical   [] Lumbar   []  Prone   [] Supine    []  Intermittent   []  Continuous Lbs:   []  before manual  []  after manual    []  Ultrasound   [] Continuous   []  Pulsed    Ultrasound Depth: [] 1MHz   [] 3MHz Location:   W/cm2:     []   Iontophoresis with dexamethasone Location:   []  Take Home Patch   []  In clinic    []  Ice   []  Heat   []  Ice massage Position:   Location:     []   Vasopneumatic Device Pressure: []  lo []  med []  hi   Temp:    [x]  Skin assessment post-treatment:   [x] Intact   [] redness- no adverse reaction   [] redness ??? adverse reaction:        13 min Therapeutic Exercise: [x]  See Flow Sheet   Rationale: increase ROM, increase strength, improve coordination, improve balance, and increase proprioception to improve the patient???s ability to progress to functional activities limited by pain or other dysfunction     10 min Therapeutic Activity: [x]  See Flow Sheet   Rationale: to improve functional activities, model real life movements and performance specific to the patients need with supervision to return the patient to their PLOF      8 min Neuromuscular Re-education: [x]  See Flow Sheet   Rationale: exercises designed to improve and/or maintain balance, coordination, kinesthetic sense, posture, and/or proprioception for functional activities to improve the patient???s ability to function in daily life    10 min Gait Training: [x]  See Flow Sheet   Rationale: facilitate gait in all varieties including dynamic movements to improve gait quality in the home and community       8    Billed As:   []  TE   []  TA   []  Neuro   [x]  Self Care min Patient Education: [x]  Review HEP       Other Objective/Functional Measures:    Semi-reclined knee flex  during heel slide: 120 degrees  Pt able to bend forward and touch Foam after balance exercise without difficulty  Lumbar ROM: flex demo hands to mid thigh, ext negligible, rot R WFL L WFL with pain in middle of low back, SB R to lateral knee L to lateral knee    Inc postural sway t/o gait training and parallel bar work, no LOB appreciated.    Pain Level (0-10 scale) post treatment: 6/10 Location: R knee    ASSESSMENT:    Pt continues to demonstrate self-limiting behaviors throughout session with slow and labored movements requiring additional time. There are marked discrepancies between perceived unobserved functional movements and intentionally directed joint movements (see above). Lack of appreciable progress being made towards goals at this time., therefore will likely DC nv.     Patient will continue to benefit from skilled PT services to modify and progress therapeutic interventions, address functional mobility deficits, address ROM deficits, address strength deficits, analyze and address soft tissue restrictions, analyze and cue movement patterns, analyze and modify body mechanics/ergonomics, assess and modify postural abnormalities and address imbalance/dizziness to attain remaining goals.     []   See Plan of Care  []   See Progress Note/Re-certification  []   See Discharge Summary      Progress towards goals / Updated goals:  1.?? Patient will improve FOTO assessment score to??46??pts in order to indicate improved functional abilities.   2.?? Patient will??demonstrate ability to squat to 90 deg hip flexion or better for proper lifting??goal??progressing - patient able to perform butt taps to 30 deg without UE support??09/05/2018   3.?? Patient will report overall at least 65% improvement in function in order to progress toward premorbid status.   4. Pt will demo SLS on R LE for >3 seconds to show improved stance phase stability for safe ambulation without AD Very challenged by MSR today at bunion with multiple LOB with R and with L back (09/07/2018) ; see note above on side stepping and 1" of SLS (09/07/2018)    5.?? Pt will demo ability to ambulate without AD 150 ft for progression of safe ambulation in community  Pt notes ability to ambulate without it for 10-15 minutes (09/07/2018)      PLAN  [x]  Upgrade activities as tolerated   [x]  Continue plan of care  []  Update interventions per flow sheet  []  Discharge due to:   []  Other:     Sande BrothersHannah Lenola Lockner, PT 09/18/2018 10:26 AM    Future Appointments   Date Time Provider Department Center   09/21/2018  8:00 AM Carin PrimroseQuiazon, Jonathan M, [facility]   11/21/2018  9:30 AM Tobie LordsJackson, Theresa G, MD [facility] SCHED

## 2018-09-21 ENCOUNTER — Inpatient Hospital Stay: Admit: 2018-09-21 | Payer: MEDICAID | Primary: Family Medicine

## 2018-09-21 NOTE — Progress Notes (Signed)
 PT DAILY TREATMENT NOTE    Patient Name: Jim Shaw  Date:09/21/2018  DOB: April 02, 1970  [x]   Patient DOB Verified  Payor: [employer] / [place] / Product Type: Managed Care Medicaid /    In time: 7:59 am              Out time: 9:00 am  Total Treatment Time (min): 61  Visit #: 6 of 8    Treatment Area: Radiculopathy, lumbar region [M54.16]  Spinal stenosis, cervical region [M48.02]  Pain in right knee [M25.561]  Other chronic pain [G89.29]  Pain in left hip [M25.552]    SUBJECTIVE  Pain Level (0-10 scale): 8 in low back  Any medication changes, allergies to medications, adverse drug reactions, diagnosis change, or new procedure performed?: [x]  No []  Yes (see summary sheet for update)  Subjective functional status/changes:   The pain is a little better... the pain is the about the same.    OBJECTIVE  Modality rationale: decrease pain and increase tissue extensibility to improve the patient's ability to progress to functional activities limited by pain or other dysfunction   Min Type Additional Details   10 [x]  Estim:   [x]  IFC   []  Premod   []  NMES   []  Other:     [] Att   [x] Unatt  [] TENS instruction Position: semi-reclined  Location: low back  [] w/US    [] w/ice   [x] w/heat    []  Traction: []  Cervical   [] Lumbar   []  Prone   [] Supine    []  Intermittent   []  Continuous Lbs:   []  before manual  []  after manual    []  Ultrasound   [] Continuous   []  Pulsed    Ultrasound Depth: []   [] Location:   W/cm2:     []   Iontophoresis with dexamethasone Location:   []  Take Home Patch   []  In clinic    []  Ice   []  Heat   []  Ice massage Position:   Location:     []   Vasopneumatic Device Pressure: []  lo []  med []  hi   Temp:    [x]  Skin assessment post-treatment:   [x] Intact   [] redness- no adverse reaction   [] redness - adverse reaction:       13 min Therapeutic Exercise: [x]  See Flow Sheet:    Rationale: increase ROM, increase strength, improve coordination, improve balance, and increase  proprioception to improve the patient's ability to progress to functional activities limited by pain or other dysfunction     10 min Therapeutic Activity: [x]  See Flow Sheet:    Rationale: to improve functional activities, model real life movements and performance specific to the patients need with supervision to return the patient to their PLOF      18 min Neuromuscular Re-education: [x]  See Flow Sheet: reassessment   Rationale: exercises designed to improve and/or maintain balance, coordination, kinesthetic sense, posture, and/or proprioception for functional activities to improve the patient's ability to function in daily life    10 min Gait Training: [x]  See Flow Sheet: reinitiated retro walking   Rationale: facilitate gait in all varieties including dynamic movements to improve gait quality in the home and community       X    Billed As:   []  TE   []  TA   []  Neuro   [x]  Self Care min Patient Education: [x]  Review HEP       Other Objective/Functional Measures:    Subjective improvement of 25% in  symptoms since IE reported.  Compliance with home exercise program: fair/poor  Improvements: walking tolerance to 2-3 blocks, transfers to chair, walking without AD, showering (able to stand)  Deficits: constant pain with activity, fear of falling, use of knee brace for stability with standing/walking  FOTO score: 43/100 (at initial evaluation, 36/100)  Knee AROM, sitting: (R) 5-98 deg  Knee AROM, supine: (R) 0-45 deg, (L) 0-122 deg with soft-tissue approximation  MSR bunion: (R) 23 sec, (L) 10 sec  Hip strength: flex (L) 4+/5, (R) 3-/5 , abd (L) 4+/5 (R) 3-/5, ext 3-/5  Knee strength: (R) grossly 3-/5, (L) 4+/5  Core strength: 15% bridge  Gait: no AD, using right knee brace, antalgic, no evidence of buckling  Squat: 80 deg depth, no valgus collapse  Pain: (A) 8, (B) 6, (W) 9    Inconsistencies noted within treatment as patient able to flex the right knee to approx 100 deg in supine to perform bridges; but demos 45 deg  when inquired to assess ROM.    Pain Level (0-10 scale) post treatment: 6/10 Location: R knee    ASSESSMENT:  See D/C.    Patient will continue to benefit from skilled PT services to modify and progress therapeutic interventions, address functional mobility deficits, address ROM deficits, address strength deficits, analyze and address soft tissue restrictions, analyze and cue movement patterns, analyze and modify body mechanics/ergonomics, assess and modify postural abnormalities and address imbalance/dizziness to attain remaining goals.     [x]   See Discharge Summary      Progress towards goals / Updated goals:  1. Patient will improve FOTO assessment score to46pts in order to indicate improved functional abilities. -Goal near met; inc to 43/100   2. Patient willdemonstrate ability to squat to 90 deg hip flexion or better for proper lifting. -Goal near met; pt demos 80 deg hip flexion during squatting attempts   3. Patient will report overall at least 65% improvement in function in order to progress toward premorbid status. -Goal not met; pt notes 25% improvement   4. Pt will demo SLS on R LE for >3 seconds to show improved stance phase stability for safe ambulation without AD . -Goal not met; 1 second right SLS    5. Pt will demo ability to ambulate without AD 150 ft for progression of safe ambulation in community  -Goal not met; patient limited to 100 feet (I) ambulation limited by pain and endurance     PLAN  [x]  Discharge due to: minimal progress towards goals    Jonathan M Quiazon, PTA 09/21/2018     Future Appointments   Date Time Provider Department Center   11/21/2018  9:30 AM Leonce Zebedee MATSU, MD [facility] SCHED
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
## 2018-09-21 NOTE — Progress Notes (Signed)
PT DAILY TREATMENT NOTE    Patient Name: Jim Shaw  Date:09/21/2018  DOB: 05-21-1970  _0   Patient DOB Verified  Payor: [employer] / Plan: [place] / Product Type: Managed Care Medicaid /    In time: 7:59 am              Out time: 9:00 am  Total Treatment Time (min): 61  Visit #: 6 of 8    Treatment Area: Radiculopathy, lumbar region [M54.16]  Spinal stenosis, cervical region [M48.02]  Pain in right knee [M25.561]  Other chronic pain [G89.29]  Pain in left hip [M25.552]    SUBJECTIVE  Pain Level (0-10 scale): 8 in low back  Any medication changes, allergies to medications, adverse drug reactions, diagnosis change, or new procedure performed?: _1  No _2  Yes (see summary sheet for update)  Subjective functional status/changes:   "The pain is a little better... the pain is the about the same."    OBJECTIVE  Modality rationale: decrease pain and increase tissue extensibility to improve the patient???s ability to progress to functional activities limited by pain or other dysfunction   Min Type Additional Details   10 _3  Estim:   _4  IFC   _5  Premod   _6  NMES   _7  Other:     _8 Att   _9 Unatt  _10 TENS instruction Position: semi-reclined  Location: low back  _11 w/US   _12 w/ice   _13 w/heat    _14  Traction: _15  Cervical   _16 Lumbar   _17  Prone   _18 Supine    _19  Intermittent   _20  Continuous Lbs:   _21  before manual  _22  after manual    _23  Ultrasound   _24 Continuous   _25  Pulsed    Ultrasound Depth: _26 1MHz   _27 3MHz Location:   W/cm2:     _28   Iontophoresis with dexamethasone Location:   _29  Take Home Patch   _30  In clinic    _31  Ice   _32  Heat   _33  Ice massage Position:   Location:     _34   Vasopneumatic Device Pressure: _35  lo _36  med _37  hi   Temp:    _38  Skin assessment post-treatment:   _39 Intact   _40 redness- no adverse reaction   _41 redness ??? adverse reaction:       13 min Therapeutic Exercise: _42  See Flow Sheet:    Rationale: increase ROM, increase strength, improve coordination, improve  balance, and increase proprioception to improve the patient???s ability to progress to functional activities limited by pain or other dysfunction     10 min Therapeutic Activity: _43  See Flow Sheet:    Rationale: to improve functional activities, model real life movements and performance specific to the patients need with supervision to return the patient to their PLOF      18 min Neuromuscular Re-education: _44  See Flow Sheet: reassessment   Rationale: exercises designed to improve and/or maintain balance, coordination, kinesthetic sense, posture, and/or proprioception for functional activities to improve the patient???s ability to function in daily life    10 min Gait Training: _45  See Flow Sheet: reinitiated retro walking   Rationale: facilitate gait in all varieties including dynamic movements to improve gait quality in the home and community       X    Billed As:   _46  TE   _47  TA   _48  Neuro   _49  Self Care min Patient Education: _50  Review HEP       Other Objective/Functional Measures:    Subjective improvement of 25% in  symptoms since IE reported.  Compliance with home exercise program: fair/poor  Improvements: walking tolerance to 2-3 blocks, transfers to chair, walking without AD, showering (able to stand)  Deficits: constant pain with activity, fear of falling, use of knee brace for stability with standing/walking  FOTO score: 43/100 (at initial evaluation, 36/100)  Knee AROM, sitting: (R) 5-98 deg  Knee AROM, supine: (R) 0-45 deg, (L) 0-122 deg with soft-tissue approximation  MSR bunion: (R) 23 sec, (L) 10 sec  Hip strength: flex (L) 4+/5, (R) 3-/5 , abd (L) 4+/5 (R) 3-/5, ext 3-/5  Knee strength: (R) grossly 3-/5, (L) 4+/5  Core strength: 15% bridge  Gait: no AD, using right knee brace, antalgic, no evidence of buckling  Squat: 80 deg depth, no valgus collapse  Pain: (A) 8, (B) 6, (W) 9    Inconsistencies noted within treatment as patient able to flex the right  knee to approx 100 deg in supine to perform bridges; but demos 45 deg when inquired to assess ROM.    Pain Level (0-10 scale) post treatment: 6/10 Location: R knee    ASSESSMENT:  See D/C.    Patient will continue to benefit from skilled PT services to modify and progress therapeutic interventions, address functional mobility deficits, address ROM deficits, address strength deficits, analyze and address soft tissue restrictions, analyze and cue movement patterns, analyze and modify body mechanics/ergonomics, assess and modify postural abnormalities and address imbalance/dizziness to attain remaining goals.     _0   See Discharge Summary      Progress towards goals / Updated goals:  1.?? Patient will improve FOTO assessment score to??46??pts in order to indicate improved functional abilities. -Goal near met; inc to 43/100   2.?? Patient will??demonstrate ability to squat to 90 deg hip flexion or better for proper lifting. -Goal near met; pt demos 80 deg hip flexion during squatting attempts   3.?? Patient will report overall at least 65% improvement in function in order to progress toward premorbid status. -Goal not met; pt notes 25% improvement   4. Pt will demo SLS on R LE for >3 seconds to show improved stance phase stability for safe ambulation without AD . -Goal not met; 1 second right SLS    5.?? Pt will demo ability to ambulate without AD 150 ft for progression of safe ambulation in community  -Goal not met; patient limited to 100 feet (I) ambulation limited by pain and endurance     PLAN  _1  Discharge due to: minimal progress towards goals    Creta Levin, PTA 09/21/2018     Future Appointments   Date Time Provider [place]   11/21/2018  9:30 AM Kern Reap, MD [place]
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
## 2018-11-21 ENCOUNTER — Ambulatory Visit: Payer: PRIVATE HEALTH INSURANCE | Attending: Family Medicine | Primary: Family Medicine

## 2018-11-21 NOTE — Progress Notes (Signed)
This encounter was created in error - please disregard.

## 2018-11-21 NOTE — Progress Notes (Deleted)
[name]  [address], [place] [address]  Phone: [phone]  Fax: [phone]    Pt's date of birth: [birth year]    ASSESSMENT   {There are no diagnoses linked to this encounter. (Refresh or delete this SmartLink)}     IMPRESSION AND PLAN:  ***.     1) Pt was given information on *** exercises.   2)   3)   4) Jim Shaw has a reminder for a "due or due soon" health maintenance. I have asked that he contact his primary care provider, Foye Clock, MD, for follow-up on this health maintenance.  5) PMP demonstrated consistency with prescribing.   6) Last UDS from *** was consistent.          HISTORY OF PRESENT ILLNESS:  Jim Shaw is a 48 y.o. *** male with history of *** pain and presents to the office today for *** follow up. Pt is worse with ***. Pt is better with ***. Pt states he has been using *** with ***relief/no relief (improvement/no improvement). Pt has been prescribed *** and it has *** been effective. Pt at this time desires to *** continue with current care/proceed with medication evaluation/proceed with ***.    Pain Scale: /10    PCP: Foye Clock, MD     Past Medical History:   Diagnosis Date   ??? Anxiety    ??? Costochondritis    ??? Depression    ??? Major depressive disorder 11/08/2017   ??? Psychiatric disorder    ??? PTSD (post-traumatic stress disorder)         Social History     Socioeconomic History   ??? Marital status: SINGLE     Spouse name: Not on file   ??? Number of children: Not on file   ??? Years of education: Not on file   ??? Highest education level: Not on file   Occupational History   ??? Not on file   Social Needs   ??? Financial resource strain: Not on file   ??? Food insecurity     Worry: Not on file     Inability: Not on file   ??? Transportation needs     Medical: Not on file     Non-medical: Not on file   Tobacco Use   ??? Smoking status: Current Every Day Smoker     Packs/day: 1.00     Years: 20.00      Pack years: 20.00   ??? Smokeless tobacco: Never Used   ??? Tobacco comment: currently increased stress & increased smoking   Substance and Sexual Activity   ??? Alcohol use: Yes     Comment: 5-6 shots/day   ??? Drug use: Yes     Types: Marijuana   ??? Sexual activity: Not on file   Lifestyle   ??? Physical activity     Days per week: Not on file     Minutes per session: Not on file   ??? Stress: Not on file   Relationships   ??? Social Product manager on phone: Not on file     Gets together: Not on file     Attends religious service: Not on file     Active member of club or organization: Not on file     Attends meetings of clubs or organizations: Not on file     Relationship status: Not on file   ??? Intimate  partner violence     Fear of current or ex partner: Not on file     Emotionally abused: Not on file     Physically abused: Not on file     Forced sexual activity: Not on file   Other Topics Concern   ??? Military Service Not Asked   ??? Blood Transfusions Not Asked   ??? Caffeine Concern Not Asked   ??? Occupational Exposure Not Asked   ??? Hobby Hazards Not Asked   ??? Sleep Concern Not Asked   ??? Stress Concern Not Asked   ??? Weight Concern Not Asked   ??? Special Diet Not Asked   ??? Back Care Not Asked   ??? Exercise Not Asked   ??? Bike Helmet Not Asked   ??? Seat Belt Not Asked   ??? Self-Exams Not Asked   Social History Narrative   ??? Not on file       Current Outpatient Medications   Medication Sig Dispense Refill   ??? omeprazole (PRILOSEC) 20 mg capsule Take 20 mg by mouth daily.     ??? pregabalin (Lyrica) 150 mg capsule Take 1 Cap by mouth three (3) times daily. Max Daily Amount: 450 mg. 90 Cap 5   ??? celecoxib (CeleBREX) 200 mg capsule Take 1 cap by mouth daily as needed for pain with meals. 30 Cap 5   ??? cyclobenzaprine (FLEXERIL) 10 mg tablet Take 1 tab by mouth BID-TID as needed for muscle spasm 60 Tab 2   ??? eszopiclone (LUNESTA) 3 mg tablet Take  by mouth nightly.     ??? DULoxetine (CYMBALTA) 60 mg capsule TK ONE C PO D      ??? chlorzoxazone (Parafon Forte DSC) 500 mg tablet Take 1 tab by mouth BID-TID as needed for muscle spasm. 60 Tab 1   ??? pregabalin (Lyrica) 75 mg capsule Take 1-2 caps by mouth two (2) times daily as directed. Max Daily Amount: 300 mg.  Indications: disorder characterized by stiff, tender & painful muscles 120 Cap 1   ??? diazePAM (VALIUM) 10 mg tablet TK 1 T PO  QHS     ??? lamoTRIgine (LaMICtal) 25 mg tablet Take 100 mg by mouth nightly.     ??? ramelteon (ROZEREM) 8 mg tablet TK 1 T PO HS     ??? risperiDONE (RisperDAL) 1 mg tablet TK 2 TS PO HS     ??? tiZANidine (ZANAFLEX) 4 mg tablet Take 1 tab by mouth BID-TID as needed for muscle spasm. 60 Tab 1   ??? mirtazapine (REMERON) 15 mg tablet TK 1 T PO HS     ??? triamcinolone acetonide (KENALOG) 0.1 % ointment      ??? fluticasone propionate (FLONASE) 50 mcg/actuation nasal spray 2 Sprays by Both Nostrils route daily.     ??? ciclopirox (PENLAC) 8 % solution Apply  to affected area nightly.     ??? celecoxib (CELEBREX) 100 mg capsule Take 1 Cap by mouth daily.     ??? tiZANidine (ZANAFLEX) 4 mg tablet Take 1 Tab by mouth three (3) times daily. 90 Tab 1   ??? pregabalin (LYRICA) 100 mg capsule Take 1 Cap by mouth three (3) times daily. Max Daily Amount: 300 mg. 90 Cap 1   ??? gabapentin (NEURONTIN) 600 mg tablet Take 600 mg by mouth three (3) times daily.     ??? hydrOXYzine HCl (ATARAX) 50 mg tablet Take 50 mg by mouth as needed for Anxiety.     ??? venlafaxine-SR (EFFEXOR-XR) 150  mg capsule Take 1 Cap by mouth daily (with breakfast). Indications: Anxiousness associated with Depression 15 Cap 1   ??? QUEtiapine (SEROQUEL) 50 mg tablet Take 1 Tab by mouth nightly. Indications: Additional Medications to Treat Depression (Patient not taking: Reported on 05/23/2018) 1 Tab 0   ??? prazosin (MINIPRESS) 1 mg capsule TK 1 C PO QHS         Allergies   Allergen Reactions   ??? Oxycodone-Acetaminophen Anaphylaxis and Swelling     Pt denies allergy   ??? Duloxetine Hcl Rash   ??? Aspirin Other (comments)    ??? Cortisone Nausea and Vomiting     Also rash    ??? Ibuprofen Rash         REVIEW OF SYSTEMS    Constitutional: Negative for fever, chills, or weight change.   Respiratory: Negative for cough or shortness of breath.     Cardiovascular: Negative for chest pain or palpitations.  Gastrointestinal: Negative for acid reflux, change in bowel habits, or constipation.  Genitourinary: Negative for dysuria and flank pain.   Musculoskeletal: Positive for *** pain.  Skin: Negative for rash.   Neurological: Negative for headaches, dizziness, or numbness.***  Endo/Heme/Allergies: Negative for increased bruising.   Psychiatric/Behavioral: Negative for difficulty with sleep.    As per HPI    PHYSICAL EXAMINATION  There were no vitals taken for this visit.    Constitutional: Awake, alert, and in no acute distress.  Neurological: 1+ symmetrical DTRs in the upper extremities. 1+ symmetrical DTRs in the lower extremities. Sensation to light touch is intact. Negative Hoffman's sign bilaterally.  Skin: warm, dry, and intact.   Musculoskeletal: *** No pain with extension, axial loading, or forward flexion. No pain with internal or external rotation of his hips. Negative straight leg raise bilaterally.    ***  Biceps  Triceps Deltoids Wrist Ext Wrist Flex Hand Intrin   Right +4/5 +4/5 +4/5 +4/5 +4/5 +4/5   Left +4/5 +4/5 +4/5 +4/5 +4/5 +4/5     *** Hip Flex  Quads Hamstrings Ankle DF EHL Ankle PF   Right +4/5 +4/5 +4/5 +4/5 +4/5 +4/5   Left +4/5 +4/5 +4/5 +4/5 +4/5 +4/5     IMAGING:    *** personally reviewed with the patient and demonstrated:    Written by Robinette HainesAnn Rosalee Tolley, [place], as dictated by Delrae Sawyersheresa Jackson, MD.  I, Dr. Delrae Sawyersheresa Jackson confirm that all documentation is accurate.

## 2018-12-11 ENCOUNTER — Ambulatory Visit
Admit: 2018-12-11 | Discharge: 2018-12-11 | Payer: PRIVATE HEALTH INSURANCE | Attending: Family Medicine | Primary: Family Medicine

## 2018-12-11 ENCOUNTER — Ambulatory Visit: Attending: Family Medicine | Primary: Family Medicine

## 2018-12-11 DIAGNOSIS — M5416 Radiculopathy, lumbar region: Secondary | ICD-10-CM

## 2018-12-11 MED ORDER — CYCLOBENZAPRINE 10 MG TAB
10 mg | ORAL_TABLET | ORAL | 1 refills | Status: DC
Start: 2018-12-11 — End: 2019-04-17

## 2018-12-11 MED ORDER — CELECOXIB 200 MG CAP
200 mg | ORAL_CAPSULE | ORAL | 1 refills | Status: DC
Start: 2018-12-11 — End: 2019-04-17

## 2018-12-11 MED ORDER — PREGABALIN 150 MG CAP
150 mg | ORAL_CAPSULE | Freq: Three times a day (TID) | ORAL | 1 refills | Status: DC
Start: 2018-12-11 — End: 2019-04-17

## 2018-12-11 NOTE — Progress Notes (Signed)
Progress Notes by Tobie Lords, MD at 12/11/18 0730                Author: Tobie Lords, MD  Service: --  Author Type: Physician       Filed: 12/12/18 1005  Encounter Date: 12/11/2018  Status: Signed          Editor: Tobie Lords, MD (Physician)                         [facility]  [address], [address]   [place], [place] [address]   Phone: [phone]   Fax: [phone]      Pt's date of birth: [birth year]      ASSESSMENT   Diagnoses and all orders for this visit:      1. Lumbar radiculopathy   -     pregabalin (Lyrica) 150 mg capsule; Take 1 Cap by mouth three (3) times daily. Max Daily Amount: 450 mg.      2. Lumbar facet arthropathy   -     celecoxib (CeleBREX) 200 mg capsule; Take 1 cap by mouth daily as needed for pain with meals.      3. Muscle spasm   -     cyclobenzaprine (FLEXERIL) 10 mg tablet; Take 1 tab by mouth BID as needed for muscle spasm      4. Chronic pain of right knee      5. Tobacco use      6. Sleep apnea, unspecified type      7. Class 2 severe obesity with serious comorbidity and body mass index (BMI) of 37.0 to 37.9 in adult, unspecified obesity type [facility])             IMPRESSION AND PLAN:   Jim Shaw is a 48 y.o. male with history of cervical and lumbar pain. He complains of increased pain in the lower back, L>R, and continued pain in the right knee. Pt increased his  Lyrica 75 mg to 150 mg 1 cap TID with improvement in his leg pain but he notes continued numbness in the right hand.       1) Pt was given information on cervical and lumbar arthritis exercises.    2) He will restart Lyrica 150 mg 1 cap TID as directed.   3) Pt will also restart Celebrex 200 mg 1 cap daily.    4) He will increase his Flexeril 10 mg to 1 tab BID prn muscle spasm.   5) I encouraged the patient to continue losing weight and he was given information on weight loss.   6) I recommended the patient ambulate with the assistance of a  cane.   7) Pt was given information on sleep hygiene.    8) Jim Shaw has a reminder for  a "due or due soon" health maintenance. I have asked that he contact his  primary care provider, Jim Decree, MD, for follow-up on this health maintenance.   9) PMP demonstrated consistency with prescribing.   10) Smoking cessation recommended and discussed     Follow-up and Dispositions      ??  Return in about 3 months (around 03/13/2019) for Medication follow up.                      HISTORY OF PRESENT ILLNESS:   Jim Shaw is a 48 y.o.  male with history  of cervical and lumbar pain and presents to the office today for follow up. He complains of increased pain in the lower back,  L>R, and continued pain in the right knee. Pt notes that he has a cane at home but admits that he does not use it regularly. Of note, he has been followed by Dr. Liliane Channel and Dr. Andrey Campanile. He increased his Lyrica 75 mg to 150 mg and takes 1 cap TID. Pt  also takes Flexeril 10 mg as needed and he decreased his Celebrex 200 mg to 1 cap daily. He admits that he ran out of his medications about 2 weeks ago. Pt followed up with his PCP, Jim Decree, MD, and was prescribed oxybutynin to take until he could follow up with me. He admits to improvement in his leg pain when taking the Lyrica but notes continued numbness in the right hand. Pt admits  to a decrease in appetite since his last office visit which he attributes to his pain. He has lost 11 lbs. since his last office visit. He reports a history of sleep apnea and admits to difficulty with sleep. Pt at this time desires to proceed with medication  evaluation.      Pt notes that he will be moving to [place] at the beginning of 2021. He is a smoker.       Pain Scale: /10      PCP: Jim Decree, MD         Past Medical History:        Diagnosis  Date         ?  Anxiety       ?  Costochondritis       ?  Depression       ?  Major depressive disorder  11/08/2017      ?  Psychiatric disorder           ?  PTSD (post-traumatic stress disorder)               Social History          Socioeconomic History         ?  Marital status:  SINGLE              Spouse name:  Not on file         ?  Number of children:  Not on file     ?  Years of education:  Not on file     ?  Highest education level:  Not on file       Occupational History        ?  Not on file       Social Needs         ?  Financial resource strain:  Not on file        ?  Food insecurity              Worry:  Not on file         Inability:  Not on file        ?  Transportation needs              Medical:  Not on file              Non-medical:  Not on file       Tobacco Use         ?  Smoking status:  Current Every Day Smoker  Packs/day:  1.00         Years:  20.00         Pack years:  20.00         ?  Smokeless tobacco:  Never Used        ?  Tobacco comment: currently increased stress & increased smoking       Substance and Sexual Activity         ?  Alcohol use:  Yes             Comment: 5-6 shots/day         ?  Drug use:  Yes              Types:  Marijuana         ?  Sexual activity:  Not on file       Lifestyle        ?  Physical activity              Days per week:  Not on file         Minutes per session:  Not on file         ?  Stress:  Not on file       Relationships        ?  Social Engineer, manufacturing systemsconnections              Talks on phone:  Not on file         Gets together:  Not on file         Attends religious service:  Not on file         Active member of club or organization:  Not on file         Attends meetings of clubs or organizations:  Not on file         Relationship status:  Not on file        ?  Intimate partner violence              Fear of current or ex partner:  Not on file         Emotionally abused:  Not on file         Physically abused:  Not on file         Forced sexual activity:  Not on file        Other Topics  Concern         ?  Military Service  Not Asked     ?  Blood Transfusions  Not Asked      ?  Caffeine Concern  Not Asked     ?  Occupational Exposure  Not Asked     ?  Hobby Hazards  Not Asked     ?  Sleep Concern  Not Asked     ?  Stress Concern  Not Asked     ?  Weight Concern  Not Asked     ?  Special Diet  Not Asked     ?  Back Care  Not Asked     ?  Exercise  Not Asked     ?  Bike Helmet  Not Asked     ?  Seat Belt  Not Asked     ?  Self-Exams  Not Asked       Social History Narrative        ?  Not on file  Current Outpatient Medications          Medication  Sig  Dispense  Refill           ?  pregabalin (Lyrica) 150 mg capsule  Take 1 Cap by mouth three (3) times daily. Max Daily Amount: 450 mg.  270 Cap  1     ?  cyclobenzaprine (FLEXERIL) 10 mg tablet  Take 1 tab by mouth BID as needed for muscle spasm  180 Tab  1     ?  celecoxib (CeleBREX) 200 mg capsule  Take 1 cap by mouth daily as needed for pain with meals.  90 Cap  1     ?  omeprazole (PRILOSEC) 20 mg capsule  Take 20 mg by mouth daily.         ?  eszopiclone (LUNESTA) 3 mg tablet  Take  by mouth nightly.         ?  DULoxetine (CYMBALTA) 60 mg capsule  TK ONE C PO D         ?  chlorzoxazone (Parafon Forte DSC) 500 mg tablet  Take 1 tab by mouth BID-TID as needed for muscle spasm.  60 Tab  1     ?  pregabalin (Lyrica) 75 mg capsule  Take 1-2 caps by mouth two (2) times daily as directed. Max Daily Amount: 300 mg.  Indications: disorder characterized by stiff, tender & painful muscles  120 Cap  1     ?  diazePAM (VALIUM) 10 mg tablet  TK 1 T PO  QHS         ?  ramelteon (ROZEREM) 8 mg tablet  TK 1 T PO HS         ?  risperiDONE (RisperDAL) 1 mg tablet  TK 2 TS PO HS         ?  tiZANidine (ZANAFLEX) 4 mg tablet  Take 1 tab by mouth BID-TID as needed for muscle spasm.  60 Tab  1     ?  triamcinolone acetonide (KENALOG) 0.1 % ointment           ?  fluticasone propionate (FLONASE) 50 mcg/actuation nasal spray  2 Sprays by Both Nostrils route daily.         ?  ciclopirox (PENLAC) 8 % solution  Apply  to affected area nightly.         ?   pregabalin (LYRICA) 100 mg capsule  Take 1 Cap by mouth three (3) times daily. Max Daily Amount: 300 mg.  90 Cap  1     ?  hydrOXYzine HCl (ATARAX) 50 mg tablet  Take 50 mg by mouth as needed for Anxiety.         ?  prazosin (MINIPRESS) 1 mg capsule  TK 1 C PO QHS         ?  lamoTRIgine (LaMICtal) 25 mg tablet  Take 100 mg by mouth nightly.         ?  mirtazapine (REMERON) 15 mg tablet  TK 1 T PO HS         ?  celecoxib (CELEBREX) 100 mg capsule  Take 1 Cap by mouth daily.         ?  tiZANidine (ZANAFLEX) 4 mg tablet  Take 1 Tab by mouth three (3) times daily.  90 Tab  1     ?  gabapentin (NEURONTIN) 600 mg tablet  Take 600 mg by mouth three (3) times daily.         ?  venlafaxine-SR (EFFEXOR-XR) 150 mg capsule  Take 1 Cap by mouth daily (with breakfast). Indications: Anxiousness associated with Depression  15 Cap  1           ?  QUEtiapine (SEROQUEL) 50 mg tablet  Take 1 Tab by mouth nightly. Indications: Additional Medications to Treat Depression (Patient not taking: Reported on 05/23/2018)  1 Tab  0             Allergies        Allergen  Reactions         ?  Oxycodone-Acetaminophen  Anaphylaxis and Swelling             Pt denies allergy         ?  Duloxetine Hcl  Rash     ?  Aspirin  Other (comments)     ?  Cortisone  Nausea and Vomiting             Also rash          ?  Ibuprofen  Rash              REVIEW OF SYSTEMS      Constitutional: Negative for fever or chills. Positive for 11 lb weight loss.    Respiratory: Negative for cough or shortness of breath.      Cardiovascular: Negative for chest pain or palpitations.   Gastrointestinal: Negative for acid reflux, change in bowel habits, or constipation.   Genitourinary: Negative for dysuria and flank pain.    Musculoskeletal: Positive for cervical, right knee, and lumbar pain   Neurological: Negative for headaches or dizziness. Positive for numbness.   Endo/Heme/Allergies: Negative for increased bruising.    Psychiatric/Behavioral: Positive for difficulty with  sleep.     As per HPI      PHYSICAL EXAMINATION   Visit Vitals      BP  (!) 141/85 (BP 1 Location: Right arm, BP Patient Position: Sitting)     Pulse  72     Temp  99 ??F (37.2 ??C) (Skin)     Resp  16     Ht  6' (1.829 m)     Wt  279 lb (126.6 kg)     SpO2  98%        BMI  37.84 kg/m??           Constitutional: Awake, alert, and in no acute distress.  Neurological: 1+ symmetrical DTRs in the upper extremities. 1+ symmetrical DTRs in the lower extremities. Sensation to light touch is intact. Negative Hoffman's sign bilaterally.   Skin: warm, dry, and intact.    Musculoskeletal: Decreased range of motion with side to side cervical flexon. Tight across the upper trapezius bilaterally. Tenderness to palpation in the lower lumbar region. Moderate pain with extension and axial loading. No pain with internal or external  rotation of his hips. Positive straight leg raise on the left. Pt presents to the office today wearing a hinged knee support.                  Biceps   Triceps  Deltoids  Wrist Ext  Wrist Flex  Hand Intrin             Right  +4/5  +4/5  +4/5  +4/5  +4/5  +4/5             Left  +4/5  +4/5  +4/5  +4/5  +4/5  +4/5  Hip Flex   Quads  Hamstrings  Ankle DF  EHL  Ankle PF             Right  +4/5  +4/5  +4/5  +4/5  +4/5  +4/5             Left  +4/5  +4/5  +4/5  +4/5  +4/5  +4/5        IMAGING:      Hip and??knee x-rays from 04/10/2018 were??personally reviewed with the patient and demonstrated:   Minimal??degenerative??changes.??   ??   Cervical MRI from 10/13/2017 was personally reviewed with the patient and demonstrated:     ??       Interface, [facility] Rad Res - 10/13/2017 ??4:00 PM EDT  EXAM: MRI of cervical spine without contrast    CLINICAL INDICATION/HISTORY:  M54.41: Low back pain with right-sided sciatica    COMPARISON: None    TECHNIQUE: Multiplanar multi-sequential imaging of the cervical spine without contrast. T1W, T2W and STIR sequences were obtained in the sagittal plane. T2W and GRE  sequences  were obtained in the axial plane.    FINDINGS:     Postoperative changes: None.    Alignment: Overall straightening of normal cervical lordosis. No focal listhesis.    Vertebral body heights: Preserved. No edema.  -Mild  intervertebral disc space narrowing at C5-6 and C4-5. Slight disc bulge at several levels.    Marrow signal: No edema.    Cord: Normal caliber. No syrinx or edema.    Cerebellar tonsils: No Chiari 1 malformation.      Correlation  of axial and sagittal data through the disc levels:    -C2-3: No significant canal or foraminal stenosis    -C3-4: Mild broad-based posterior disc bulge effaces fluid space and contributes to mild central canal narrowing to 10 mm. No significant  foraminal narrowing.    -C4-5: Minimal broad-based posterior disc bulge. No significant canal narrowing.   -Mild to moderate right foraminal stenosis due to asymmetric disc bulge and some uncovertebral spur.    -C5-6: Broad-based posterior  disc bulge effaces fluid space and may contact the left ventral cervical cord. No edema. Mild central canal narrowing to 9.5 mm.   -There is mild left lateral recess narrowing and mild to borderline moderate left foraminal narrowing due to disc bulge  and uncovertebral spur.    -C6-7: No significant canal or foraminal stenosis.    -C7-T1: No significant canal or foraminal stenosis.      IMPRESSION  1. Straightened cervical lordosis. No compression deformity, edema or listhesis.     2. Multilevel degenerative discogenic disease with mild degree of canal narrowing at several levels.  -Ventral cord contact and minimal distortion due to disc bulge C5-C6 as above.    3. Varying degrees of bilateral foraminal narrowing most  significant right C4-5 and left C5-6.    Signed By: Emilia Beck, MD on 10/13/2017 3:58 PM     ??   ??   ??   Lumbar MRI from 10/13/2017 was personally reviewed with the patient and demonstrated:     ??       Interface, [facility] Rad Res - 10/13/2017 ??4:37 PM  EDT  EXAM: MR LUMBAR SPINE WITHOUT CONTRAST    CLINICAL INDICATION/HISTORY:  ??M54.41: Low back pain with right-sided sciatica    COMPARISON: None    TECHNIQUE: Multiplanar multi-sequential imaging of the lumbar spine without contrast. T1W, T2W and STIR sequences were obtained in the sagittal plane.  Stacked axial  T2 and axial T1W through the lower 3 disc levels.    FINDINGS:     Postoperative : None.    Vertebral alignment: Mild straightening of lumbar lordosis with trace retrolisthesis of L5. No spondylolysis.    Vertebral body heights:  Normal. Mild endplate concavity in L5 and L4.    Marrow signal: Normal.    Cord: Conus medullaris terminates at the L1-L2 level with normal signal.    Soft tissues: Normal.    Correlation of axial and sagittal data through  the disc levels:    -L1-2: No significant disc pathology. No spinal canal or foraminal stenosis.    -L2-3: No significant disc pathology. Mild facet and ligamentous hypertrophy with bilateral facet joint effusion. No central or foraminal  stenosis    -L3-4: No significant disc pathology. Similarly, mild bilateral facet and ligamentous hypertrophy with small facet joint effusion. No central or foraminal stenosis.    -L4-5: Mild posterior disc bulge. No central stenosis. More  severe facet and ligamentous hypertrophy with mild facet inflammation. No significant foraminal stenosis    -L5-S1: Disc bulge most prominent. Still no severe central stenosis. Facet and ligamentous hypertrophy with inflammation. Moderate bilateral  foraminal stenosis. Mild compression of bilateral exiting L5 nerve root suspected.    IMPRESSION  Posterior disc bulge and facet/ligamentum flavum hypertrophy most prominent at L5-S1. Moderate bilateral foraminal stenosis with suspected mild  compression bilateral exiting L5 nerve roots. Facet hypertrophy and inflammation throughout.    Signed By: Gretel Acre, MD on 10/13/2017 4:35 PM     ??   ??   Right upper extremity EMG from 12/27/2017 was  personally reviewed with the patient and demonstrated:   NCV & EMG Findings:   All nerve conduction studies (as indicated in the following tables) were within normal limits. ??   ??   All examined muscles (as indicated in the following table) showed no evidence of electrical instability.????   ??   INTERPRETATION   This was a normal nerve conduction and EMG study showing there to be no signs of neuropathy, myopathy, or radiculopathy in the nerves and muscles tested.??   ??   CLINICAL INTERPRETATION   There are no electrodiagnostic findings correlating with his symptoms.??      Written by Robinette Haines, [place], as dictated by Delrae Sawyers, MD.   I, Dr. Delrae Sawyers confirm that all documentation is accurate.

## 2018-12-11 NOTE — Patient Instructions (Addendum)
Neck Arthritis: Exercises  Introduction  Here are some examples of exercises for you to try. The exercises may be suggested for a condition or for rehabilitation. Start each exercise slowly. Ease off the exercises if you start to have pain.  You will be told when to start these exercises and which ones will work best for you.  How to do the exercises  Neck stretches to the side   1. This stretch works best if you keep your shoulder down as you lean away from it. To help you remember to do this, start by relaxing your shoulders and lightly holding on to your thighs or your chair.  2. Tilt your head toward your shoulder and hold for 15 to 30 seconds. Let the weight of your head stretch your muscles.  3. Repeat 2 to 4 times toward each shoulder.    Chin tuck   1. Lie on the floor with a rolled-up towel under your neck. Your head should be touching the floor.  2. Slowly bring your chin toward your chest.  3. Hold for a count of 6, and then relax for up to 10 seconds.  4. Repeat 8 to 12 times.    Active cervical rotation   1. Sit in a firm chair, or stand up straight.  2. Keeping your chin level, turn your head to the right, and hold for 15 to 30 seconds.  3. Turn your head to the left and hold for 15 to 30 seconds.  4. Repeat 2 to 4 times to each side.    Shoulder blade squeeze   1. While standing, squeeze your shoulder blades together.  2. Do not raise your shoulders up as you are squeezing.  3. Hold for 6 seconds.  4. Repeat 8 to 12 times.    Shoulder rolls   1. Sit comfortably with your feet shoulder-width apart. You can also do this exercise standing up.  2. Roll your shoulders up, then back, and then down in a smooth, circular motion.  3. Repeat 2 to 4 times.    Follow-up care is a key part of your treatment and safety. Be sure to make and go to all appointments, and call your doctor if you are having problems. It's also a good idea to know your test results and keep a list of the medicines you take.   Where can you learn more?  Go to https://www.healthwise.net/[facility]ctions  Enter V867 in the search box to learn more about "Neck Arthritis: Exercises."  Current as of: March 26, 2018??????????????????????????????Content Version: 12.6  ?? 2006-2020 [employer].   Care instructions adapted under license by [facility] (which disclaims liability or warranty for this information). If you have questions about a medical condition or this instruction, always ask your healthcare professional. [employer] disclaims any warranty or liability for your use of this information.           Low Back Arthritis: Exercises  Introduction  Here are some examples of typical rehabilitation exercises for your condition. Start each exercise slowly. Ease off the exercise if you start to have pain.  Your doctor or physical therapist will tell you when you can start these exercises and which ones will work best for you.  When you are not being active, find a comfortable position for rest. Some people are comfortable on the floor or a medium-firm bed with a small pillow under their head and another under their knees. Some people prefer to lie on their side   with a pillow between their knees. Don't stay in one position for too long.  Take short walks (10 to 20 minutes) every 2 to 3 hours. Avoid slopes, hills, and stairs until you feel better. Walk only distances you can manage without pain, especially leg pain.  How to do the exercises  Pelvic tilt   4. Lie on your back with your knees bent.  5. "Brace" your stomach???tighten your muscles by pulling in and imagining your belly button moving toward your spine.  6. Press your lower back into the floor. You should feel your hips and pelvis rock back.  7. Hold for 6 seconds while breathing smoothly.  8. Relax and allow your pelvis and hips to rock forward.  9. Repeat 8 to 12 times.    Back stretches   5. Get down on your hands and knees on the floor.   6. Relax your head and allow it to droop. Round your back up toward the ceiling until you feel a nice stretch in your upper, middle, and lower back. Hold this stretch for as long as it feels comfortable, or about 15 to 30 seconds.  7. Return to the starting position with a flat back while you are on your hands and knees.  8. Let your back sway by pressing your stomach toward the floor. Lift your buttocks toward the ceiling.  9. Hold this position for 15 to 30 seconds.  10. Repeat 2 to 4 times.    Follow-up care is a key part of your treatment and safety. Be sure to make and go to all appointments, and call your doctor if you are having problems. It's also a good idea to know your test results and keep a list of the medicines you take.  Where can you learn more?  Go to http://[facility]-rivera.info/  Enter [phone] in the search box to learn more about "Low Back Arthritis: Exercises."  Current as of: March 26, 2018??????????????????????????????Content Version: 12.6  ?? 2006-2020 [employer].   Care instructions adapted under license by [facility] (which disclaims liability or warranty for this information). If you have questions about a medical condition or this instruction, always ask your healthcare professional. [place] any warranty or liability for your use of this information.           Starting a Weight Loss Plan: Care Instructions  Your Care Instructions     If you are thinking about losing weight, it can be hard to know where to start. Your doctor can help you set up a weight loss plan that best meets your needs. You may want to take a class on nutrition or exercise, or join a weight loss support group. If you have questions about how to make changes to your eating or exercise habits, ask your doctor about seeing a registered dietitian or an exercise specialist.  It can be a big challenge to lose weight. But you do not have to make huge  changes at once. Make small changes, and stick with them. When those changes become habit, add a few more changes.  If you do not think you are ready to make changes right now, try to pick a date in the future. Make an appointment to see your doctor to discuss whether the time is right for you to start a plan.  Follow-up care is a key part of your treatment and safety. Be sure to make and go to all appointments, and call your doctor if you  are having problems. It's also a good idea to know your test results and keep a list of the medicines you take.  How can you care for yourself at home?  ?? Set realistic goals. Many people expect to lose much more weight than is likely. A weight loss of 5% to 10% of your body weight may be enough to improve your health.  ?? Get family and friends involved to provide support. Talk to them about why you are trying to lose weight, and ask them to help. They can help by participating in exercise and having meals with you, even if they may be eating something different.  ?? Find what works best for you. If you do not have time or do not like to cook, a program that offers meal replacement bars or shakes may be better for you. Or if you like to prepare meals, finding a plan that includes daily menus and recipes may be best.  ?? Ask your doctor about other health professionals who can help you achieve your weight loss goals.  ? A dietitian can help you make healthy changes in your diet.  ? An exercise specialist or personal trainer can help you develop a safe and effective exercise program.  ? A counselor or psychiatrist can help you cope with issues such as depression, anxiety, or family problems that can make it hard to focus on weight loss.  ?? Consider joining a support group for people who are trying to lose weight. Your doctor can suggest groups in your area.  Where can you learn more?  Go to [employer]   Enter U357 in the search box to learn more about "Starting a Weight Loss Plan: Care Instructions."  Current as of: January 03, 2018??????????????????????????????Content Version: 12.6  ?? 2006-2020 [employer].   Care instructions adapted under license by [facility] (which disclaims liability or warranty for this information). If you have questions about a medical condition or this instruction, always ask your healthcare professional. [employer] disclaims any warranty or liability for your use of this information.           Learning About Sleeping Well  What does sleeping well mean?     Sleeping well means getting enough sleep. How much sleep is enough varies among people.  The number of hours you sleep is not as important as how you feel when you wake up. If you do not feel refreshed, you probably need more sleep. Another sign of not getting enough sleep is feeling tired during the day.  The average total nightly sleep time is 7?? to 8 hours. Healthy adults may need a little more or a little less than this.  Why is getting enough sleep important?  Getting enough quality sleep is a basic part of good health. When your sleep suffers, your mood and your thoughts can suffer too. You may find yourself feeling more grumpy or stressed. Not getting enough sleep also can lead to serious problems, including injury, accidents, anxiety, and depression.  What might cause poor sleeping?  Many things can cause sleep problems, including:  ?? Stress. Stress can be caused by fear about a single event, such as giving a speech. Or you may have ongoing stress, such as worry about work or school.  ?? Depression, anxiety, and other mental or emotional conditions.  ?? Changes in your sleep habits or surroundings. This includes changes that happen where you sleep, such as noise, light, or sleeping in a different bed.  It also includes changes in your sleep pattern, such as having jet lag or working a late shift.   ?? Health problems, such as pain, breathing problems, and restless legs syndrome.  ?? Lack of regular exercise.  How can you help yourself?  Here are some tips that may help you sleep more soundly and wake up feeling more refreshed.  Your sleeping area   ?? Use your bedroom only for sleeping and sex. A bit of light reading may help you fall asleep. But if it doesn't, do your reading elsewhere in the house. Don't watch TV in bed.  ?? Be sure your bed is big enough to stretch out comfortably, especially if you have a sleep partner.  ?? Keep your bedroom quiet, dark, and cool. Use curtains, blinds, or a sleep mask to block out light. To block out noise, use earplugs, soothing music, or a "white noise" machine.  Your evening and bedtime routine   ?? Create a relaxing bedtime routine. You might want to take a warm shower or bath, listen to soothing music, or drink a cup of noncaffeinated tea.  ?? Go to bed at the same time every night. And get up at the same time every morning, even if you feel tired.  What to avoid   ?? Limit caffeine (coffee, tea, caffeinated sodas) during the day, and don't have any for at least 4 to 6 hours before bedtime.  ?? Don't drink alcohol before bedtime. Alcohol can cause you to wake up more often during the night.  ?? Don't smoke or use tobacco, especially in the evening. Nicotine can keep you awake.  ?? Don't take naps during the day, especially close to bedtime.  ?? Don't lie in bed awake for too long. If you can't fall asleep, or if you wake up in the middle of the night and can't get back to sleep within 15 minutes or so, get out of bed and go to another room until you feel sleepy.  ?? Don't take medicine right before bed that may keep you awake or make you feel hyper or energized. Your doctor can tell you if your medicine may do this and if you can take it earlier in the day.  If you can't sleep   ?? Imagine yourself in a peaceful, pleasant scene. Focus on the details and  feelings of being in a place that is relaxing.  ?? Get up and do a quiet or boring activity until you feel sleepy.  ?? Don't drink any liquids after 6 p.m. if you wake up often because you have to go to the bathroom.  Where can you learn more?  Go to http://[facility]-rivera.info/  Enter [phone] in the search box to learn more about "Learning About Sleeping Well."  Current as of: February 23, 2018??????????????????????????????Content Version: 12.6  ?? 2006-2020 [employer].   Care instructions adapted under license by [facility] (which disclaims liability or warranty for this information). If you have questions about a medical condition or this instruction, always ask your healthcare professional. [place] any warranty or liability for your use of this information.

## 2018-12-11 NOTE — Progress Notes (Signed)
[facility]  [address], [address]  [place], [place] [address]  Phone: [phone]  Fax: [phone]    Pt's date of birth: 10-18-1970    ASSESSMENT   Diagnoses and all orders for this visit:    1. Lumbar radiculopathy  -     pregabalin (Lyrica) 150 mg capsule; Take 1 Cap by mouth three (3) times daily. Max Daily Amount: 450 mg.    2. Lumbar facet arthropathy  -     celecoxib (CeleBREX) 200 mg capsule; Take 1 cap by mouth daily as needed for pain with meals.    3. Muscle spasm  -     cyclobenzaprine (FLEXERIL) 10 mg tablet; Take 1 tab by mouth BID as needed for muscle spasm    4. Chronic pain of right knee    5. Tobacco use    6. Sleep apnea, unspecified type    7. Class 2 severe obesity with serious comorbidity and body mass index (BMI) of 37.0 to 37.9 in adult, unspecified obesity type [facility])         IMPRESSION AND PLAN:  Jim Shaw is a 48 y.o. male with history of cervical and lumbar pain. He complains of increased pain in the lower back, L>R, and continued pain in the right knee. Pt increased his Lyrica 75 mg to 150 mg 1 cap TID with improvement in his leg pain but he notes continued numbness in the right hand.     1) Pt was given information on cervical and lumbar arthritis exercises.   2) He will restart Lyrica 150 mg 1 cap TID as directed.  3) Pt will also restart Celebrex 200 mg 1 cap daily.   4) He will increase his Flexeril 10 mg to 1 tab BID prn muscle spasm.  5) I encouraged the patient to continue losing weight and he was given information on weight loss.  6) I recommended the patient ambulate with the assistance of a cane.  7) Pt was given information on sleep hygiene.   8) Mr. Ibe has a reminder for a "due or due soon" health maintenance. I have asked that he contact his primary care provider, [name], MD, for follow-up on this health maintenance.  9) PMP demonstrated consistency with prescribing.   10) Smoking cessation recommended and discussed  Follow-up and Dispositions    ?? Return in about 3 months (around 03/13/2019) for Medication follow up.             HISTORY OF PRESENT ILLNESS:  Jim Shaw is a 48 y.o. male with history of cervical and lumbar pain and presents to the office today for follow up. He complains of increased pain in the lower back, L>R, and continued pain in the right knee. Pt notes that he has a cane at home but admits that he does not use it regularly. Of note, he has been followed by Dr. Liliane Channel and Dr. Andrey Campanile. He increased his Lyrica 75 mg to 150 mg and takes 1 cap TID. Pt also takes Flexeril 10 mg as needed and he decreased his Celebrex 200 mg to 1 cap daily. He admits that he ran out of his medications about 2 weeks ago. Pt followed up with his PCP, [name], MD, and was prescribed oxybutynin to take until he could follow up with me. He admits to improvement in his leg pain when taking the Lyrica but notes continued numbness in the right hand. Pt admits  to a decrease in appetite since his last office visit which he attributes to his pain. He has lost 11 lbs. since his last office visit. He reports a history of sleep apnea and admits to difficulty with sleep. Pt at this time desires to proceed with medication evaluation.    Pt notes that he will be moving to [place] at the beginning of 2021. He is a smoker.     Pain Scale: /10    PCP: [name], MD     Past Medical History:   Diagnosis Date   ??? Anxiety    ??? Costochondritis    ??? Depression    ??? Major depressive disorder 11/08/2017   ??? Psychiatric disorder    ??? PTSD (post-traumatic stress disorder)         Social History     Socioeconomic History   ??? Marital status: SINGLE     Spouse name: Not on file   ??? Number of children: Not on file   ??? Years of education: Not on file   ??? Highest education level: Not on file   Occupational History   ??? Not on file   Social Needs    ??? Financial resource strain: Not on file   ??? Food insecurity     Worry: Not on file     Inability: Not on file   ??? Transportation needs     Medical: Not on file     Non-medical: Not on file   Tobacco Use   ??? Smoking status: Current Every Day Smoker     Packs/day: 1.00     Years: 20.00     Pack years: 20.00   ??? Smokeless tobacco: Never Used   ??? Tobacco comment: currently increased stress & increased smoking   Substance and Sexual Activity   ??? Alcohol use: Yes     Comment: 5-6 shots/day   ??? Drug use: Yes     Types: Marijuana   ??? Sexual activity: Not on file   Lifestyle   ??? Physical activity     Days per week: Not on file     Minutes per session: Not on file   ??? Stress: Not on file   Relationships   ??? Social Wellsite geologist on phone: Not on file     Gets together: Not on file     Attends religious service: Not on file     Active member of club or organization: Not on file     Attends meetings of clubs or organizations: Not on file     Relationship status: Not on file   ??? Intimate partner violence     Fear of current or ex partner: Not on file     Emotionally abused: Not on file     Physically abused: Not on file     Forced sexual activity: Not on file   Other Topics Concern   ??? Military Service Not Asked   ??? Blood Transfusions Not Asked   ??? Caffeine Concern Not Asked   ??? Occupational Exposure Not Asked   ??? Hobby Hazards Not Asked   ??? Sleep Concern Not Asked   ??? Stress Concern Not Asked   ??? Weight Concern Not Asked   ??? Special Diet Not Asked   ??? Back Care Not Asked   ??? Exercise Not Asked   ??? Bike Helmet Not Asked   ??? Seat Belt Not Asked   ??? Self-Exams Not Asked  Social History Narrative   ??? Not on file       Current Outpatient Medications   Medication Sig Dispense Refill   ??? pregabalin (Lyrica) 150 mg capsule Take 1 Cap by mouth three (3) times daily. Max Daily Amount: 450 mg. 270 Cap 1   ??? cyclobenzaprine (FLEXERIL) 10 mg tablet Take 1 tab by mouth BID as needed for muscle spasm 180 Tab 1    ??? celecoxib (CeleBREX) 200 mg capsule Take 1 cap by mouth daily as needed for pain with meals. 90 Cap 1   ??? omeprazole (PRILOSEC) 20 mg capsule Take 20 mg by mouth daily.     ??? eszopiclone (LUNESTA) 3 mg tablet Take  by mouth nightly.     ??? DULoxetine (CYMBALTA) 60 mg capsule TK ONE C PO D     ??? chlorzoxazone (Parafon Forte DSC) 500 mg tablet Take 1 tab by mouth BID-TID as needed for muscle spasm. 60 Tab 1   ??? pregabalin (Lyrica) 75 mg capsule Take 1-2 caps by mouth two (2) times daily as directed. Max Daily Amount: 300 mg.  Indications: disorder characterized by stiff, tender & painful muscles 120 Cap 1   ??? diazePAM (VALIUM) 10 mg tablet TK 1 T PO  QHS     ??? ramelteon (ROZEREM) 8 mg tablet TK 1 T PO HS     ??? risperiDONE (RisperDAL) 1 mg tablet TK 2 TS PO HS     ??? tiZANidine (ZANAFLEX) 4 mg tablet Take 1 tab by mouth BID-TID as needed for muscle spasm. 60 Tab 1   ??? triamcinolone acetonide (KENALOG) 0.1 % ointment      ??? fluticasone propionate (FLONASE) 50 mcg/actuation nasal spray 2 Sprays by Both Nostrils route daily.     ??? ciclopirox (PENLAC) 8 % solution Apply  to affected area nightly.     ??? pregabalin (LYRICA) 100 mg capsule Take 1 Cap by mouth three (3) times daily. Max Daily Amount: 300 mg. 90 Cap 1   ??? hydrOXYzine HCl (ATARAX) 50 mg tablet Take 50 mg by mouth as needed for Anxiety.     ??? prazosin (MINIPRESS) 1 mg capsule TK 1 C PO QHS     ??? lamoTRIgine (LaMICtal) 25 mg tablet Take 100 mg by mouth nightly.     ??? mirtazapine (REMERON) 15 mg tablet TK 1 T PO HS     ??? celecoxib (CELEBREX) 100 mg capsule Take 1 Cap by mouth daily.     ??? tiZANidine (ZANAFLEX) 4 mg tablet Take 1 Tab by mouth three (3) times daily. 90 Tab 1   ??? gabapentin (NEURONTIN) 600 mg tablet Take 600 mg by mouth three (3) times daily.     ??? venlafaxine-SR (EFFEXOR-XR) 150 mg capsule Take 1 Cap by mouth daily (with breakfast). Indications: Anxiousness associated with Depression 15 Cap 1    ??? QUEtiapine (SEROQUEL) 50 mg tablet Take 1 Tab by mouth nightly. Indications: Additional Medications to Treat Depression (Patient not taking: Reported on 05/23/2018) 1 Tab 0       Allergies   Allergen Reactions   ??? Oxycodone-Acetaminophen Anaphylaxis and Swelling     Pt denies allergy   ??? Duloxetine Hcl Rash   ??? Aspirin Other (comments)   ??? Cortisone Nausea and Vomiting     Also rash    ??? Ibuprofen Rash         REVIEW OF SYSTEMS    Constitutional: Negative for fever or chills. Positive for 11 lb weight loss.  Respiratory: Negative for cough or shortness of breath.     Cardiovascular: Negative for chest pain or palpitations.  Gastrointestinal: Negative for acid reflux, change in bowel habits, or constipation.  Genitourinary: Negative for dysuria and flank pain.   Musculoskeletal: Positive for cervical, right knee, and lumbar pain  Neurological: Negative for headaches or dizziness. Positive for numbness.  Endo/Heme/Allergies: Negative for increased bruising.   Psychiatric/Behavioral: Positive for difficulty with sleep.    As per HPI    PHYSICAL EXAMINATION  Visit Vitals  BP (!) 141/85 (BP 1 Location: Right arm, BP Patient Position: Sitting)   Pulse 72   Temp 99 ??F (37.2 ??C) (Skin)   Resp 16   Ht 6' (1.829 m)   Wt 279 lb (126.6 kg)   SpO2 98%   BMI 37.84 kg/m??       Constitutional: Awake, alert, and in no acute distress.  Neurological: 1+ symmetrical DTRs in the upper extremities. 1+ symmetrical DTRs in the lower extremities. Sensation to light touch is intact. Negative Hoffman's sign bilaterally.  Skin: warm, dry, and intact.   Musculoskeletal: Decreased range of motion with side to side cervical flexon. Tight across the upper trapezius bilaterally. Tenderness to palpation in the lower lumbar region. Moderate pain with extension and axial loading. No pain with internal or external rotation of his hips. Positive straight leg raise on the left. Pt presents to the office today wearing a hinged knee support.        Biceps  Triceps Deltoids Wrist Ext Wrist Flex Hand Intrin   Right +4/5 +4/5 +4/5 +4/5 +4/5 +4/5   Left +4/5 +4/5 +4/5 +4/5 +4/5 +4/5      Hip Flex  Quads Hamstrings Ankle DF EHL Ankle PF   Right +4/5 +4/5 +4/5 +4/5 +4/5 +4/5   Left +4/5 +4/5 +4/5 +4/5 +4/5 +4/5     IMAGING:    Hip and??knee x-rays from 04/10/2018 were??personally reviewed with the patient and demonstrated:  Minimal??degenerative??changes.??  ??  Cervical MRI from 10/13/2017 was personally reviewed with the patient and demonstrated:  ??   Interface, Powers[facility] Rad Res - 10/13/2017 ??4:00 PM EDT  EXAM: MRI of cervical spine without contrast    CLINICAL INDICATION/HISTORY: M54.41: Low back pain with right-sided sciatica    COMPARISON: None    TECHNIQUE: Multiplanar multi-sequential imaging of the cervical spine without contrast. T1W, T2W and STIR sequences were obtained in the sagittal plane. T2W and GRE sequences were obtained in the axial plane.    FINDINGS:     Postoperative changes: None.    Alignment: Overall straightening of normal cervical lordosis. No focal listhesis.    Vertebral body heights: Preserved. No edema.  -Mild intervertebral disc space narrowing at C5-6 and C4-5. Slight disc bulge at several levels.    Marrow signal: No edema.    Cord: Normal caliber. No syrinx or edema.    Cerebellar tonsils: No Chiari 1 malformation.      Correlation of axial and sagittal data through the disc levels:    -C2-3: No significant canal or foraminal stenosis    -C3-4: Mild broad-based posterior disc bulge effaces fluid space and contributes to mild central canal narrowing to 10 mm. No significant foraminal narrowing.    -C4-5: Minimal broad-based posterior disc bulge. No significant canal narrowing.   -Mild to moderate right foraminal stenosis due to asymmetric disc bulge and some uncovertebral spur.    -C5-6: Broad-based posterior disc bulge effaces fluid space and may contact the left ventral cervical cord. No  edema. Mild central canal  narrowing to 9.5 mm.   -There is mild left lateral recess narrowing and mild to borderline moderate left foraminal narrowing due to disc bulge and uncovertebral spur.    -C6-7: No significant canal or foraminal stenosis.    -C7-T1: No significant canal or foraminal stenosis.      IMPRESSION  1. Straightened cervical lordosis. No compression deformity, edema or listhesis.    2. Multilevel degenerative discogenic disease with mild degree of canal narrowing at several levels.  -Ventral cord contact and minimal distortion due to disc bulge C5-C6 as above.    3. Varying degrees of bilateral foraminal narrowing most significant right C4-5 and left C5-6.    Signed By: Emilia Beck, MD on 10/13/2017 3:58 PM   ??  ??  ??  Lumbar MRI from 10/13/2017 was personally reviewed with the patient and demonstrated:  ??   Interface, [facility] Rad Res - 10/13/2017 ??4:37 PM EDT  EXAM: MR LUMBAR SPINE WITHOUT CONTRAST    CLINICAL INDICATION/HISTORY: ??M54.41: Low back pain with right-sided sciatica    COMPARISON: None    TECHNIQUE: Multiplanar multi-sequential imaging of the lumbar spine without contrast. T1W, T2W and STIR sequences were obtained in the sagittal plane. Stacked axial T2 and axial T1W through the lower 3 disc levels.    FINDINGS:     Postoperative : None.    Vertebral alignment: Mild straightening of lumbar lordosis with trace retrolisthesis of L5. No spondylolysis.    Vertebral body heights: Normal. Mild endplate concavity in L5 and L4.    Marrow signal: Normal.    Cord: Conus medullaris terminates at the L1-L2 level with normal signal.    Soft tissues: Normal.    Correlation of axial and sagittal data through the disc levels:    -L1-2: No significant disc pathology. No spinal canal or foraminal stenosis.    -L2-3: No significant disc pathology. Mild facet and ligamentous hypertrophy with bilateral facet joint effusion. No central or foraminal stenosis     -L3-4: No significant disc pathology. Similarly, mild bilateral facet and ligamentous hypertrophy with small facet joint effusion. No central or foraminal stenosis.    -L4-5: Mild posterior disc bulge. No central stenosis. More severe facet and ligamentous hypertrophy with mild facet inflammation. No significant foraminal stenosis    -L5-S1: Disc bulge most prominent. Still no severe central stenosis. Facet and ligamentous hypertrophy with inflammation. Moderate bilateral foraminal stenosis. Mild compression of bilateral exiting L5 nerve root suspected.    IMPRESSION  Posterior disc bulge and facet/ligamentum flavum hypertrophy most prominent at L5-S1. Moderate bilateral foraminal stenosis with suspected mild compression bilateral exiting L5 nerve roots. Facet hypertrophy and inflammation throughout.    Signed By: Gretel Acre, MD on 10/13/2017 4:35 PM   ??  ??  Right upper extremity EMG from 12/27/2017 was personally reviewed with the patient and demonstrated:  NCV & EMG Findings:  All nerve conduction studies (as indicated in the following tables) were within normal limits. ??  ??  All examined muscles (as indicated in the following table) showed no evidence of electrical instability.????  ??  INTERPRETATION  This was a normal nerve conduction and EMG study showing there to be no signs of neuropathy, myopathy, or radiculopathy in the nerves and muscles tested.??  ??  CLINICAL INTERPRETATION  There are no electrodiagnostic findings correlating with his symptoms.??    Written by Robinette Haines, [place], as dictated by Delrae Sawyers, MD.  I, Dr. Delrae Sawyers confirm that all documentation  is accurate.

## 2019-02-16 ENCOUNTER — Emergency Department: Admit: 2019-02-16 | Payer: MEDICAID | Primary: Family Medicine

## 2019-02-16 ENCOUNTER — Inpatient Hospital Stay
Admit: 2019-02-16 | Discharge: 2019-02-16 | Disposition: A | Discharge status: Home / Self Care | Payer: MEDICAID | Attending: Emergency Medicine

## 2019-02-16 DIAGNOSIS — R1012 Left upper quadrant pain: Secondary | ICD-10-CM

## 2019-02-16 LAB — METABOLIC PANEL, COMPREHENSIVE
A-G Ratio: 0.9 (ref 0.8–1.7)
ALT (SGPT): 21 U/L (ref 16–61)
AST (SGOT): 14 U/L (ref 10–38)
Albumin: 3.6 g/dL (ref 3.4–5.0)
Alk. phosphatase: 78 U/L (ref 45–117)
Anion gap: 7 mmol/L (ref 3.0–18)
BUN/Creatinine ratio: 9 — ABNORMAL LOW (ref 12–20)
BUN: 11 MG/DL (ref 7.0–18)
Bilirubin, total: 0.5 MG/DL (ref 0.2–1.0)
CO2: 28 mmol/L (ref 21–32)
Calcium: 8.7 MG/DL (ref 8.5–10.1)
Chloride: 110 mmol/L (ref 100–111)
Creatinine: 1.19 MG/DL (ref 0.6–1.3)
GFR est AA: >60 mL/min/{1.73_m2} (ref >60)
GFR est non-AA: >60 mL/min/{1.73_m2} (ref >60)
Globulin: 3.9 g/dL (ref 2.0–4.0)
Glucose: 91 mg/dL (ref 74–99)
Potassium: 3.9 mmol/L (ref 3.5–5.5)
Protein, total: 7.5 g/dL (ref 6.4–8.2)
Sodium: 145 mmol/L (ref 136–145)

## 2019-02-16 LAB — CBC WITH AUTOMATED DIFF
ABS. BASOPHILS: 0.1 10*3/uL — ABNORMAL HIGH (ref 0.0–0.06)
ABS. EOSINOPHILS: 0.2 10*3/uL (ref 0.0–0.4)
ABS. LYMPHOCYTES: 4.5 10*3/uL — ABNORMAL HIGH (ref 0.8–3.5)
ABS. MONOCYTES: 0.5 10*3/uL (ref 0–1.0)
ABS. NEUTROPHILS: 4.1 10*3/uL (ref 1.8–8.0)
BASOPHILS: 1 % (ref 0–3)
EOSINOPHILS: 2 % (ref 0–5)
HCT: 44.8 % (ref 36.0–48.0)
HGB: 14.9 g/dL (ref 13.0–16.0)
LYMPHOCYTES: 48 % (ref 20–51)
MCH: 29.3 PG (ref 24.0–34.0)
MCHC: 33.3 g/dL (ref 31.0–37.0)
MCV: 88.2 FL (ref 74.0–97.0)
MONOCYTES: 5 % (ref 2–9)
MPV: 10 FL (ref 9.2–11.8)
NEUTROPHILS: 44 % (ref 42–75)
PLATELET COMMENTS: ADEQUATE
PLATELET: 247 10*3/uL (ref 135–420)
RBC: 5.08 M/uL (ref 4.70–5.50)
RDW: 15.7 % — ABNORMAL HIGH (ref 11.6–14.5)
WBC: 9.4 10*3/uL (ref 4.6–13.2)

## 2019-02-16 LAB — LIPASE
Lipase: 63 U/L — ABNORMAL LOW (ref 73–393)
Lipase: 63 U/L — ABNORMAL LOW (ref 73–393)

## 2019-02-16 LAB — COMPREHENSIVE METABOLIC PANEL
ALT: 21 U/L (ref 16–61)
AST: 14 U/L (ref 10–38)
Albumin/Globulin Ratio: 0.9 (ref 0.8–1.7)
Albumin: 3.6 g/dL (ref 3.4–5.0)
Alkaline Phosphatase: 78 U/L (ref 45–117)
Anion Gap: 7 mmol/L (ref 3.0–18)
BUN: 11 MG/DL (ref 7.0–18)
Bun/Cre Ratio: 9 — ABNORMAL LOW (ref 12–20)
CO2: 28 mmol/L (ref 21–32)
Calcium: 8.7 MG/DL (ref 8.5–10.1)
Chloride: 110 mmol/L (ref 100–111)
Creatinine: 1.19 MG/DL (ref 0.6–1.3)
EGFR IF NonAfrican American: >60 mL/min/{1.73_m2} (ref >60)
GFR African American: >60 mL/min/{1.73_m2} (ref >60)
Globulin: 3.9 g/dL (ref 2.0–4.0)
Glucose: 91 mg/dL (ref 74–99)
Potassium: 3.9 mmol/L (ref 3.5–5.5)
Sodium: 145 mmol/L (ref 136–145)
Total Bilirubin: 0.5 MG/DL (ref 0.2–1.0)
Total Protein: 7.5 g/dL (ref 6.4–8.2)

## 2019-02-16 LAB — CBC WITH AUTO DIFFERENTIAL
Basophils %: 1 % (ref 0–3)
Basophils Absolute: 0.1 10*3/uL — ABNORMAL HIGH (ref 0.0–0.06)
Eosinophils %: 2 % (ref 0–5)
Eosinophils Absolute: 0.2 10*3/uL (ref 0.0–0.4)
Hematocrit: 44.8 % (ref 36.0–48.0)
Hemoglobin: 14.9 g/dL (ref 13.0–16.0)
Lymphocytes %: 48 % (ref 20–51)
Lymphocytes Absolute: 4.5 10*3/uL — ABNORMAL HIGH (ref 0.8–3.5)
MCH: 29.3 PG (ref 24.0–34.0)
MCHC: 33.3 g/dL (ref 31.0–37.0)
MCV: 88.2 FL (ref 74.0–97.0)
MPV: 10 FL (ref 9.2–11.8)
Monocytes %: 5 % (ref 2–9)
Monocytes Absolute: 0.5 10*3/uL (ref 0–1.0)
Neutrophils %: 44 % (ref 42–75)
Neutrophils Absolute: 4.1 10*3/uL (ref 1.8–8.0)
Platelet Comment: ADEQUATE
Platelets: 247 10*3/uL (ref 135–420)
RBC: 5.08 M/uL (ref 4.70–5.50)
RDW: 15.7 % — ABNORMAL HIGH (ref 11.6–14.5)
WBC: 9.4 10*3/uL (ref 4.6–13.2)

## 2019-02-16 MED ORDER — SODIUM CHLORIDE 0.9% BOLUS IV
0.9 % | Freq: Once | INTRAVENOUS | Status: AC
Start: 2019-02-16 — End: 2019-02-16
  Administered 2019-02-16: 10:00:00 via INTRAVENOUS

## 2019-02-16 MED ORDER — IOPAMIDOL 61 % IV SOLN
61 % | Freq: Once | INTRAVENOUS | Status: AC
Start: 2019-02-16 — End: 2019-02-16
  Administered 2019-02-16: 09:00:00 via INTRAVENOUS

## 2019-02-16 MED FILL — SODIUM CHLORIDE 0.9 % IV: INTRAVENOUS | Qty: 1000

## 2019-02-16 MED FILL — ISOVUE-300  61 % INTRAVENOUS SOLUTION: 300 mg iodine /mL (61 %) | INTRAVENOUS | Qty: 100

## 2019-02-16 NOTE — ED Provider Notes (Signed)
EMERGENCY DEPARTMENT HISTORY AND PHYSICAL EXAM  This was created with voice recognition software and transcription errors may be present.     2:50 AM  Date: 02/16/2019  Patient Name: Jim Shaw [facility]    History of Presenting Illness     Chief Complaint:    History Provided By:     HPI: Jim Shaw is a 49 y.o. male past medical history of anxiety depression PTSD who presents with abdominal pain.  Patient states is been going on for approximately 1 month primarily left upper quadrant.  Last night was associated with radiation to the groin lasted for a few minutes and resolved without intervention.  Patient notes increased thirst she is drinking frequently including water and green tea as well as not sleeping at night.  No fever or chills.  No diarrhea.  No other aggravating or alleviating factors patient does state that he is not sleeping well at night secondary to 3 hours waking up sometimes the pills is drenched in sweat.      PCP: [name], MD      Past History     Past Medical History:  Past Medical History:   Diagnosis Date   ??? Anxiety    ??? Costochondritis    ??? Depression    ??? Major depressive disorder 11/08/2017   ??? Psychiatric disorder    ??? PTSD (post-traumatic stress disorder)        Past Surgical History:  Past Surgical History:   Procedure Laterality Date   ??? HX ORTHOPAEDIC      Surgery on knee (right knee)        Family History:  Family History   Problem Relation Age of Onset   ??? No Known Problems Mother    ??? No Known Problems Father        Social History:  Social History     Tobacco Use   ??? Smoking status: Current Every Day Smoker     Packs/day: 1.00     Years: 20.00     Pack years: 20.00   ??? Smokeless tobacco: Never Used   ??? Tobacco comment: currently increased stress & increased smoking   Substance Use Topics   ??? Alcohol use: Yes     Comment: 5-6 shots/day   ??? Drug use: Yes     Types: Marijuana       Allergies:  Allergies   Allergen Reactions   ???  Oxycodone-Acetaminophen Anaphylaxis and Swelling     Pt denies allergy   ??? Duloxetine Hcl Rash   ??? Aspirin Other (comments)   ??? Cortisone Nausea and Vomiting     Also rash    ??? Ibuprofen Rash       Review of Systems     Review of Systems   All other systems reviewed and are negative.    10 point review of systems otherwise negative unless noted in HPI.    Physical Exam       Physical Exam  Constitutional:       Appearance: He is well-developed.   HENT:      Head: Normocephalic and atraumatic.   Eyes:      Pupils: Pupils are equal, round, and reactive to light.   Neck:      Musculoskeletal: Normal range of motion and neck supple.   Cardiovascular:      Rate and Rhythm: Normal rate and regular rhythm.      Heart sounds: Normal heart sounds. No murmur. No friction  rub.   Pulmonary:      Effort: Pulmonary effort is normal. No respiratory distress.      Breath sounds: Normal breath sounds. No wheezing.   Abdominal:      General: There is no distension.      Palpations: Abdomen is soft.      Tenderness: There is no abdominal tenderness. There is no guarding or rebound.   Musculoskeletal: Normal range of motion.   Skin:     General: Skin is warm and dry.   Neurological:      Mental Status: He is alert and oriented to person, place, and time.   Psychiatric:         Behavior: Behavior normal.         Thought Content: Thought content normal.         Diagnostic Study Results     Vital Signs   Visit Vitals  BP 138/64 (BP 1 Location: Left arm, BP Patient Position: At rest)   Pulse 61   Temp 98.5 ??F (36.9 ??C)   Resp 16   SpO2 96%      EKG: KG shows sinus at 52 normal axis normal intervals there is no ST elevation or depression no hypertrophy  Labs: CBC unremarkable Mr. Unremarkable  Imaging:   IMPRESSION  ??  Cyst coli. No evidence of acute inflammation.  ??  Report provided to the emergency department at 0434 hrs.  Medical Decision Making     ED Course: Progress Notes, Reevaluation, and Consults:      Provider Notes (Medical  Decision Making): This 49 year old gentleman presents with abdominal pain x3 to 4 weeks.  Not worse with eating.  Pain is essentially in the left upper quadrant predominantly.  Was one episode of radiation to the groin.  No current testicle tenderness.  He is currently having some nausea but no vomiting.  Will check basic labs CT abdomen pelvis and reassess.      Clear etiology of the patient's pain will discharge for outpatient follow-up         Diagnosis     Clinical Impression: No diagnosis found.    Disposition:    Patient's Medications   Start Taking    No medications on file   Continue Taking    CELECOXIB (CELEBREX) 100 MG CAPSULE    Take 1 Cap by mouth daily.    CELECOXIB (CELEBREX) 200 MG CAPSULE    Take 1 cap by mouth daily as needed for pain with meals.    CHLORZOXAZONE (PARAFON FORTE DSC) 500 MG TABLET    Take 1 tab by mouth BID-TID as needed for muscle spasm.    CICLOPIROX (PENLAC) 8 % SOLUTION    Apply  to affected area nightly.    CYCLOBENZAPRINE (FLEXERIL) 10 MG TABLET    Take 1 tab by mouth BID as needed for muscle spasm    DIAZEPAM (VALIUM) 10 MG TABLET    TK 1 T PO  QHS    DULOXETINE (CYMBALTA) 60 MG CAPSULE    TK ONE C PO D    ESZOPICLONE (LUNESTA) 3 MG TABLET    Take  by mouth nightly.    FLUTICASONE PROPIONATE (FLONASE) 50 MCG/ACTUATION NASAL SPRAY    2 Sprays by Both Nostrils route daily.    GABAPENTIN (NEURONTIN) 600 MG TABLET    Take 600 mg by mouth three (3) times daily.    HYDROXYZINE HCL (ATARAX) 50 MG TABLET    Take 50 mg by mouth as needed  for Anxiety.    LAMOTRIGINE (LAMICTAL) 25 MG TABLET    Take 100 mg by mouth nightly.    MIRTAZAPINE (REMERON) 15 MG TABLET    TK 1 T PO HS    OMEPRAZOLE (PRILOSEC) 20 MG CAPSULE    Take 20 mg by mouth daily.    PRAZOSIN (MINIPRESS) 1 MG CAPSULE    TK 1 C PO QHS    PREGABALIN (LYRICA) 100 MG CAPSULE    Take 1 Cap by mouth three (3) times daily. Max Daily Amount: 300 mg.    PREGABALIN (LYRICA) 150 MG CAPSULE    Take 1 Cap by mouth three (3) times daily.  Max Daily Amount: 450 mg.    PREGABALIN (LYRICA) 75 MG CAPSULE    Take 1-2 caps by mouth two (2) times daily as directed. Max Daily Amount: 300 mg.  Indications: disorder characterized by stiff, tender & painful muscles    QUETIAPINE (SEROQUEL) 50 MG TABLET    Take 1 Tab by mouth nightly. Indications: Additional Medications to Treat Depression    RAMELTEON (ROZEREM) 8 MG TABLET    TK 1 T PO HS    RISPERIDONE (RISPERDAL) 1 MG TABLET    TK 2 TS PO HS    TIZANIDINE (ZANAFLEX) 4 MG TABLET    Take 1 Tab by mouth three (3) times daily.    TIZANIDINE (ZANAFLEX) 4 MG TABLET    Take 1 tab by mouth BID-TID as needed for muscle spasm.    TRIAMCINOLONE ACETONIDE (KENALOG) 0.1 % OINTMENT        VENLAFAXINE-SR (EFFEXOR-XR) 150 MG CAPSULE    Take 1 Cap by mouth daily (with breakfast). Indications: Anxiousness associated with Depression   These Medications have changed    No medications on file   Stop Taking    No medications on file

## 2019-02-16 NOTE — ED Notes (Signed)
Patient with abd pain x3 weeks.

## 2019-02-16 NOTE — ED Provider Notes (Signed)
EMERGENCY DEPARTMENT HISTORY AND PHYSICAL EXAM  This was created with voice recognition software and transcription errors may be present.     2:50 AM  Date: 02/16/2019  Patient Name: Jim Shaw [facility]    History of Presenting Illness     Chief Complaint:    History Provided By:     HPI: [name] is a 49 y.o. male past medical history of anxiety depression PTSD who presents with abdominal pain.  Patient states is been going on for approximately 1 month primarily left upper quadrant.  Last night was associated with radiation to the groin lasted for a few minutes and resolved without intervention.  Patient notes increased thirst she is drinking frequently including water and green tea as well as not sleeping at night.  No fever or chills.  No diarrhea.  No other aggravating or alleviating factors patient does state that he is not sleeping well at night secondary to 3 hours waking up sometimes the pills is drenched in sweat.      PCP: [name], MD      Past History     Past Medical History:  Past Medical History:   Diagnosis Date   ??? Anxiety    ??? Costochondritis    ??? Depression    ??? Major depressive disorder 11/08/2017   ??? Psychiatric disorder    ??? PTSD (post-traumatic stress disorder)        Past Surgical History:  Past Surgical History:   Procedure Laterality Date   ??? HX ORTHOPAEDIC      Surgery on knee (right knee)        Family History:  Family History   Problem Relation Age of Onset   ??? No Known Problems Mother    ??? No Known Problems Father        Social History:  Social History     Tobacco Use   ??? Smoking status: Current Every Day Smoker     Packs/day: 1.00     Years: 20.00     Pack years: 20.00   ??? Smokeless tobacco: Never Used   ??? Tobacco comment: currently increased stress & increased smoking   Substance Use Topics   ??? Alcohol use: Yes     Comment: 5-6 shots/day   ??? Drug use: Yes     Types: Marijuana       Allergies:  Allergies   Allergen Reactions    ??? Oxycodone-Acetaminophen Anaphylaxis and Swelling     Pt denies allergy   ??? Duloxetine Hcl Rash   ??? Aspirin Other (comments)   ??? Cortisone Nausea and Vomiting     Also rash    ??? Ibuprofen Rash       Review of Systems     Review of Systems   All other systems reviewed and are negative.    10 point review of systems otherwise negative unless noted in HPI.    Physical Exam       Physical Exam  Constitutional:       Appearance: He is well-developed.   HENT:      Head: Normocephalic and atraumatic.   Eyes:      Pupils: Pupils are equal, round, and reactive to light.   Neck:      Musculoskeletal: Normal range of motion and neck supple.   Cardiovascular:      Rate and Rhythm: Normal rate and regular rhythm.      Heart sounds: Normal heart sounds. No murmur. No friction  rub.   Pulmonary:      Effort: Pulmonary effort is normal. No respiratory distress.      Breath sounds: Normal breath sounds. No wheezing.   Abdominal:      General: There is no distension.      Palpations: Abdomen is soft.      Tenderness: There is no abdominal tenderness. There is no guarding or rebound.   Musculoskeletal: Normal range of motion.   Skin:     General: Skin is warm and dry.   Neurological:      Mental Status: He is alert and oriented to person, place, and time.   Psychiatric:         Behavior: Behavior normal.         Thought Content: Thought content normal.         Diagnostic Study Results     Vital Signs   Visit Vitals  BP 138/64 (BP 1 Location: Left arm, BP Patient Position: At rest)   Pulse 61   Temp 98.5 ??F (36.9 ??C)   Resp 16   SpO2 96%      EKG: KG shows sinus at 52 normal axis normal intervals there is no ST elevation or depression no hypertrophy  Labs: CBC unremarkable Mr. Unremarkable  Imaging:   IMPRESSION  ??  Cyst coli. No evidence of acute inflammation.  ??  Report provided to the emergency department at 0434 hrs.  Medical Decision Making     ED Course: Progress Notes, Reevaluation, and Consults:       Provider Notes (Medical Decision Making): This 49 year old gentleman presents with abdominal pain x3 to 4 weeks.  Not worse with eating.  Pain is essentially in the left upper quadrant predominantly.  Was one episode of radiation to the groin.  No current testicle tenderness.  He is currently having some nausea but no vomiting.  Will check basic labs CT abdomen pelvis and reassess.      Clear etiology of the patient's pain will discharge for outpatient follow-up         Diagnosis     Clinical Impression: No diagnosis found.    Disposition:    Patient's Medications   Start Taking    No medications on file   Continue Taking    CELECOXIB (CELEBREX) 100 MG CAPSULE    Take 1 Cap by mouth daily.    CELECOXIB (CELEBREX) 200 MG CAPSULE    Take 1 cap by mouth daily as needed for pain with meals.    CHLORZOXAZONE (PARAFON FORTE DSC) 500 MG TABLET    Take 1 tab by mouth BID-TID as needed for muscle spasm.    CICLOPIROX (PENLAC) 8 % SOLUTION    Apply  to affected area nightly.    CYCLOBENZAPRINE (FLEXERIL) 10 MG TABLET    Take 1 tab by mouth BID as needed for muscle spasm    DIAZEPAM (VALIUM) 10 MG TABLET    TK 1 T PO  QHS    DULOXETINE (CYMBALTA) 60 MG CAPSULE    TK ONE C PO D    ESZOPICLONE (LUNESTA) 3 MG TABLET    Take  by mouth nightly.    FLUTICASONE PROPIONATE (FLONASE) 50 MCG/ACTUATION NASAL SPRAY    2 Sprays by Both Nostrils route daily.    GABAPENTIN (NEURONTIN) 600 MG TABLET    Take 600 mg by mouth three (3) times daily.    HYDROXYZINE HCL (ATARAX) 50 MG TABLET    Take 50 mg by mouth as needed  for Anxiety.    LAMOTRIGINE (LAMICTAL) 25 MG TABLET    Take 100 mg by mouth nightly.    MIRTAZAPINE (REMERON) 15 MG TABLET    TK 1 T PO HS    OMEPRAZOLE (PRILOSEC) 20 MG CAPSULE    Take 20 mg by mouth daily.    PRAZOSIN (MINIPRESS) 1 MG CAPSULE    TK 1 C PO QHS    PREGABALIN (LYRICA) 100 MG CAPSULE    Take 1 Cap by mouth three (3) times daily. Max Daily Amount: 300 mg.     PREGABALIN (LYRICA) 150 MG CAPSULE    Take 1 Cap by mouth three (3) times daily. Max Daily Amount: 450 mg.    PREGABALIN (LYRICA) 75 MG CAPSULE    Take 1-2 caps by mouth two (2) times daily as directed. Max Daily Amount: 300 mg.  Indications: disorder characterized by stiff, tender & painful muscles    QUETIAPINE (SEROQUEL) 50 MG TABLET    Take 1 Tab by mouth nightly. Indications: Additional Medications to Treat Depression    RAMELTEON (ROZEREM) 8 MG TABLET    TK 1 T PO HS    RISPERIDONE (RISPERDAL) 1 MG TABLET    TK 2 TS PO HS    TIZANIDINE (ZANAFLEX) 4 MG TABLET    Take 1 Tab by mouth three (3) times daily.    TIZANIDINE (ZANAFLEX) 4 MG TABLET    Take 1 tab by mouth BID-TID as needed for muscle spasm.    TRIAMCINOLONE ACETONIDE (KENALOG) 0.1 % OINTMENT        VENLAFAXINE-SR (EFFEXOR-XR) 150 MG CAPSULE    Take 1 Cap by mouth daily (with breakfast). Indications: Anxiousness associated with Depression   These Medications have changed    No medications on file   Stop Taking    No medications on file

## 2019-02-16 NOTE — ED Triage Notes (Signed)
Patient with abd pain x3 weeks.

## 2019-02-18 LAB — EKG, 12 LEAD, INITIAL
Atrial Rate: 52 {beats}/min
Calculated P Axis: 41 degrees
Calculated R Axis: 10 degrees
Calculated T Axis: 27 degrees
P-R Interval: 146 ms
Q-T Interval: 400 ms
QRS Duration: 98 ms
QTC Calculation (Bezet): 372 ms
Ventricular Rate: 52 {beats}/min

## 2019-02-18 LAB — EKG 12-LEAD
Atrial Rate: 52 {beats}/min
P Axis: 41 degrees
P-R Interval: 146 ms
Q-T Interval: 400 ms
QRS Duration: 98 ms
QTc Calculation (Bazett): 372 ms
R Axis: 10 degrees
T Axis: 27 degrees
Ventricular Rate: 52 {beats}/min

## 2019-03-07 ENCOUNTER — Encounter

## 2019-03-12 ENCOUNTER — Encounter: Attending: Family Medicine | Primary: Family Medicine

## 2019-03-12 NOTE — Progress Notes (Deleted)
[name]  [address], [place] [address]  Phone: [phone]  Fax: [phone]    Pt's date of birth: [birth year]    ASSESSMENT   {There are no diagnoses linked to this encounter. (Refresh or delete this SmartLink)}     IMPRESSION AND PLAN:  ***.     1) Pt was given information on *** exercises.   2)   3)   4) Mr. Jim Shaw has a reminder for a "due or due soon" health maintenance. I have asked that he contact his primary care provider, Jim Clock, MD, for follow-up on this health maintenance.  5) PMP demonstrated consistency with prescribing.   6) Last UDS from *** was consistent.          HISTORY OF PRESENT ILLNESS:  Jim Shaw is a 49 y.o. *** male with history of *** pain and presents to the office today for *** follow up. Pt is worse with ***. Pt is better with ***. Pt states he has been using *** with ***relief/no relief (improvement/no improvement). Pt has been prescribed *** and it has *** been effective. Pt at this time desires to *** continue with current care/proceed with medication evaluation/proceed with ***.    Pain Scale: /10    PCP: Jim Clock, MD     Past Medical History:   Diagnosis Date   ??? Anxiety    ??? Costochondritis    ??? Depression    ??? Major depressive disorder 11/08/2017   ??? Psychiatric disorder    ??? PTSD (post-traumatic stress disorder)         Social History     Socioeconomic History   ??? Marital status: SINGLE     Spouse name: Not on file   ??? Number of children: Not on file   ??? Years of education: Not on file   ??? Highest education level: Not on file   Occupational History   ??? Not on file   Social Needs   ??? Financial resource strain: Not on file   ??? Food insecurity     Worry: Not on file     Inability: Not on file   ??? Transportation needs     Medical: Not on file     Non-medical: Not on file   Tobacco Use   ??? Smoking status: Current Every Day Smoker     Packs/day: 1.00     Years: 20.00      Pack years: 20.00   ??? Smokeless tobacco: Never Used   ??? Tobacco comment: currently increased stress & increased smoking   Substance and Sexual Activity   ??? Alcohol use: Yes     Comment: 5-6 shots/day   ??? Drug use: Yes     Types: Marijuana   ??? Sexual activity: Not on file   Lifestyle   ??? Physical activity     Days per week: Not on file     Minutes per session: Not on file   ??? Stress: Not on file   Relationships   ??? Social Product manager on phone: Not on file     Gets together: Not on file     Attends religious service: Not on file     Active member of club or organization: Not on file     Attends meetings of clubs or organizations: Not on file     Relationship status: Not on file   ??? Intimate  partner violence     Fear of current or ex partner: Not on file     Emotionally abused: Not on file     Physically abused: Not on file     Forced sexual activity: Not on file   Other Topics Concern   ??? Military Service Not Asked   ??? Blood Transfusions Not Asked   ??? Caffeine Concern Not Asked   ??? Occupational Exposure Not Asked   ??? Hobby Hazards Not Asked   ??? Sleep Concern Not Asked   ??? Stress Concern Not Asked   ??? Weight Concern Not Asked   ??? Special Diet Not Asked   ??? Back Care Not Asked   ??? Exercise Not Asked   ??? Bike Helmet Not Asked   ??? Seat Belt Not Asked   ??? Self-Exams Not Asked   Social History Narrative   ??? Not on file       Current Outpatient Medications   Medication Sig Dispense Refill   ??? pregabalin (Lyrica) 150 mg capsule Take 1 Cap by mouth three (3) times daily. Max Daily Amount: 450 mg. 270 Cap 1   ??? cyclobenzaprine (FLEXERIL) 10 mg tablet Take 1 tab by mouth BID as needed for muscle spasm 180 Tab 1   ??? celecoxib (CeleBREX) 200 mg capsule Take 1 cap by mouth daily as needed for pain with meals. 90 Cap 1   ??? omeprazole (PRILOSEC) 20 mg capsule Take 20 mg by mouth daily.     ??? eszopiclone (LUNESTA) 3 mg tablet Take  by mouth nightly.     ??? DULoxetine (CYMBALTA) 60 mg capsule TK ONE C PO D     ??? chlorzoxazone  (Parafon Forte DSC) 500 mg tablet Take 1 tab by mouth BID-TID as needed for muscle spasm. 60 Tab 1   ??? pregabalin (Lyrica) 75 mg capsule Take 1-2 caps by mouth two (2) times daily as directed. Max Daily Amount: 300 mg.  Indications: disorder characterized by stiff, tender & painful muscles 120 Cap 1   ??? diazePAM (VALIUM) 10 mg tablet TK 1 T PO  QHS     ??? lamoTRIgine (LaMICtal) 25 mg tablet Take 100 mg by mouth nightly.     ??? ramelteon (ROZEREM) 8 mg tablet TK 1 T PO HS     ??? risperiDONE (RisperDAL) 1 mg tablet TK 2 TS PO HS     ??? tiZANidine (ZANAFLEX) 4 mg tablet Take 1 tab by mouth BID-TID as needed for muscle spasm. 60 Tab 1   ??? mirtazapine (REMERON) 15 mg tablet TK 1 T PO HS     ??? triamcinolone acetonide (KENALOG) 0.1 % ointment      ??? fluticasone propionate (FLONASE) 50 mcg/actuation nasal spray 2 Sprays by Both Nostrils route daily.     ??? ciclopirox (PENLAC) 8 % solution Apply  to affected area nightly.     ??? celecoxib (CELEBREX) 100 mg capsule Take 1 Cap by mouth daily.     ??? tiZANidine (ZANAFLEX) 4 mg tablet Take 1 Tab by mouth three (3) times daily. 90 Tab 1   ??? pregabalin (LYRICA) 100 mg capsule Take 1 Cap by mouth three (3) times daily. Max Daily Amount: 300 mg. 90 Cap 1   ??? gabapentin (NEURONTIN) 600 mg tablet Take 600 mg by mouth three (3) times daily.     ??? hydrOXYzine HCl (ATARAX) 50 mg tablet Take 50 mg by mouth as needed for Anxiety.     ??? venlafaxine-SR (EFFEXOR-XR) 150  mg capsule Take 1 Cap by mouth daily (with breakfast). Indications: Anxiousness associated with Depression 15 Cap 1   ??? QUEtiapine (SEROQUEL) 50 mg tablet Take 1 Tab by mouth nightly. Indications: Additional Medications to Treat Depression (Patient not taking: Reported on 05/23/2018) 1 Tab 0   ??? prazosin (MINIPRESS) 1 mg capsule TK 1 C PO QHS         Allergies   Allergen Reactions   ??? Oxycodone-Acetaminophen Anaphylaxis and Swelling     Pt denies allergy   ??? Duloxetine Hcl Rash   ??? Aspirin Other (comments)   ??? Cortisone Nausea and  Vomiting     Also rash    ??? Ibuprofen Rash         REVIEW OF SYSTEMS    Constitutional: Negative for fever, chills, or weight change.   Respiratory: Negative for cough or shortness of breath.     Cardiovascular: Negative for chest pain or palpitations.  Gastrointestinal: Negative for acid reflux, change in bowel habits, or constipation.  Genitourinary: Negative for dysuria and flank pain.   Musculoskeletal: Positive for *** pain.  Skin: Negative for rash.   Neurological: Negative for headaches, dizziness, or numbness.***  Endo/Heme/Allergies: Negative for increased bruising.   Psychiatric/Behavioral: Negative for difficulty with sleep.    As per HPI    PHYSICAL EXAMINATION  There were no vitals taken for this visit.    Constitutional: Awake, alert, and in no acute distress.  Neurological: 1+ symmetrical DTRs in the upper extremities. 1+ symmetrical DTRs in the lower extremities. Sensation to light touch is intact. Negative Hoffman's sign bilaterally.  Skin: warm, dry, and intact.   Musculoskeletal: *** No pain with extension, axial loading, or forward flexion. No pain with internal or external rotation of his hips. Negative straight leg raise bilaterally.    ***  Biceps  Triceps Deltoids Wrist Ext Wrist Flex Hand Intrin   Right +4/5 +4/5 +4/5 +4/5 +4/5 +4/5   Left +4/5 +4/5 +4/5 +4/5 +4/5 +4/5     *** Hip Flex  Quads Hamstrings Ankle DF EHL Ankle PF   Right +4/5 +4/5 +4/5 +4/5 +4/5 +4/5   Left +4/5 +4/5 +4/5 +4/5 +4/5 +4/5     IMAGING:    *** personally reviewed with the patient and demonstrated:    Written by Robinette Haines, [employer], as dictated by Delrae Sawyers, MD.  I, Dr. Delrae Sawyers confirm that all documentation is accurate.

## 2019-04-05 ENCOUNTER — Inpatient Hospital Stay: Admit: 2019-04-05 | Payer: MEDICARE | Attending: Family Medicine | Primary: Family Medicine

## 2019-04-05 DIAGNOSIS — K8020 Calculus of gallbladder without cholecystitis without obstruction: Secondary | ICD-10-CM

## 2019-04-17 ENCOUNTER — Ambulatory Visit: Admit: 2019-04-17 | Discharge: 2019-04-17 | Payer: MEDICARE | Attending: Family Medicine | Primary: Family Medicine

## 2019-04-17 ENCOUNTER — Ambulatory Visit: Attending: Family Medicine | Primary: Family Medicine

## 2019-04-17 DIAGNOSIS — M5416 Radiculopathy, lumbar region: Secondary | ICD-10-CM

## 2019-04-17 MED ORDER — CYCLOBENZAPRINE 10 MG TAB
10 mg | ORAL_TABLET | ORAL | 1 refills | Status: AC
Start: 2019-04-17 — End: ?

## 2019-04-17 MED ORDER — CELECOXIB 200 MG CAP
200 mg | ORAL_CAPSULE | ORAL | 1 refills | Status: AC
Start: 2019-04-17 — End: ?

## 2019-04-17 MED ORDER — PREGABALIN 150 MG CAP
150 mg | ORAL_CAPSULE | Freq: Three times a day (TID) | ORAL | 1 refills | Status: AC
Start: 2019-04-17 — End: ?

## 2019-04-17 NOTE — Progress Notes (Signed)
Progress Notes by Tobie Lords, MD at 04/17/19 1445                Author: Tobie Lords, MD  Service: --  Author Type: Physician       Filed: 04/19/19 0825  Encounter Date: 04/17/2019  Status: Signed          Editor: Tobie Lords, MD (Physician)                         [facility]  [address], [address]   [place], [place] [address]   Phone: [phone]   Fax: [phone]      Pt's date of birth: 1970-11-22      ASSESSMENT   Diagnoses and all orders for this visit:      1. Lumbar radiculopathy   -     pregabalin (Lyrica) 150 mg capsule; Take 1 Cap by mouth three (3) times daily. Max Daily Amount: 450 mg.      2. Muscle spasm   -     cyclobenzaprine (FLEXERIL) 10 mg tablet; Take 1 tab by mouth BID as needed for muscle spasm      3. Lumbar facet arthropathy   -     celecoxib (CeleBREX) 200 mg capsule; Take 1 cap by mouth daily as needed for pain with meals.      4. Chronic pain of right knee   -     REFERRAL TO ORTHOPEDICS      5. Weight loss             IMPRESSION AND PLAN:   Jim Shaw is a 49 y.o. male with history of cervical and lumbar pain and presents to the office today for follow up. He complains of increased pain in the lower back, L>R, and continued  pain in the right knee. He takes Lyrica 150 mg 1 cap TID. Pt also takes Flexeril 10 mg and Celebrex 100 mg. I will refill his Lyrica 150 mg 1 cap TID, Flexeril 10 mg, and Celebrex 100 mg. I will also refer him to Dr. Deirdre Evener for evaluation of his  right knee pain.          1) He received a refill Lyrica 150 mg 1 cap TID as directed, 270 tabs.   2) Pt received a refill of Celebrex 200 mg 1 cap daily, 90 tabs.    3) He received a refill of Flexeril 10 mg to 1 tab BID prn muscle spasm, 180 tabs.   4) I advised him on the importance of vitamin D   5) [name] has a reminder for  a "due or due soon" health maintenance. I have asked that he contact his  primary care provider,  [name], MD, for follow-up on this health maintenance and for his weight loss- discussed evaluation by  GI.   6) PMP demonstrated consistency with prescribing.   7) I will refer him to Dr. Precious Haws for evaluation of  his right knee     Follow-up and Dispositions      ??  Return in about 3 months (around 07/18/2019) for Medication follow up.                      HISTORY OF PRESENT ILLNESS:   Jim Shaw is a 49 y.o.  male with history of cervical and lumbar pain and presents to the office  today for follow up. He complains of increased pain in the lower  back, L>R, and continued pain in the right knee. He takes Lyrica 150 mg 1 cap TID. Pt also takes Flexeril 10 mg and Celebrex 100 mg. Pt notes he has lost a lot of weight recently, he has been having diarrhea, and burning of the midsection. The Pt's urologist,  who he sees for bladder issues, was the first to alert him of his weight loss issue. Pt states he is not being followed by an orthopedic specialist for his right knee. Pt states sometimes his low back pain radiates down his left leg but it's mostly his  right leg.Pt at this time desires to continue with refills of  Lyrica 150 mg 1 cap TID, Flexeril 10 mg, and Celebrex 100 mg and a referral to Dr. Meyer Cory for evaluation of his right knee.      Pain Scale: 9/10      PCP: [name], MD         Past Medical History:        Diagnosis  Date         ?  Anxiety       ?  Costochondritis       ?  Depression       ?  Major depressive disorder  11/08/2017     ?  Psychiatric disorder           ?  PTSD (post-traumatic stress disorder)               Social History          Socioeconomic History         ?  Marital status:  SINGLE              Spouse name:  Not on file         ?  Number of children:  Not on file     ?  Years of education:  Not on file     ?  Highest education level:  Not on file       Occupational History        ?  Not on file       Social Needs         ?  Financial  resource strain:  Not on file        ?  Food insecurity              Worry:  Not on file         Inability:  Not on file        ?  Transportation needs              Medical:  Not on file         Non-medical:  Not on file       Tobacco Use         ?  Smoking status:  Current Every Day Smoker              Packs/day:  1.00         Years:  20.00         Pack years:  20.00         ?  Smokeless tobacco:  Never Used        ?  Tobacco comment: currently increased stress & increased smoking       Substance and Sexual Activity         ?  Alcohol use:  Yes             Comment: 5-6 shots/day         ?  Drug use:  Yes              Types:  Marijuana         ?  Sexual activity:  Not on file       Lifestyle        ?  Physical activity              Days per week:  Not on file         Minutes per session:  Not on file         ?  Stress:  Not on file       Relationships        ?  Social Health visitor on phone:  Not on file         Gets together:  Not on file         Attends religious service:  Not on file         Active member of club or organization:  Not on file         Attends meetings of clubs or organizations:  Not on file         Relationship status:  Not on file        ?  Intimate partner violence              Fear of current or ex partner:  Not on file         Emotionally abused:  Not on file         Physically abused:  Not on file         Forced sexual activity:  Not on file        Other Topics  Concern         ?  Military Service  Not Asked     ?  Blood Transfusions  Not Asked     ?  Caffeine Concern  Not Asked     ?  Occupational Exposure  Not Asked     ?  Hobby Hazards  Not Asked     ?  Sleep Concern  Not Asked     ?  Stress Concern  Not Asked     ?  Weight Concern  Not Asked     ?  Special Diet  Not Asked     ?  Back Care  Not Asked     ?  Exercise  Not Asked     ?  Bike Helmet  Not Asked     ?  Seat Belt  Not Asked     ?  Self-Exams  Not Asked       Social History Narrative        ?  Not on file              Current Outpatient Medications          Medication  Sig  Dispense  Refill           ?  ALPRAZolam (XANAX) 0.5 mg tablet  Take 1 Tab by mouth two (2) times daily as needed.         ?  oxybutynin chloride XL (DITROPAN XL) 10 mg CR tablet  Take 10 mg  by mouth daily.         ?  sildenafil citrate (VIAGRA) 50 mg tablet  Take 50 mg by mouth as needed.         ?  Rexulti 0.5 mg tab tablet  Take 1 Tab by mouth daily.         ?  pregabalin (Lyrica) 150 mg capsule  Take 1 Cap by mouth three (3) times daily. Max Daily Amount: 450 mg.  270 Cap  1     ?  cyclobenzaprine (FLEXERIL) 10 mg tablet  Take 1 tab by mouth BID as needed for muscle spasm  180 Tab  1     ?  celecoxib (CeleBREX) 200 mg capsule  Take 1 cap by mouth daily as needed for pain with meals.  90 Cap  1     ?  chlorzoxazone (Parafon Forte DSC) 500 mg tablet  Take 1 tab by mouth BID-TID as needed for muscle spasm.  60 Tab  1     ?  diazePAM (VALIUM) 10 mg tablet  TK 1 T PO  QHS         ?  ramelteon (ROZEREM) 8 mg tablet  TK 1 T PO HS         ?  risperiDONE (RisperDAL) 1 mg tablet  TK 2 TS PO HS         ?  tiZANidine (ZANAFLEX) 4 mg tablet  Take 1 tab by mouth BID-TID as needed for muscle spasm.  60 Tab  1     ?  triamcinolone acetonide (KENALOG) 0.1 % ointment           ?  hydrOXYzine HCl (ATARAX) 50 mg tablet  Take 50 mg by mouth as needed for Anxiety.         ?  omeprazole (PRILOSEC) 20 mg capsule  Take 20 mg by mouth daily.         ?  eszopiclone (LUNESTA) 3 mg tablet  Take 3 mg by mouth nightly.         ?  DULoxetine (CYMBALTA) 60 mg capsule  TK ONE C PO D         ?  pregabalin (Lyrica) 75 mg capsule  Take 1-2 caps by mouth two (2) times daily as directed. Max Daily Amount: 300 mg.  Indications: disorder characterized by stiff, tender & painful muscles  (Patient not taking: Reported on 04/17/2019)  120 Cap  1     ?  lamoTRIgine (LaMICtal) 25 mg tablet  Take 100 mg by mouth nightly.         ?  mirtazapine (REMERON) 15 mg tablet  TK 1 T PO HS         ?   fluticasone propionate (FLONASE) 50 mcg/actuation nasal spray  2 Sprays by Both Nostrils route daily.         ?  ciclopirox (PENLAC) 8 % solution  Apply  to affected area nightly.         ?  celecoxib (CELEBREX) 100 mg capsule  Take 1 Cap by mouth daily.         ?  tiZANidine (ZANAFLEX) 4 mg tablet  Take 1 Tab by mouth three (3) times daily.  90 Tab  1     ?  pregabalin (LYRICA) 100 mg capsule  Take 1 Cap by mouth three (3) times daily. Max Daily Amount: 300 mg. (Patient not taking: Reported on 04/17/2019)  90 Cap  1     ?  gabapentin (NEURONTIN) 600 mg tablet  Take 600 mg by mouth three (3) times daily.         ?  venlafaxine-SR (EFFEXOR-XR) 150 mg capsule  Take 1 Cap by mouth daily (with breakfast). Indications: Anxiousness associated with Depression  15 Cap  1     ?  QUEtiapine (SEROQUEL) 50 mg tablet  Take 1 Tab by mouth nightly. Indications: Additional Medications to Treat Depression (Patient not taking: Reported on 05/23/2018)  1 Tab  0           ?  prazosin (MINIPRESS) 1 mg capsule  TK 1 C PO QHS                 Allergies        Allergen  Reactions         ?  Oxycodone-Acetaminophen  Anaphylaxis and Swelling             Pt denies allergy         ?  Duloxetine Hcl  Rash     ?  Aspirin  Other (comments)     ?  Cortisone  Nausea and Vomiting             Also rash          ?  Ibuprofen  Rash              REVIEW OF SYSTEMS      Constitutional: Negative for fever, chills; positive for weight loss    Respiratory: Negative for cough or shortness of breath.      Cardiovascular: Negative for chest pain or palpitations.   Gastrointestinal: Negative for acid reflux and constipation; positive for abdominal pain and diarrhea and decreased appetite.   Genitourinary: Negative for dysuria and flank pain.    Musculoskeletal: Positive for cervical, right knee, and lumbar pain.   Neurological: Negative for headaches or dizziness. Positive for numbness.   Endo/Heme/Allergies: Negative for increased bruising.     Psychiatric/Behavioral: Positive for difficulty with sleep.     As per HPI      PHYSICAL EXAMINATION   Visit Vitals      BP  132/77     Pulse  77     Temp  98.6 ??F (37 ??C) (Temporal)     Resp  16     Ht  6' (1.829 m)     Wt  265 lb 9.6 oz (120.5 kg)     SpO2  97%        BMI  36.02 kg/m??           Constitutional: Awake, alert, and in no acute distress.  Neurological: 1+ symmetrical DTRs in the upper extremities. 1+ symmetrical DTRs in the lower extremities. Sensation to light touch is intact. Negative Hoffman's sign bilaterally.   Skin: warm, dry, and intact.    Musculoskeletal: Decreased range of motion with side to side cervical flexon. Tight across the upper trapezius bilaterally. Tenderness to palpation in the lower lumbar region. Moderate pain with extension and axial loading. No pain with internal  or external rotation of his hips. Positive straight leg raise on the left. Pt presents to the office today wearing a hinged knee support.                 Biceps   Triceps  Deltoids  Wrist Ext  Wrist Flex  Hand Intrin             Right  +4/5  +4/5  +  4/5  +4/5  +4/5  +4/5             Left  +4/5  +4/5  +4/5  +4/5  +4/5  +4/5                  Hip Flex   Quads  Hamstrings  Ankle DF  EHL  Ankle PF             Right  +4/5  +4/5  +4/5  +4/5  +4/5  +4/5             Left  +4/5  +4/5  +4/5  +4/5  +4/5  +4/5        IMAGING:      Hip and??knee x-rays from 04/10/2018 were??personally reviewed with the patient and demonstrated:   Minimal??degenerative??changes.??   ??   Cervical MRI from 10/13/2017 was personally reviewed with the patient and demonstrated:     ??       Interface, [facility] - 10/13/2017 ??4:00 PM EDT  EXAM: MRI of cervical spine without contrast    CLINICAL INDICATION/HISTORY:  M54.41: Low back pain with right-sided sciatica    COMPARISON: None    TECHNIQUE: Multiplanar multi-sequential imaging of the cervical spine without contrast. T1W, T2W and STIR sequences were obtained in the sagittal plane. T2W and GRE  sequences  were obtained in the axial plane.    FINDINGS:     Postoperative changes: None.    Alignment: Overall straightening of normal cervical lordosis. No focal listhesis.    Vertebral body heights: Preserved. No edema.  -Mild  intervertebral disc space narrowing at C5-6 and C4-5. Slight disc bulge at several levels.    Marrow signal: No edema.    Cord: Normal caliber. No syrinx or edema.    Cerebellar tonsils: No Chiari 1 malformation.      Correlation  of axial and sagittal data through the disc levels:    -C2-3: No significant canal or foraminal stenosis    -C3-4: Mild broad-based posterior disc bulge effaces fluid space and contributes to mild central canal narrowing to 10 mm. No significant  foraminal narrowing.    -C4-5: Minimal broad-based posterior disc bulge. No significant canal narrowing.   -Mild to moderate right foraminal stenosis due to asymmetric disc bulge and some uncovertebral spur.    -C5-6: Broad-based posterior  disc bulge effaces fluid space and may contact the left ventral cervical cord. No edema. Mild central canal narrowing to 9.5 mm.   -There is mild left lateral recess narrowing and mild to borderline moderate left foraminal narrowing due to disc bulge  and uncovertebral spur.    -C6-7: No significant canal or foraminal stenosis.    -C7-T1: No significant canal or foraminal stenosis.      IMPRESSION  1. Straightened cervical lordosis. No compression deformity, edema or listhesis.     2. Multilevel degenerative discogenic disease with mild degree of canal narrowing at several levels.  -Ventral cord contact and minimal distortion due to disc bulge C5-C6 as above.    3. Varying degrees of bilateral foraminal narrowing most  significant right C4-5 and left C5-6.    Signed By: Emilia Beck, MD on 10/13/2017 3:58 PM     ??   ??   ??   Lumbar MRI from 10/13/2017 was personally reviewed with the patient and demonstrated:     ??       Interface, [facility] Rad Res - 10/13/2017 ??4:37 PM  EDT  EXAM: MR LUMBAR SPINE WITHOUT CONTRAST    CLINICAL INDICATION/HISTORY:  ??M54.41: Low back pain with right-sided sciatica    COMPARISON: None    TECHNIQUE: Multiplanar multi-sequential imaging of the lumbar spine without contrast. T1W, T2W and STIR sequences were obtained in the sagittal plane. Stacked axial  T2 and axial T1W through the lower 3 disc levels.    FINDINGS:     Postoperative : None.    Vertebral alignment: Mild straightening of lumbar lordosis with trace retrolisthesis of L5. No spondylolysis.    Vertebral body heights:  Normal. Mild endplate concavity in L5 and L4.    Marrow signal: Normal.    Cord: Conus medullaris terminates at the L1-L2 level with normal signal.    Soft tissues: Normal.    Correlation of axial and sagittal data through  the disc levels:    -L1-2: No significant disc pathology. No spinal canal or foraminal stenosis.    -L2-3: No significant disc pathology. Mild facet and ligamentous hypertrophy with bilateral facet joint effusion. No central or foraminal  stenosis    -L3-4: No significant disc pathology. Similarly, mild bilateral facet and ligamentous hypertrophy with small facet joint effusion. No central or foraminal stenosis.    -L4-5: Mild posterior disc bulge. No central stenosis. More  severe facet and ligamentous hypertrophy with mild facet inflammation. No significant foraminal stenosis    -L5-S1: Disc bulge most prominent. Still no severe central stenosis. Facet and ligamentous hypertrophy with inflammation. Moderate bilateral  foraminal stenosis. Mild compression of bilateral exiting L5 nerve root suspected.    IMPRESSION  Posterior disc bulge and facet/ligamentum flavum hypertrophy most prominent at L5-S1. Moderate bilateral foraminal stenosis with suspected mild  compression bilateral exiting L5 nerve roots. Facet hypertrophy and inflammation throughout.    Signed By: Gretel Acre, MD on 10/13/2017 4:35 PM     ??   ??   Right upper extremity EMG from 12/27/2017 was  personally reviewed with the patient and demonstrated:   NCV & EMG Findings:   All nerve conduction studies (as indicated in the following tables) were within normal limits. ??   ??   All examined muscles (as indicated in the following table) showed no evidence of electrical instability.????   ??   INTERPRETATION   This was a normal nerve conduction and EMG study showing there to be no signs of neuropathy, myopathy, or radiculopathy in the nerves and muscles tested.??   ??   CLINICAL INTERPRETATION   There are no electrodiagnostic findings correlating with his symptoms.??      Written by Karis Juba, [place], as dictated by Delrae Sawyers, MD.   I, Dr. Delrae Sawyers confirm that all documentation is accurate.

## 2019-04-17 NOTE — Progress Notes (Signed)
[facility]  [address]  [place], [place] [address]  Phone: [phone]  Fax: [phone]    Pt's date of birth: [birth year]    ASSESSMENT   Diagnoses and all orders for this visit:    1. Lumbar radiculopathy  -     pregabalin (Lyrica) 150 mg capsule; Take 1 Cap by mouth three (3) times daily. Max Daily Amount: 450 mg.    2. Muscle spasm  -     cyclobenzaprine (FLEXERIL) 10 mg tablet; Take 1 tab by mouth BID as needed for muscle spasm    3. Lumbar facet arthropathy  -     celecoxib (CeleBREX) 200 mg capsule; Take 1 cap by mouth daily as needed for pain with meals.    4. Chronic pain of right knee  -     REFERRAL TO ORTHOPEDICS    5. Weight loss         IMPRESSION AND PLAN:  Bostyn Bogie is a 49 y.o. male with history of cervical and lumbar pain and presents to the office today for follow up. He complains of increased pain in the lower back, L>R, and continued pain in the right knee. He takes Lyrica 150 mg 1 cap TID. Pt also takes Flexeril 10 mg and Celebrex 100 mg. I will refill his Lyrica 150 mg 1 cap TID, Flexeril 10 mg, and Celebrex 100 mg. I will also refer him to Dr. Deirdre Evener for evaluation of his right knee pain.       1) He received a refill Lyrica 150 mg 1 cap TID as directed, 270 tabs.  2) Pt received a refill of Celebrex 200 mg 1 cap daily, 90 tabs.   3) He received a refill of Flexeril 10 mg to 1 tab BID prn muscle spasm, 180 tabs.  4) I advised him on the importance of vitamin D  5) Mr. Shrewsbury has a reminder for a "due or due soon" health maintenance. I have asked that he contact his primary care provider, [name], MD, for follow-up on this health maintenance and for his weight loss- discussed evaluation by GI.  6) PMP demonstrated consistency with prescribing.  7) I will refer him to Dr. Precious Haws for evaluation of  his right knee  Follow-up and Dispositions    ?? Return in about 3 months (around 07/18/2019) for  Medication follow up.             HISTORY OF PRESENT ILLNESS:  Rutherford Alarie is a 49 y.o. male with history of cervical and lumbar pain and presents to the office today for follow up. He complains of increased pain in the lower back, L>R, and continued pain in the right knee. He takes Lyrica 150 mg 1 cap TID. Pt also takes Flexeril 10 mg and Celebrex 100 mg. Pt notes he has lost a lot of weight recently, he has been having diarrhea, and burning of the midsection. The Pt's urologist, who he sees for bladder issues, was the first to alert him of his weight loss issue. Pt states he is not being followed by an orthopedic specialist for his right knee. Pt states sometimes his low back pain radiates down his left leg but it's mostly his right leg.Pt at this time desires to continue with refills of  Lyrica 150 mg 1 cap TID, Flexeril 10 mg, and Celebrex 100 mg and a referral to Dr. Meyer Cory for evaluation of his right knee.  Pain Scale: 9/10    PCP: Foye Clock, MD     Past Medical History:   Diagnosis Date   ??? Anxiety    ??? Costochondritis    ??? Depression    ??? Major depressive disorder 11/08/2017   ??? Psychiatric disorder    ??? PTSD (post-traumatic stress disorder)         Social History     Socioeconomic History   ??? Marital status: SINGLE     Spouse name: Not on file   ??? Number of children: Not on file   ??? Years of education: Not on file   ??? Highest education level: Not on file   Occupational History   ??? Not on file   Social Needs   ??? Financial resource strain: Not on file   ??? Food insecurity     Worry: Not on file     Inability: Not on file   ??? Transportation needs     Medical: Not on file     Non-medical: Not on file   Tobacco Use   ??? Smoking status: Current Every Day Smoker     Packs/day: 1.00     Years: 20.00     Pack years: 20.00   ??? Smokeless tobacco: Never Used   ??? Tobacco comment: currently increased stress & increased smoking   Substance and Sexual Activity   ??? Alcohol use: Yes     Comment:  5-6 shots/day   ??? Drug use: Yes     Types: Marijuana   ??? Sexual activity: Not on file   Lifestyle   ??? Physical activity     Days per week: Not on file     Minutes per session: Not on file   ??? Stress: Not on file   Relationships   ??? Social Product manager on phone: Not on file     Gets together: Not on file     Attends religious service: Not on file     Active member of club or organization: Not on file     Attends meetings of clubs or organizations: Not on file     Relationship status: Not on file   ??? Intimate partner violence     Fear of current or ex partner: Not on file     Emotionally abused: Not on file     Physically abused: Not on file     Forced sexual activity: Not on file   Other Topics Concern   ??? Military Service Not Asked   ??? Blood Transfusions Not Asked   ??? Caffeine Concern Not Asked   ??? Occupational Exposure Not Asked   ??? Hobby Hazards Not Asked   ??? Sleep Concern Not Asked   ??? Stress Concern Not Asked   ??? Weight Concern Not Asked   ??? Special Diet Not Asked   ??? Back Care Not Asked   ??? Exercise Not Asked   ??? Bike Helmet Not Asked   ??? Seat Belt Not Asked   ??? Self-Exams Not Asked   Social History Narrative   ??? Not on file       Current Outpatient Medications   Medication Sig Dispense Refill   ??? ALPRAZolam (XANAX) 0.5 mg tablet Take 1 Tab by mouth two (2) times daily as needed.     ??? oxybutynin chloride XL (DITROPAN XL) 10 mg CR tablet Take 10 mg by mouth daily.     ??? sildenafil citrate (VIAGRA) 50 mg  tablet Take 50 mg by mouth as needed.     ??? Rexulti 0.5 mg tab tablet Take 1 Tab by mouth daily.     ??? pregabalin (Lyrica) 150 mg capsule Take 1 Cap by mouth three (3) times daily. Max Daily Amount: 450 mg. 270 Cap 1   ??? cyclobenzaprine (FLEXERIL) 10 mg tablet Take 1 tab by mouth BID as needed for muscle spasm 180 Tab 1   ??? celecoxib (CeleBREX) 200 mg capsule Take 1 cap by mouth daily as needed for pain with meals. 90 Cap 1   ??? chlorzoxazone (Parafon Forte DSC) 500 mg tablet Take 1 tab by mouth BID-TID  as needed for muscle spasm. 60 Tab 1   ??? diazePAM (VALIUM) 10 mg tablet TK 1 T PO  QHS     ??? ramelteon (ROZEREM) 8 mg tablet TK 1 T PO HS     ??? risperiDONE (RisperDAL) 1 mg tablet TK 2 TS PO HS     ??? tiZANidine (ZANAFLEX) 4 mg tablet Take 1 tab by mouth BID-TID as needed for muscle spasm. 60 Tab 1   ??? triamcinolone acetonide (KENALOG) 0.1 % ointment      ??? hydrOXYzine HCl (ATARAX) 50 mg tablet Take 50 mg by mouth as needed for Anxiety.     ??? omeprazole (PRILOSEC) 20 mg capsule Take 20 mg by mouth daily.     ??? eszopiclone (LUNESTA) 3 mg tablet Take 3 mg by mouth nightly.     ??? DULoxetine (CYMBALTA) 60 mg capsule TK ONE C PO D     ??? pregabalin (Lyrica) 75 mg capsule Take 1-2 caps by mouth two (2) times daily as directed. Max Daily Amount: 300 mg.  Indications: disorder characterized by stiff, tender & painful muscles (Patient not taking: Reported on 04/17/2019) 120 Cap 1   ??? lamoTRIgine (LaMICtal) 25 mg tablet Take 100 mg by mouth nightly.     ??? mirtazapine (REMERON) 15 mg tablet TK 1 T PO HS     ??? fluticasone propionate (FLONASE) 50 mcg/actuation nasal spray 2 Sprays by Both Nostrils route daily.     ??? ciclopirox (PENLAC) 8 % solution Apply  to affected area nightly.     ??? celecoxib (CELEBREX) 100 mg capsule Take 1 Cap by mouth daily.     ??? tiZANidine (ZANAFLEX) 4 mg tablet Take 1 Tab by mouth three (3) times daily. 90 Tab 1   ??? pregabalin (LYRICA) 100 mg capsule Take 1 Cap by mouth three (3) times daily. Max Daily Amount: 300 mg. (Patient not taking: Reported on 04/17/2019) 90 Cap 1   ??? gabapentin (NEURONTIN) 600 mg tablet Take 600 mg by mouth three (3) times daily.     ??? venlafaxine-SR (EFFEXOR-XR) 150 mg capsule Take 1 Cap by mouth daily (with breakfast). Indications: Anxiousness associated with Depression 15 Cap 1   ??? QUEtiapine (SEROQUEL) 50 mg tablet Take 1 Tab by mouth nightly. Indications: Additional Medications to Treat Depression (Patient not taking: Reported on 05/23/2018) 1 Tab 0   ??? prazosin (MINIPRESS) 1  mg capsule TK 1 C PO QHS         Allergies   Allergen Reactions   ??? Oxycodone-Acetaminophen Anaphylaxis and Swelling     Pt denies allergy   ??? Duloxetine Hcl Rash   ??? Aspirin Other (comments)   ??? Cortisone Nausea and Vomiting     Also rash    ??? Ibuprofen Rash         REVIEW OF SYSTEMS  Constitutional: Negative for fever, chills; positive for weight loss   Respiratory: Negative for cough or shortness of breath.     Cardiovascular: Negative for chest pain or palpitations.  Gastrointestinal: Negative for acid reflux and constipation; positive for abdominal pain and diarrhea and decreased appetite.  Genitourinary: Negative for dysuria and flank pain.   Musculoskeletal: Positive for cervical, right knee, and lumbar pain.  Neurological: Negative for headaches or dizziness. Positive for numbness.  Endo/Heme/Allergies: Negative for increased bruising.   Psychiatric/Behavioral: Positive for difficulty with sleep.    As per HPI    PHYSICAL EXAMINATION  Visit Vitals  BP 132/77   Pulse 77   Temp 98.6 ??F (37 ??C) (Temporal)   Resp 16   Ht 6' (1.829 m)   Wt 265 lb 9.6 oz (120.5 kg)   SpO2 97%   BMI 36.02 kg/m??       Constitutional: Awake, alert, and in no acute distress.  Neurological: 1+ symmetrical DTRs in the upper extremities. 1+ symmetrical DTRs in the lower extremities. Sensation to light touch is intact. Negative Hoffman's sign bilaterally.  Skin: warm, dry, and intact.   Musculoskeletal: Decreased range of motion with side to side cervical flexon. Tight across the upper trapezius bilaterally. Tenderness to palpation in the lower lumbar region. Moderate pain with extension and axial loading. No pain with internal or external rotation of his hips. Positive straight leg raise on the left. Pt presents to the office today wearing a hinged knee support.      Biceps  Triceps Deltoids Wrist Ext Wrist Flex Hand Intrin   Right +4/5 +4/5 +4/5 +4/5 +4/5 +4/5   Left +4/5 +4/5 +4/5 +4/5 +4/5 +4/5      Hip Flex  Quads Hamstrings Ankle  DF EHL Ankle PF   Right +4/5 +4/5 +4/5 +4/5 +4/5 +4/5   Left +4/5 +4/5 +4/5 +4/5 +4/5 +4/5     IMAGING:    Hip and??knee x-rays from 04/10/2018 were??personally reviewed with the patient and demonstrated:  Minimal??degenerative??changes.??  ??  Cervical MRI from 10/13/2017 was personally reviewed with the patient and demonstrated:  ??   Interface, [facility] - 10/13/2017 ??4:00 PM EDT  EXAM: MRI of cervical spine without contrast    CLINICAL INDICATION/HISTORY: M54.41: Low back pain with right-sided sciatica    COMPARISON: None    TECHNIQUE: Multiplanar multi-sequential imaging of the cervical spine without contrast. T1W, T2W and STIR sequences were obtained in the sagittal plane. T2W and GRE sequences were obtained in the axial plane.    FINDINGS:     Postoperative changes: None.    Alignment: Overall straightening of normal cervical lordosis. No focal listhesis.    Vertebral body heights: Preserved. No edema.  -Mild intervertebral disc space narrowing at C5-6 and C4-5. Slight disc bulge at several levels.    Marrow signal: No edema.    Cord: Normal caliber. No syrinx or edema.    Cerebellar tonsils: No Chiari 1 malformation.      Correlation of axial and sagittal data through the disc levels:    -C2-3: No significant canal or foraminal stenosis    -C3-4: Mild broad-based posterior disc bulge effaces fluid space and contributes to mild central canal narrowing to 10 mm. No significant foraminal narrowing.    -C4-5: Minimal broad-based posterior disc bulge. No significant canal narrowing.   -Mild to moderate right foraminal stenosis due to asymmetric disc bulge and some uncovertebral spur.    -C5-6: Broad-based posterior disc bulge effaces fluid space and may  contact the left ventral cervical cord. No edema. Mild central canal narrowing to 9.5 mm.   -There is mild left lateral recess narrowing and mild to borderline moderate left foraminal narrowing due to disc bulge and uncovertebral spur.    -C6-7: No significant  canal or foraminal stenosis.    -C7-T1: No significant canal or foraminal stenosis.      IMPRESSION  1. Straightened cervical lordosis. No compression deformity, edema or listhesis.    2. Multilevel degenerative discogenic disease with mild degree of canal narrowing at several levels.  -Ventral cord contact and minimal distortion due to disc bulge C5-C6 as above.    3. Varying degrees of bilateral foraminal narrowing most significant right C4-5 and left C5-6.    Signed By: Emilia Beckichard J Thomas, MD on 10/13/2017 3:58 PM   ??  ??  ??  Lumbar MRI from 10/13/2017 was personally reviewed with the patient and demonstrated:  ??   Interface, [facility] Rad Res - 10/13/2017 ??4:37 PM EDT  EXAM: MR LUMBAR SPINE WITHOUT CONTRAST    CLINICAL INDICATION/HISTORY: ??M54.41: Low back pain with right-sided sciatica    COMPARISON: None    TECHNIQUE: Multiplanar multi-sequential imaging of the lumbar spine without contrast. T1W, T2W and STIR sequences were obtained in the sagittal plane. Stacked axial T2 and axial T1W through the lower 3 disc levels.    FINDINGS:     Postoperative : None.    Vertebral alignment: Mild straightening of lumbar lordosis with trace retrolisthesis of L5. No spondylolysis.    Vertebral body heights: Normal. Mild endplate concavity in L5 and L4.    Marrow signal: Normal.    Cord: Conus medullaris terminates at the L1-L2 level with normal signal.    Soft tissues: Normal.    Correlation of axial and sagittal data through the disc levels:    -L1-2: No significant disc pathology. No spinal canal or foraminal stenosis.    -L2-3: No significant disc pathology. Mild facet and ligamentous hypertrophy with bilateral facet joint effusion. No central or foraminal stenosis    -L3-4: No significant disc pathology. Similarly, mild bilateral facet and ligamentous hypertrophy with small facet joint effusion. No central or foraminal stenosis.    -L4-5: Mild posterior disc bulge. No central stenosis. More severe facet and ligamentous  hypertrophy with mild facet inflammation. No significant foraminal stenosis    -L5-S1: Disc bulge most prominent. Still no severe central stenosis. Facet and ligamentous hypertrophy with inflammation. Moderate bilateral foraminal stenosis. Mild compression of bilateral exiting L5 nerve root suspected.    IMPRESSION  Posterior disc bulge and facet/ligamentum flavum hypertrophy most prominent at L5-S1. Moderate bilateral foraminal stenosis with suspected mild compression bilateral exiting L5 nerve roots. Facet hypertrophy and inflammation throughout.    Signed By: Gretel Acrehan V Nguyen, MD on 10/13/2017 4:35 PM   ??  ??  Right upper extremity EMG from 12/27/2017 was personally reviewed with the patient and demonstrated:  NCV & EMG Findings:  All nerve conduction studies (as indicated in the following tables) were within normal limits. ??  ??  All examined muscles (as indicated in the following table) showed no evidence of electrical instability.????  ??  INTERPRETATION  This was a normal nerve conduction and EMG study showing there to be no signs of neuropathy, myopathy, or radiculopathy in the nerves and muscles tested.??  ??  CLINICAL INTERPRETATION  There are no electrodiagnostic findings correlating with his symptoms.??    Written by Karis JubaAlex Duran, Scrib[place], as dictated by Delrae Sawyersheresa Renn Dirocco, MD.  I,  Dr. Delorise Oprah Camarena confirm that all documentation is accurate.

## 2019-05-03 ENCOUNTER — Ambulatory Visit: Payer: MEDICARE | Attending: Specialist | Primary: Family Medicine

## 2019-05-06 ENCOUNTER — Encounter: Attending: Specialist | Primary: Family Medicine

## 2019-05-16 ENCOUNTER — Ambulatory Visit: Payer: MEDICARE | Attending: Specialist | Primary: Family Medicine

## 2019-05-23 ENCOUNTER — Encounter: Payer: MEDICARE | Attending: Specialist | Primary: Family Medicine

## 2019-06-27 ENCOUNTER — Ambulatory Visit: Admit: 2019-06-27 | Discharge: 2019-06-27 | Payer: MEDICARE | Attending: Specialist | Primary: Family Medicine

## 2019-06-27 ENCOUNTER — Ambulatory Visit: Attending: Specialist | Primary: Family Medicine

## 2019-06-27 DIAGNOSIS — M2241 Chondromalacia patellae, right knee: Secondary | ICD-10-CM

## 2019-06-27 MED ORDER — DICLOFENAC 1 % TOPICAL GEL
1 % | Freq: Four times a day (QID) | CUTANEOUS | 5 refills | Status: AC
Start: 2019-06-27 — End: ?
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
## 2019-06-27 NOTE — Telephone Encounter (Signed)
Caryn Bee from [facility] called stating he needs the office notes from today's visit once they are completed in order to process the patient's soft hinged knee brace order.    Fax: [phone] attn Blanchie Serve can be reached at [phone].

## 2019-06-27 NOTE — Telephone Encounter (Signed)
Kevin from [facility] called stating he needs the office notes from today's visit once they are completed in order to process the patient's soft hinged knee brace order.    Fax: [phone] attn Kevin    Kevin can be reached at [phone].
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
## 2019-07-03 NOTE — Telephone Encounter (Signed)
Faxed 06-27-19 office note, as requested.

## 2019-07-05 NOTE — Telephone Encounter (Signed)
Dr Liliane Channel revised office note--faxed

## 2019-07-05 NOTE — Telephone Encounter (Signed)
Patient called stating in order for him to get his soft hinged knee brace it needs to be added to the office notes from 06/27/19 that the brace he already has is too big and not fitting right since it keeps slipping off. He's asking if the notes can be revised and then re-faxed to [facility]. Please advise patient when completed at [phone]. He says he has a DMV appointment at 2:10 PM today but he should be able to answer his phone around 3:15-3:30 PM.

## 2019-07-08 ENCOUNTER — Inpatient Hospital Stay: Payer: MEDICARE | Primary: Family Medicine

## 2019-07-08 DIAGNOSIS — M25561 Pain in right knee: Secondary | ICD-10-CM

## 2019-07-15 ENCOUNTER — Encounter: Payer: MEDICARE | Primary: Family Medicine

## 2019-07-17 ENCOUNTER — Encounter: Attending: Family Medicine | Primary: Family Medicine

## 2019-07-23 ENCOUNTER — Inpatient Hospital Stay: Admit: 2019-07-23 | Payer: MEDICARE | Primary: Family Medicine
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
## 2019-07-23 NOTE — Progress Notes (Signed)
 PT DAILY TREATMENT NOTE 11-20    Patient Name: Jim Shaw  Date:07/23/2019  DOB: 1970-09-13  [x]   Patient DOB Verified  Payor: VA MEDICARE / Plan: VA MEDICARE PART A & B / Product Type: Medicare /    In time:2:20  Out time:3:00  Total Treatment Time (min): 40  Visit #: 1 of 10    Medicare/BCBS Only   Total Timed Codes (min):  23 1:1 Treatment Time:  40       Treatment Area: Pain in right knee [M25.561]  Chondromalacia patellae, right knee [M22.41]  Unilateral primary osteoarthritis, right knee [M17.11]    SUBJECTIVE  Pain Level (0-10 scale): 8/10  Any medication changes, allergies to medications, adverse drug reactions, diagnosis change, or new procedure performed?: [x]  No    []  Yes (see summary sheet for update)  Subjective functional status/changes:   []  No changes reported  See paper initial evaluation note.    OBJECTIVE    17 min [x] Eval                  [] Re-Eval       23 min Therapeutic Activity:  []   See flow sheet : diagnosis; prognosis; HEP given and reviewed with Pt; benefits of aquatics program; discussed informing MD and possible nutritional consult need due to excessive weight loss, loss of appetite, dietary changes in recent months   Rationale: increase ROM and increase strength  to improve the patient's ability to increase right LE strength and stability for improved standing/amb tolerance           With   []  TE   [x]  TA   []  neuro   []  other: Patient Education: [x]  Review HEP    []  Progressed/Changed HEP based on:   []  positioning   []  body mechanics   []  transfers   []  heat/ice application    []  other:      Other Objective/Functional Measures: FOTO 34 pts     Pain Level (0-10 scale) post treatment: 8/10    ASSESSMENT/Changes in Function:     Patient will continue to benefit from skilled PT services to address functional mobility deficits, address ROM deficits, address strength deficits, analyze and address soft tissue restrictions, analyze and cue movement patterns, analyze and modify  body mechanics/ergonomics, assess and modify postural abnormalities, address imbalance/dizziness, and instruct in home and community integration to attain remaining goals.     [x]   See Plan of Care  []   See progress note/recertification  []   See Discharge Summary         Progress towards goals / Updated goals:  See plan of care.    PLAN  []   Upgrade activities as tolerated     [x]   Continue plan of care  []   Update interventions per flow sheet       []   Discharge due to:_  []   Other:_      Veva SAILOR Daughtry, PT 07/23/2019  6:07 PM    Future Appointments   Date Time Provider Department Center   07/25/2019  2:15 PM POOL [facility]   07/30/2019  2:30 PM [facility]OL [facility]   08/01/2019  3:15 PM [facility]   08/06/2019  2:45 PM [facility]   08/08/2019  2:45 PM [facility]   08/13/2019  2:15 PM [facility]   08/15/2019  2:15 PM [facility]  08/20/2019  2:15 PM [facility]   08/22/2019  2:15 PM [facility]

## 2019-07-25 ENCOUNTER — Inpatient Hospital Stay: Admit: 2019-07-25 | Payer: MEDICARE | Primary: Family Medicine

## 2019-07-25 DIAGNOSIS — M25561 Pain in right knee: Secondary | ICD-10-CM

## 2019-07-30 ENCOUNTER — Inpatient Hospital Stay: Admit: 2019-07-30 | Payer: MEDICARE | Primary: Family Medicine

## 2019-07-30 NOTE — Progress Notes (Signed)
 [facility]  [address] [place], [place] [address]  [phone]  [phone] fax    Discharge Summary  Patient name: Jim Shaw Start of Care: 07/23/19   Referral source: Evlyn Elspeth BROCKS, MD DOB: [birth year]   Medical/Treatment Diagnosis: Pain in right knee [M25.561]  Chondromalacia patellae, right knee [M22.41]  Unilateral primary osteoarthritis, right knee [M17.11]  Payor: VA MEDICARE / Plan: VA MEDICARE PART A & B / Product Type: Medicare /  Onset Date:06/27/19     Prior Hospitalization: see medical history Provider#: [id number]   Medications: Verified on Patient Summary List    Comorbidities: Arthritis; Depression; L/S DDD   Prior Level of Function: Lives in 1-story home alone with cat; enjoys working on cars; functionally independent with modifications    Visits from Start of Care: 3    Missed Visits: 3  Reporting Period : 07/23/19 to 07/30/19    Short Term Goals: To be accomplished in 1 weeks:  - Goal: Pt to be compliant with initial HEP to improve right LE strength and stability.  Status at last note/certification: Established and reviewed with Pt  Current: met, pt reports compliance  - Goal: Pt to initiate aquatics program without increased pain to aid in achievement of all LTGs.  Status at last note/certification: N/A  Current: met  Long Term Goals: To be accomplished in 5 weeks:  - Goal: Pt to increase right knee AROM to 0 to 130 deg or > to normalize gait pattern and increase ease of squatting.  Status at last note/certification: -6 to 115 deg with pain  Current: unable to reassess due to unexpected discharge  - Goal: Pt to increase right hip/knee strength to 4/5 grossly to reduce knee buckling and increase standing/amb tolerance.  Status at last note/certification: right hip/knee strength 3+/5 grossly except 4-/5 hip ER/IR  Current: unable to reassess due to unexpected discharge  - Goal: Pt to report < 4/10 pain at worst to increase ease with  ADLs.  Status at last note/certification: 9/10 pain at worst  Current: unable to reassess due to unexpected discharge  - Goal: Pt to report FOTO score of 48 pts to show improved function and quality of life.  Status at last note/certification: FOTO 34 pts   Current: unable to reassess due to unexpected discharge    Assessment/ Summary of Care: The pt attended three sessions of therapy for treatment of right knee pain. He was able to report some improvement in pain level temporarily with aquatic therapy but elected to discharge at this time due to difficulty arranging regular attendance at therapy.     RECOMMENDATIONS:  [x] Discontinue therapy: [] Patient has reached or is progressing toward set goals      [x] Patient is non-compliant or has abdicated      [] Due to lack of appreciable progress towards set goals    Donnice JONELLE Angle 08/09/2019 9:05 AM

## 2019-08-01 ENCOUNTER — Encounter: Payer: MEDICARE | Primary: Family Medicine

## 2019-08-06 ENCOUNTER — Encounter: Payer: MEDICARE | Primary: Family Medicine

## 2019-08-08 ENCOUNTER — Encounter: Payer: MEDICARE | Primary: Family Medicine

## 2019-08-13 ENCOUNTER — Encounter: Payer: MEDICARE | Primary: Family Medicine

## 2019-08-15 ENCOUNTER — Encounter: Payer: MEDICARE | Primary: Family Medicine

## 2019-08-20 ENCOUNTER — Encounter: Payer: MEDICARE | Primary: Family Medicine

## 2019-08-22 ENCOUNTER — Encounter: Payer: MEDICARE | Primary: Family Medicine

## 2019-12-10 DIAGNOSIS — M5441 Lumbago with sciatica, right side: Secondary | ICD-10-CM
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
## 2019-12-10 NOTE — ED Notes (Signed)
Jim Shaw fitted the patient with crutches the patient then promptly left.

## 2019-12-10 NOTE — ED Notes (Signed)
Patient armband removed and shredded
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
## 2019-12-11 ENCOUNTER — Emergency Department: Admit: 2019-12-11 | Payer: MEDICARE | Primary: Family Medicine

## 2019-12-11 ENCOUNTER — Inpatient Hospital Stay
Admit: 2019-12-11 | Discharge: 2019-12-11 | Disposition: A | Discharge status: Home / Self Care | Payer: MEDICARE | Attending: Emergency Medicine

## 2019-12-11 ENCOUNTER — Inpatient Hospital Stay: Admit: 2019-12-11 | Discharge: 2019-12-11 | Payer: MEDICARE | Attending: Emergency Medicine

## 2019-12-11 DIAGNOSIS — M5441 Lumbago with sciatica, right side: Secondary | ICD-10-CM

## 2019-12-11 MED ORDER — PREDNISONE 10 MG TABLETS IN A DOSE PACK
10 mg | ORAL_TABLET | ORAL | 0 refills | Status: AC
Start: 2019-12-11 — End: ?

## 2019-12-11 MED ORDER — LIDOCAINE 5 % TOPICAL CREAM
5 % | Freq: Two times a day (BID) | CUTANEOUS | 0 refills | Status: AC | PRN
Start: 2019-12-11 — End: ?

## 2019-12-11 MED ORDER — OXYCODONE-ACETAMINOPHEN 5 MG-325 MG TAB
5-325 mg | ORAL_TABLET | Freq: Three times a day (TID) | ORAL | 0 refills | Status: AC | PRN
Start: 2019-12-11 — End: 2019-12-12
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
